# Patient Record
Sex: Male | Born: 1940 | Race: White | Hispanic: No | Marital: Married | Smoking: Former smoker
Health system: Southern US, Community
[De-identification: ages and names within clinical notes are randomized; demographics above are authoritative.]

## PROBLEM LIST (undated history)

## (undated) DIAGNOSIS — N186 End stage renal disease: Secondary | ICD-10-CM

## (undated) DIAGNOSIS — K4090 Unilateral inguinal hernia, without obstruction or gangrene, not specified as recurrent: Secondary | ICD-10-CM

## (undated) DIAGNOSIS — M51369 Other intervertebral disc degeneration, lumbar region without mention of lumbar back pain or lower extremity pain: Secondary | ICD-10-CM

## (undated) DIAGNOSIS — R600 Localized edema: Secondary | ICD-10-CM

## (undated) DIAGNOSIS — I4892 Unspecified atrial flutter: Secondary | ICD-10-CM

## (undated) DIAGNOSIS — M5136 Other intervertebral disc degeneration, lumbar region: Secondary | ICD-10-CM

## (undated) DIAGNOSIS — I10 Essential (primary) hypertension: Secondary | ICD-10-CM

## (undated) DIAGNOSIS — M47816 Spondylosis without myelopathy or radiculopathy, lumbar region: Secondary | ICD-10-CM

## (undated) DIAGNOSIS — Z79899 Other long term (current) drug therapy: Secondary | ICD-10-CM

## (undated) DIAGNOSIS — Z7960 Long term (current) use of unspecified immunomodulators and immunosuppressants: Secondary | ICD-10-CM

## (undated) DIAGNOSIS — E785 Hyperlipidemia, unspecified: Secondary | ICD-10-CM

## (undated) DIAGNOSIS — E119 Type 2 diabetes mellitus without complications: Secondary | ICD-10-CM

## (undated) DIAGNOSIS — R609 Edema, unspecified: Secondary | ICD-10-CM

## (undated) DIAGNOSIS — I639 Cerebral infarction, unspecified: Secondary | ICD-10-CM

## (undated) HISTORY — PX: BACK SURGERY: SHX140

## (undated) HISTORY — PX: CHOLECYSTECTOMY: SHX55

---

## 2001-04-24 ENCOUNTER — Encounter (HOSPITAL_COMMUNITY): Admission: RE | Admit: 2001-04-24 | Discharge: 2001-05-22 | Payer: Self-pay | Admitting: Nephrology

## 2001-05-23 ENCOUNTER — Encounter (HOSPITAL_COMMUNITY): Admission: RE | Admit: 2001-05-23 | Discharge: 2001-06-22 | Payer: Self-pay | Admitting: Nephrology

## 2003-01-24 HISTORY — PX: KIDNEY TRANSPLANT: SHX239

## 2003-03-18 ENCOUNTER — Ambulatory Visit (HOSPITAL_COMMUNITY): Admission: RE | Admit: 2003-03-18 | Discharge: 2003-03-19 | Payer: Self-pay | Admitting: Cardiology

## 2004-11-24 ENCOUNTER — Ambulatory Visit: Payer: Self-pay | Admitting: Cardiology

## 2010-11-24 DIAGNOSIS — R0602 Shortness of breath: Secondary | ICD-10-CM

## 2011-02-01 DIAGNOSIS — N189 Chronic kidney disease, unspecified: Secondary | ICD-10-CM | POA: Diagnosis not present

## 2011-02-24 DIAGNOSIS — Z940 Kidney transplant status: Secondary | ICD-10-CM | POA: Diagnosis not present

## 2011-03-24 DIAGNOSIS — I129 Hypertensive chronic kidney disease with stage 1 through stage 4 chronic kidney disease, or unspecified chronic kidney disease: Secondary | ICD-10-CM | POA: Diagnosis not present

## 2011-03-24 DIAGNOSIS — Z862 Personal history of diseases of the blood and blood-forming organs and certain disorders involving the immune mechanism: Secondary | ICD-10-CM | POA: Diagnosis not present

## 2011-03-24 DIAGNOSIS — Z940 Kidney transplant status: Secondary | ICD-10-CM | POA: Diagnosis not present

## 2011-03-24 DIAGNOSIS — N183 Chronic kidney disease, stage 3 unspecified: Secondary | ICD-10-CM | POA: Diagnosis not present

## 2011-03-24 DIAGNOSIS — E119 Type 2 diabetes mellitus without complications: Secondary | ICD-10-CM | POA: Diagnosis not present

## 2011-03-24 DIAGNOSIS — Z8639 Personal history of other endocrine, nutritional and metabolic disease: Secondary | ICD-10-CM | POA: Diagnosis not present

## 2011-03-24 DIAGNOSIS — N281 Cyst of kidney, acquired: Secondary | ICD-10-CM | POA: Diagnosis not present

## 2011-03-24 DIAGNOSIS — E785 Hyperlipidemia, unspecified: Secondary | ICD-10-CM | POA: Diagnosis not present

## 2011-03-31 DIAGNOSIS — E1149 Type 2 diabetes mellitus with other diabetic neurological complication: Secondary | ICD-10-CM | POA: Diagnosis not present

## 2011-03-31 DIAGNOSIS — B351 Tinea unguium: Secondary | ICD-10-CM | POA: Diagnosis not present

## 2011-05-05 DIAGNOSIS — E782 Mixed hyperlipidemia: Secondary | ICD-10-CM | POA: Diagnosis not present

## 2011-05-05 DIAGNOSIS — G459 Transient cerebral ischemic attack, unspecified: Secondary | ICD-10-CM | POA: Diagnosis not present

## 2011-05-12 DIAGNOSIS — R209 Unspecified disturbances of skin sensation: Secondary | ICD-10-CM | POA: Diagnosis not present

## 2011-05-12 DIAGNOSIS — I6529 Occlusion and stenosis of unspecified carotid artery: Secondary | ICD-10-CM | POA: Diagnosis not present

## 2011-05-12 DIAGNOSIS — R42 Dizziness and giddiness: Secondary | ICD-10-CM | POA: Diagnosis not present

## 2011-05-14 DIAGNOSIS — G459 Transient cerebral ischemic attack, unspecified: Secondary | ICD-10-CM | POA: Diagnosis not present

## 2011-05-14 DIAGNOSIS — I209 Angina pectoris, unspecified: Secondary | ICD-10-CM | POA: Diagnosis present

## 2011-05-14 DIAGNOSIS — I4892 Unspecified atrial flutter: Secondary | ICD-10-CM | POA: Diagnosis not present

## 2011-05-14 DIAGNOSIS — I10 Essential (primary) hypertension: Secondary | ICD-10-CM | POA: Diagnosis not present

## 2011-05-14 DIAGNOSIS — Z794 Long term (current) use of insulin: Secondary | ICD-10-CM | POA: Diagnosis not present

## 2011-05-14 DIAGNOSIS — I2510 Atherosclerotic heart disease of native coronary artery without angina pectoris: Secondary | ICD-10-CM | POA: Diagnosis not present

## 2011-05-14 DIAGNOSIS — R42 Dizziness and giddiness: Secondary | ICD-10-CM | POA: Diagnosis not present

## 2011-05-14 DIAGNOSIS — Z880 Allergy status to penicillin: Secondary | ICD-10-CM | POA: Diagnosis not present

## 2011-05-14 DIAGNOSIS — Z79899 Other long term (current) drug therapy: Secondary | ICD-10-CM | POA: Diagnosis not present

## 2011-05-14 DIAGNOSIS — E785 Hyperlipidemia, unspecified: Secondary | ICD-10-CM | POA: Diagnosis present

## 2011-05-14 DIAGNOSIS — I499 Cardiac arrhythmia, unspecified: Secondary | ICD-10-CM | POA: Diagnosis not present

## 2011-05-14 DIAGNOSIS — Z7982 Long term (current) use of aspirin: Secondary | ICD-10-CM | POA: Diagnosis not present

## 2011-05-14 DIAGNOSIS — G319 Degenerative disease of nervous system, unspecified: Secondary | ICD-10-CM | POA: Diagnosis not present

## 2011-05-14 DIAGNOSIS — Z940 Kidney transplant status: Secondary | ICD-10-CM | POA: Diagnosis not present

## 2011-05-14 DIAGNOSIS — E119 Type 2 diabetes mellitus without complications: Secondary | ICD-10-CM | POA: Diagnosis not present

## 2011-05-14 DIAGNOSIS — Z9861 Coronary angioplasty status: Secondary | ICD-10-CM | POA: Diagnosis not present

## 2011-05-14 DIAGNOSIS — Z8673 Personal history of transient ischemic attack (TIA), and cerebral infarction without residual deficits: Secondary | ICD-10-CM | POA: Diagnosis not present

## 2011-05-14 DIAGNOSIS — I498 Other specified cardiac arrhythmias: Secondary | ICD-10-CM | POA: Diagnosis not present

## 2011-05-17 DIAGNOSIS — I4892 Unspecified atrial flutter: Secondary | ICD-10-CM | POA: Diagnosis not present

## 2011-05-19 DIAGNOSIS — Z7901 Long term (current) use of anticoagulants: Secondary | ICD-10-CM | POA: Diagnosis not present

## 2011-05-26 DIAGNOSIS — I4892 Unspecified atrial flutter: Secondary | ICD-10-CM | POA: Diagnosis not present

## 2011-05-31 DIAGNOSIS — I4892 Unspecified atrial flutter: Secondary | ICD-10-CM | POA: Diagnosis not present

## 2011-05-31 DIAGNOSIS — E785 Hyperlipidemia, unspecified: Secondary | ICD-10-CM | POA: Diagnosis not present

## 2011-05-31 DIAGNOSIS — E119 Type 2 diabetes mellitus without complications: Secondary | ICD-10-CM | POA: Diagnosis not present

## 2011-05-31 DIAGNOSIS — I129 Hypertensive chronic kidney disease with stage 1 through stage 4 chronic kidney disease, or unspecified chronic kidney disease: Secondary | ICD-10-CM | POA: Diagnosis not present

## 2011-06-02 DIAGNOSIS — I4891 Unspecified atrial fibrillation: Secondary | ICD-10-CM | POA: Diagnosis not present

## 2011-06-09 DIAGNOSIS — E1149 Type 2 diabetes mellitus with other diabetic neurological complication: Secondary | ICD-10-CM | POA: Diagnosis not present

## 2011-06-09 DIAGNOSIS — B351 Tinea unguium: Secondary | ICD-10-CM | POA: Diagnosis not present

## 2011-07-10 DIAGNOSIS — E785 Hyperlipidemia, unspecified: Secondary | ICD-10-CM | POA: Diagnosis not present

## 2011-07-10 DIAGNOSIS — I4892 Unspecified atrial flutter: Secondary | ICD-10-CM | POA: Diagnosis not present

## 2011-07-10 DIAGNOSIS — I2510 Atherosclerotic heart disease of native coronary artery without angina pectoris: Secondary | ICD-10-CM | POA: Diagnosis not present

## 2011-07-10 DIAGNOSIS — E119 Type 2 diabetes mellitus without complications: Secondary | ICD-10-CM | POA: Diagnosis not present

## 2011-07-10 DIAGNOSIS — Z940 Kidney transplant status: Secondary | ICD-10-CM | POA: Diagnosis not present

## 2011-07-10 DIAGNOSIS — I129 Hypertensive chronic kidney disease with stage 1 through stage 4 chronic kidney disease, or unspecified chronic kidney disease: Secondary | ICD-10-CM | POA: Diagnosis not present

## 2011-07-10 DIAGNOSIS — Z8673 Personal history of transient ischemic attack (TIA), and cerebral infarction without residual deficits: Secondary | ICD-10-CM | POA: Diagnosis not present

## 2011-08-11 DIAGNOSIS — I10 Essential (primary) hypertension: Secondary | ICD-10-CM | POA: Diagnosis not present

## 2011-09-01 DIAGNOSIS — E1149 Type 2 diabetes mellitus with other diabetic neurological complication: Secondary | ICD-10-CM | POA: Diagnosis not present

## 2011-09-01 DIAGNOSIS — B351 Tinea unguium: Secondary | ICD-10-CM | POA: Diagnosis not present

## 2011-11-17 DIAGNOSIS — B079 Viral wart, unspecified: Secondary | ICD-10-CM | POA: Diagnosis not present

## 2011-11-22 DIAGNOSIS — B351 Tinea unguium: Secondary | ICD-10-CM | POA: Diagnosis not present

## 2011-11-22 DIAGNOSIS — E1149 Type 2 diabetes mellitus with other diabetic neurological complication: Secondary | ICD-10-CM | POA: Diagnosis not present

## 2011-12-04 DIAGNOSIS — N281 Cyst of kidney, acquired: Secondary | ICD-10-CM | POA: Diagnosis not present

## 2011-12-04 DIAGNOSIS — I2510 Atherosclerotic heart disease of native coronary artery without angina pectoris: Secondary | ICD-10-CM | POA: Diagnosis not present

## 2011-12-04 DIAGNOSIS — Z862 Personal history of diseases of the blood and blood-forming organs and certain disorders involving the immune mechanism: Secondary | ICD-10-CM | POA: Diagnosis not present

## 2011-12-04 DIAGNOSIS — E785 Hyperlipidemia, unspecified: Secondary | ICD-10-CM | POA: Diagnosis not present

## 2011-12-04 DIAGNOSIS — Z8673 Personal history of transient ischemic attack (TIA), and cerebral infarction without residual deficits: Secondary | ICD-10-CM | POA: Diagnosis not present

## 2011-12-04 DIAGNOSIS — E119 Type 2 diabetes mellitus without complications: Secondary | ICD-10-CM | POA: Diagnosis not present

## 2011-12-04 DIAGNOSIS — N183 Chronic kidney disease, stage 3 unspecified: Secondary | ICD-10-CM | POA: Diagnosis not present

## 2011-12-04 DIAGNOSIS — I129 Hypertensive chronic kidney disease with stage 1 through stage 4 chronic kidney disease, or unspecified chronic kidney disease: Secondary | ICD-10-CM | POA: Diagnosis not present

## 2011-12-04 DIAGNOSIS — Z79899 Other long term (current) drug therapy: Secondary | ICD-10-CM | POA: Diagnosis not present

## 2011-12-04 DIAGNOSIS — Z940 Kidney transplant status: Secondary | ICD-10-CM | POA: Diagnosis not present

## 2011-12-19 DIAGNOSIS — Z23 Encounter for immunization: Secondary | ICD-10-CM | POA: Diagnosis not present

## 2012-01-16 DIAGNOSIS — Z48298 Encounter for aftercare following other organ transplant: Secondary | ICD-10-CM | POA: Diagnosis not present

## 2012-01-16 DIAGNOSIS — E119 Type 2 diabetes mellitus without complications: Secondary | ICD-10-CM | POA: Diagnosis not present

## 2012-01-16 DIAGNOSIS — I129 Hypertensive chronic kidney disease with stage 1 through stage 4 chronic kidney disease, or unspecified chronic kidney disease: Secondary | ICD-10-CM | POA: Diagnosis not present

## 2012-01-16 DIAGNOSIS — N183 Chronic kidney disease, stage 3 unspecified: Secondary | ICD-10-CM | POA: Diagnosis not present

## 2012-01-16 DIAGNOSIS — Z940 Kidney transplant status: Secondary | ICD-10-CM | POA: Diagnosis not present

## 2012-01-16 DIAGNOSIS — K439 Ventral hernia without obstruction or gangrene: Secondary | ICD-10-CM | POA: Diagnosis not present

## 2012-01-31 DIAGNOSIS — E1149 Type 2 diabetes mellitus with other diabetic neurological complication: Secondary | ICD-10-CM | POA: Diagnosis not present

## 2012-01-31 DIAGNOSIS — B351 Tinea unguium: Secondary | ICD-10-CM | POA: Diagnosis not present

## 2012-02-08 DIAGNOSIS — K432 Incisional hernia without obstruction or gangrene: Secondary | ICD-10-CM | POA: Diagnosis not present

## 2012-03-05 DIAGNOSIS — B351 Tinea unguium: Secondary | ICD-10-CM | POA: Diagnosis not present

## 2012-03-05 DIAGNOSIS — E1149 Type 2 diabetes mellitus with other diabetic neurological complication: Secondary | ICD-10-CM | POA: Diagnosis not present

## 2012-03-20 DIAGNOSIS — Z79899 Other long term (current) drug therapy: Secondary | ICD-10-CM | POA: Diagnosis not present

## 2012-03-20 DIAGNOSIS — I10 Essential (primary) hypertension: Secondary | ICD-10-CM | POA: Diagnosis not present

## 2012-04-04 DIAGNOSIS — Z940 Kidney transplant status: Secondary | ICD-10-CM | POA: Diagnosis not present

## 2012-04-04 DIAGNOSIS — Z862 Personal history of diseases of the blood and blood-forming organs and certain disorders involving the immune mechanism: Secondary | ICD-10-CM | POA: Diagnosis not present

## 2012-04-04 DIAGNOSIS — E119 Type 2 diabetes mellitus without complications: Secondary | ICD-10-CM | POA: Diagnosis not present

## 2012-04-04 DIAGNOSIS — Z8639 Personal history of other endocrine, nutritional and metabolic disease: Secondary | ICD-10-CM | POA: Diagnosis not present

## 2012-04-04 DIAGNOSIS — I129 Hypertensive chronic kidney disease with stage 1 through stage 4 chronic kidney disease, or unspecified chronic kidney disease: Secondary | ICD-10-CM | POA: Diagnosis not present

## 2012-04-04 DIAGNOSIS — N281 Cyst of kidney, acquired: Secondary | ICD-10-CM | POA: Diagnosis not present

## 2012-04-04 DIAGNOSIS — Z79899 Other long term (current) drug therapy: Secondary | ICD-10-CM | POA: Diagnosis not present

## 2012-04-04 DIAGNOSIS — E785 Hyperlipidemia, unspecified: Secondary | ICD-10-CM | POA: Diagnosis not present

## 2012-04-04 DIAGNOSIS — N183 Chronic kidney disease, stage 3 unspecified: Secondary | ICD-10-CM | POA: Diagnosis not present

## 2012-05-06 DIAGNOSIS — Z0000 Encounter for general adult medical examination without abnormal findings: Secondary | ICD-10-CM | POA: Diagnosis not present

## 2012-05-06 DIAGNOSIS — Z125 Encounter for screening for malignant neoplasm of prostate: Secondary | ICD-10-CM | POA: Diagnosis not present

## 2012-06-10 DIAGNOSIS — E1149 Type 2 diabetes mellitus with other diabetic neurological complication: Secondary | ICD-10-CM | POA: Diagnosis not present

## 2012-06-21 DIAGNOSIS — I509 Heart failure, unspecified: Secondary | ICD-10-CM | POA: Diagnosis not present

## 2012-06-26 DIAGNOSIS — I10 Essential (primary) hypertension: Secondary | ICD-10-CM | POA: Diagnosis not present

## 2012-06-26 DIAGNOSIS — M79609 Pain in unspecified limb: Secondary | ICD-10-CM | POA: Diagnosis not present

## 2012-07-04 DIAGNOSIS — I359 Nonrheumatic aortic valve disorder, unspecified: Secondary | ICD-10-CM | POA: Diagnosis not present

## 2012-07-04 DIAGNOSIS — R0609 Other forms of dyspnea: Secondary | ICD-10-CM | POA: Diagnosis not present

## 2012-07-04 DIAGNOSIS — I77819 Aortic ectasia, unspecified site: Secondary | ICD-10-CM | POA: Diagnosis not present

## 2012-07-04 DIAGNOSIS — R0602 Shortness of breath: Secondary | ICD-10-CM | POA: Diagnosis not present

## 2012-07-04 DIAGNOSIS — M79609 Pain in unspecified limb: Secondary | ICD-10-CM | POA: Diagnosis not present

## 2012-07-04 DIAGNOSIS — I998 Other disorder of circulatory system: Secondary | ICD-10-CM | POA: Diagnosis not present

## 2012-08-26 DIAGNOSIS — R5381 Other malaise: Secondary | ICD-10-CM | POA: Diagnosis not present

## 2012-08-26 DIAGNOSIS — R5382 Chronic fatigue, unspecified: Secondary | ICD-10-CM | POA: Diagnosis not present

## 2012-08-26 DIAGNOSIS — I10 Essential (primary) hypertension: Secondary | ICD-10-CM | POA: Diagnosis not present

## 2012-08-26 DIAGNOSIS — R5383 Other fatigue: Secondary | ICD-10-CM | POA: Diagnosis not present

## 2012-08-27 DIAGNOSIS — Z880 Allergy status to penicillin: Secondary | ICD-10-CM | POA: Diagnosis not present

## 2012-08-27 DIAGNOSIS — Z7901 Long term (current) use of anticoagulants: Secondary | ICD-10-CM | POA: Diagnosis not present

## 2012-08-27 DIAGNOSIS — R42 Dizziness and giddiness: Secondary | ICD-10-CM | POA: Diagnosis not present

## 2012-08-27 DIAGNOSIS — E785 Hyperlipidemia, unspecified: Secondary | ICD-10-CM | POA: Diagnosis not present

## 2012-08-27 DIAGNOSIS — I10 Essential (primary) hypertension: Secondary | ICD-10-CM | POA: Diagnosis not present

## 2012-08-27 DIAGNOSIS — Z7982 Long term (current) use of aspirin: Secondary | ICD-10-CM | POA: Diagnosis not present

## 2012-08-27 DIAGNOSIS — Z794 Long term (current) use of insulin: Secondary | ICD-10-CM | POA: Diagnosis not present

## 2012-08-27 DIAGNOSIS — I2510 Atherosclerotic heart disease of native coronary artery without angina pectoris: Secondary | ICD-10-CM | POA: Diagnosis not present

## 2012-08-27 DIAGNOSIS — Z9861 Coronary angioplasty status: Secondary | ICD-10-CM | POA: Diagnosis not present

## 2012-08-27 DIAGNOSIS — E860 Dehydration: Secondary | ICD-10-CM | POA: Diagnosis not present

## 2012-08-27 DIAGNOSIS — Z86718 Personal history of other venous thrombosis and embolism: Secondary | ICD-10-CM | POA: Diagnosis not present

## 2012-08-27 DIAGNOSIS — Z8249 Family history of ischemic heart disease and other diseases of the circulatory system: Secondary | ICD-10-CM | POA: Diagnosis not present

## 2012-08-27 DIAGNOSIS — E119 Type 2 diabetes mellitus without complications: Secondary | ICD-10-CM | POA: Diagnosis not present

## 2012-08-27 DIAGNOSIS — R55 Syncope and collapse: Secondary | ICD-10-CM | POA: Diagnosis not present

## 2012-08-27 DIAGNOSIS — Z940 Kidney transplant status: Secondary | ICD-10-CM | POA: Diagnosis not present

## 2012-08-27 DIAGNOSIS — Z79899 Other long term (current) drug therapy: Secondary | ICD-10-CM | POA: Diagnosis not present

## 2012-08-28 DIAGNOSIS — E119 Type 2 diabetes mellitus without complications: Secondary | ICD-10-CM | POA: Diagnosis not present

## 2012-08-28 DIAGNOSIS — R42 Dizziness and giddiness: Secondary | ICD-10-CM | POA: Diagnosis not present

## 2012-08-28 DIAGNOSIS — I10 Essential (primary) hypertension: Secondary | ICD-10-CM | POA: Diagnosis not present

## 2012-08-28 DIAGNOSIS — E785 Hyperlipidemia, unspecified: Secondary | ICD-10-CM | POA: Diagnosis not present

## 2012-08-28 DIAGNOSIS — I2510 Atherosclerotic heart disease of native coronary artery without angina pectoris: Secondary | ICD-10-CM | POA: Diagnosis not present

## 2012-08-28 DIAGNOSIS — E860 Dehydration: Secondary | ICD-10-CM | POA: Diagnosis not present

## 2012-08-28 DIAGNOSIS — Z86718 Personal history of other venous thrombosis and embolism: Secondary | ICD-10-CM | POA: Diagnosis not present

## 2012-09-02 DIAGNOSIS — N039 Chronic nephritic syndrome with unspecified morphologic changes: Secondary | ICD-10-CM | POA: Diagnosis not present

## 2012-09-02 DIAGNOSIS — N2581 Secondary hyperparathyroidism of renal origin: Secondary | ICD-10-CM | POA: Diagnosis not present

## 2012-09-02 DIAGNOSIS — N183 Chronic kidney disease, stage 3 unspecified: Secondary | ICD-10-CM | POA: Diagnosis not present

## 2012-09-02 DIAGNOSIS — E785 Hyperlipidemia, unspecified: Secondary | ICD-10-CM | POA: Diagnosis not present

## 2012-09-02 DIAGNOSIS — I129 Hypertensive chronic kidney disease with stage 1 through stage 4 chronic kidney disease, or unspecified chronic kidney disease: Secondary | ICD-10-CM | POA: Diagnosis not present

## 2012-09-02 DIAGNOSIS — D631 Anemia in chronic kidney disease: Secondary | ICD-10-CM | POA: Diagnosis not present

## 2012-09-02 DIAGNOSIS — E119 Type 2 diabetes mellitus without complications: Secondary | ICD-10-CM | POA: Diagnosis not present

## 2012-09-02 DIAGNOSIS — Z79899 Other long term (current) drug therapy: Secondary | ICD-10-CM | POA: Diagnosis not present

## 2012-09-02 DIAGNOSIS — N281 Cyst of kidney, acquired: Secondary | ICD-10-CM | POA: Diagnosis not present

## 2012-09-02 DIAGNOSIS — Z940 Kidney transplant status: Secondary | ICD-10-CM | POA: Diagnosis not present

## 2012-09-12 DIAGNOSIS — I10 Essential (primary) hypertension: Secondary | ICD-10-CM | POA: Diagnosis not present

## 2012-11-04 DIAGNOSIS — I10 Essential (primary) hypertension: Secondary | ICD-10-CM | POA: Diagnosis not present

## 2012-11-04 DIAGNOSIS — B372 Candidiasis of skin and nail: Secondary | ICD-10-CM | POA: Diagnosis not present

## 2012-11-13 DIAGNOSIS — M47817 Spondylosis without myelopathy or radiculopathy, lumbosacral region: Secondary | ICD-10-CM | POA: Diagnosis not present

## 2012-11-13 DIAGNOSIS — Z981 Arthrodesis status: Secondary | ICD-10-CM | POA: Diagnosis not present

## 2012-11-13 DIAGNOSIS — M4120 Other idiopathic scoliosis, site unspecified: Secondary | ICD-10-CM | POA: Diagnosis not present

## 2013-01-01 DIAGNOSIS — S4980XA Other specified injuries of shoulder and upper arm, unspecified arm, initial encounter: Secondary | ICD-10-CM | POA: Diagnosis not present

## 2013-01-01 DIAGNOSIS — G319 Degenerative disease of nervous system, unspecified: Secondary | ICD-10-CM | POA: Diagnosis not present

## 2013-01-01 DIAGNOSIS — W108XXA Fall (on) (from) other stairs and steps, initial encounter: Secondary | ICD-10-CM | POA: Diagnosis not present

## 2013-01-01 DIAGNOSIS — M5030 Other cervical disc degeneration, unspecified cervical region: Secondary | ICD-10-CM | POA: Diagnosis not present

## 2013-01-01 DIAGNOSIS — S0993XA Unspecified injury of face, initial encounter: Secondary | ICD-10-CM | POA: Diagnosis not present

## 2013-01-01 DIAGNOSIS — M79609 Pain in unspecified limb: Secondary | ICD-10-CM | POA: Diagnosis not present

## 2013-01-01 DIAGNOSIS — S3981XA Other specified injuries of abdomen, initial encounter: Secondary | ICD-10-CM | POA: Diagnosis not present

## 2013-01-01 DIAGNOSIS — S2249XA Multiple fractures of ribs, unspecified side, initial encounter for closed fracture: Secondary | ICD-10-CM | POA: Diagnosis not present

## 2013-01-01 DIAGNOSIS — S0990XA Unspecified injury of head, initial encounter: Secondary | ICD-10-CM | POA: Diagnosis not present

## 2013-01-01 DIAGNOSIS — I10 Essential (primary) hypertension: Secondary | ICD-10-CM | POA: Diagnosis not present

## 2013-01-01 DIAGNOSIS — Z87891 Personal history of nicotine dependence: Secondary | ICD-10-CM | POA: Diagnosis not present

## 2013-01-01 DIAGNOSIS — Z940 Kidney transplant status: Secondary | ICD-10-CM | POA: Diagnosis not present

## 2013-01-01 DIAGNOSIS — Z8249 Family history of ischemic heart disease and other diseases of the circulatory system: Secondary | ICD-10-CM | POA: Diagnosis not present

## 2013-01-01 DIAGNOSIS — Z833 Family history of diabetes mellitus: Secondary | ICD-10-CM | POA: Diagnosis not present

## 2013-01-01 DIAGNOSIS — R6889 Other general symptoms and signs: Secondary | ICD-10-CM | POA: Diagnosis not present

## 2013-01-01 DIAGNOSIS — S59909A Unspecified injury of unspecified elbow, initial encounter: Secondary | ICD-10-CM | POA: Diagnosis not present

## 2013-01-01 DIAGNOSIS — R109 Unspecified abdominal pain: Secondary | ICD-10-CM | POA: Diagnosis not present

## 2013-01-01 DIAGNOSIS — Z794 Long term (current) use of insulin: Secondary | ICD-10-CM | POA: Diagnosis not present

## 2013-01-01 DIAGNOSIS — E119 Type 2 diabetes mellitus without complications: Secondary | ICD-10-CM | POA: Diagnosis not present

## 2013-01-01 DIAGNOSIS — IMO0002 Reserved for concepts with insufficient information to code with codable children: Secondary | ICD-10-CM | POA: Diagnosis not present

## 2013-01-01 DIAGNOSIS — Z79899 Other long term (current) drug therapy: Secondary | ICD-10-CM | POA: Diagnosis not present

## 2013-01-01 DIAGNOSIS — Z8673 Personal history of transient ischemic attack (TIA), and cerebral infarction without residual deficits: Secondary | ICD-10-CM | POA: Diagnosis not present

## 2013-01-01 DIAGNOSIS — M25519 Pain in unspecified shoulder: Secondary | ICD-10-CM | POA: Diagnosis not present

## 2013-01-01 DIAGNOSIS — Z7982 Long term (current) use of aspirin: Secondary | ICD-10-CM | POA: Diagnosis not present

## 2013-01-01 DIAGNOSIS — M542 Cervicalgia: Secondary | ICD-10-CM | POA: Diagnosis not present

## 2013-01-01 DIAGNOSIS — I2510 Atherosclerotic heart disease of native coronary artery without angina pectoris: Secondary | ICD-10-CM | POA: Diagnosis not present

## 2013-01-02 DIAGNOSIS — S2249XA Multiple fractures of ribs, unspecified side, initial encounter for closed fracture: Secondary | ICD-10-CM | POA: Diagnosis not present

## 2013-01-09 DIAGNOSIS — S20219A Contusion of unspecified front wall of thorax, initial encounter: Secondary | ICD-10-CM | POA: Diagnosis not present

## 2013-02-04 DIAGNOSIS — E1149 Type 2 diabetes mellitus with other diabetic neurological complication: Secondary | ICD-10-CM | POA: Diagnosis not present

## 2013-02-06 DIAGNOSIS — N183 Chronic kidney disease, stage 3 unspecified: Secondary | ICD-10-CM | POA: Diagnosis not present

## 2013-02-06 DIAGNOSIS — E119 Type 2 diabetes mellitus without complications: Secondary | ICD-10-CM | POA: Diagnosis not present

## 2013-02-06 DIAGNOSIS — Z940 Kidney transplant status: Secondary | ICD-10-CM | POA: Diagnosis not present

## 2013-02-06 DIAGNOSIS — Z79899 Other long term (current) drug therapy: Secondary | ICD-10-CM | POA: Diagnosis not present

## 2013-02-06 DIAGNOSIS — Z862 Personal history of diseases of the blood and blood-forming organs and certain disorders involving the immune mechanism: Secondary | ICD-10-CM | POA: Diagnosis not present

## 2013-02-06 DIAGNOSIS — E785 Hyperlipidemia, unspecified: Secondary | ICD-10-CM | POA: Diagnosis not present

## 2013-02-06 DIAGNOSIS — I129 Hypertensive chronic kidney disease with stage 1 through stage 4 chronic kidney disease, or unspecified chronic kidney disease: Secondary | ICD-10-CM | POA: Diagnosis not present

## 2013-02-06 DIAGNOSIS — N281 Cyst of kidney, acquired: Secondary | ICD-10-CM | POA: Diagnosis not present

## 2013-02-14 DIAGNOSIS — E785 Hyperlipidemia, unspecified: Secondary | ICD-10-CM | POA: Diagnosis not present

## 2013-02-14 DIAGNOSIS — Z79899 Other long term (current) drug therapy: Secondary | ICD-10-CM | POA: Diagnosis not present

## 2013-02-14 DIAGNOSIS — Z9889 Other specified postprocedural states: Secondary | ICD-10-CM | POA: Diagnosis not present

## 2013-02-14 DIAGNOSIS — Z794 Long term (current) use of insulin: Secondary | ICD-10-CM | POA: Diagnosis not present

## 2013-02-14 DIAGNOSIS — E119 Type 2 diabetes mellitus without complications: Secondary | ICD-10-CM | POA: Diagnosis not present

## 2013-02-14 DIAGNOSIS — Z8673 Personal history of transient ischemic attack (TIA), and cerebral infarction without residual deficits: Secondary | ICD-10-CM | POA: Diagnosis not present

## 2013-02-14 DIAGNOSIS — N186 End stage renal disease: Secondary | ICD-10-CM | POA: Diagnosis not present

## 2013-02-14 DIAGNOSIS — I2510 Atherosclerotic heart disease of native coronary artery without angina pectoris: Secondary | ICD-10-CM | POA: Diagnosis not present

## 2013-02-14 DIAGNOSIS — N189 Chronic kidney disease, unspecified: Secondary | ICD-10-CM | POA: Diagnosis not present

## 2013-02-14 DIAGNOSIS — I120 Hypertensive chronic kidney disease with stage 5 chronic kidney disease or end stage renal disease: Secondary | ICD-10-CM | POA: Diagnosis not present

## 2013-02-14 DIAGNOSIS — Z940 Kidney transplant status: Secondary | ICD-10-CM | POA: Diagnosis not present

## 2013-02-14 DIAGNOSIS — D899 Disorder involving the immune mechanism, unspecified: Secondary | ICD-10-CM | POA: Diagnosis not present

## 2013-02-14 DIAGNOSIS — Z7982 Long term (current) use of aspirin: Secondary | ICD-10-CM | POA: Diagnosis not present

## 2013-02-14 DIAGNOSIS — Z9861 Coronary angioplasty status: Secondary | ICD-10-CM | POA: Diagnosis not present

## 2013-02-18 DIAGNOSIS — Z940 Kidney transplant status: Secondary | ICD-10-CM | POA: Diagnosis not present

## 2013-02-18 DIAGNOSIS — E1129 Type 2 diabetes mellitus with other diabetic kidney complication: Secondary | ICD-10-CM | POA: Diagnosis not present

## 2013-02-18 DIAGNOSIS — I10 Essential (primary) hypertension: Secondary | ICD-10-CM | POA: Diagnosis not present

## 2013-02-18 DIAGNOSIS — Z7982 Long term (current) use of aspirin: Secondary | ICD-10-CM | POA: Diagnosis not present

## 2013-02-18 DIAGNOSIS — IMO0002 Reserved for concepts with insufficient information to code with codable children: Secondary | ICD-10-CM | POA: Diagnosis not present

## 2013-02-18 DIAGNOSIS — Z880 Allergy status to penicillin: Secondary | ICD-10-CM | POA: Diagnosis not present

## 2013-02-18 DIAGNOSIS — Z79899 Other long term (current) drug therapy: Secondary | ICD-10-CM | POA: Diagnosis not present

## 2013-02-18 DIAGNOSIS — E119 Type 2 diabetes mellitus without complications: Secondary | ICD-10-CM | POA: Diagnosis not present

## 2013-02-18 DIAGNOSIS — Z9109 Other allergy status, other than to drugs and biological substances: Secondary | ICD-10-CM | POA: Diagnosis not present

## 2013-02-18 DIAGNOSIS — Z8673 Personal history of transient ischemic attack (TIA), and cerebral infarction without residual deficits: Secondary | ICD-10-CM | POA: Diagnosis not present

## 2013-02-18 DIAGNOSIS — N049 Nephrotic syndrome with unspecified morphologic changes: Secondary | ICD-10-CM | POA: Diagnosis not present

## 2013-02-18 DIAGNOSIS — Z87891 Personal history of nicotine dependence: Secondary | ICD-10-CM | POA: Diagnosis not present

## 2013-02-18 DIAGNOSIS — Z5181 Encounter for therapeutic drug level monitoring: Secondary | ICD-10-CM | POA: Diagnosis not present

## 2013-02-18 DIAGNOSIS — Z794 Long term (current) use of insulin: Secondary | ICD-10-CM | POA: Diagnosis not present

## 2013-02-18 DIAGNOSIS — Z48298 Encounter for aftercare following other organ transplant: Secondary | ICD-10-CM | POA: Diagnosis not present

## 2013-04-15 DIAGNOSIS — B351 Tinea unguium: Secondary | ICD-10-CM | POA: Diagnosis not present

## 2013-04-15 DIAGNOSIS — E1149 Type 2 diabetes mellitus with other diabetic neurological complication: Secondary | ICD-10-CM | POA: Diagnosis not present

## 2013-04-15 DIAGNOSIS — L851 Acquired keratosis [keratoderma] palmaris et plantaris: Secondary | ICD-10-CM | POA: Diagnosis not present

## 2013-04-21 DIAGNOSIS — Z940 Kidney transplant status: Secondary | ICD-10-CM | POA: Diagnosis not present

## 2013-04-21 DIAGNOSIS — N179 Acute kidney failure, unspecified: Secondary | ICD-10-CM | POA: Diagnosis not present

## 2013-04-21 DIAGNOSIS — R109 Unspecified abdominal pain: Secondary | ICD-10-CM | POA: Diagnosis not present

## 2013-04-21 DIAGNOSIS — A4152 Sepsis due to Pseudomonas: Secondary | ICD-10-CM | POA: Diagnosis not present

## 2013-04-21 DIAGNOSIS — N186 End stage renal disease: Secondary | ICD-10-CM | POA: Diagnosis not present

## 2013-04-21 DIAGNOSIS — Z794 Long term (current) use of insulin: Secondary | ICD-10-CM | POA: Diagnosis not present

## 2013-04-21 DIAGNOSIS — K432 Incisional hernia without obstruction or gangrene: Secondary | ICD-10-CM | POA: Diagnosis present

## 2013-04-21 DIAGNOSIS — E46 Unspecified protein-calorie malnutrition: Secondary | ICD-10-CM | POA: Diagnosis not present

## 2013-04-21 DIAGNOSIS — R0602 Shortness of breath: Secondary | ICD-10-CM | POA: Diagnosis not present

## 2013-04-21 DIAGNOSIS — G0490 Encephalitis and encephalomyelitis, unspecified: Secondary | ICD-10-CM | POA: Diagnosis not present

## 2013-04-21 DIAGNOSIS — Z5189 Encounter for other specified aftercare: Secondary | ICD-10-CM | POA: Diagnosis not present

## 2013-04-21 DIAGNOSIS — R9431 Abnormal electrocardiogram [ECG] [EKG]: Secondary | ICD-10-CM | POA: Diagnosis not present

## 2013-04-21 DIAGNOSIS — I498 Other specified cardiac arrhythmias: Secondary | ICD-10-CM | POA: Diagnosis not present

## 2013-04-21 DIAGNOSIS — E872 Acidosis, unspecified: Secondary | ICD-10-CM | POA: Diagnosis not present

## 2013-04-21 DIAGNOSIS — E559 Vitamin D deficiency, unspecified: Secondary | ICD-10-CM | POA: Diagnosis not present

## 2013-04-21 DIAGNOSIS — Z87891 Personal history of nicotine dependence: Secondary | ICD-10-CM | POA: Diagnosis not present

## 2013-04-21 DIAGNOSIS — Z79899 Other long term (current) drug therapy: Secondary | ICD-10-CM | POA: Diagnosis not present

## 2013-04-21 DIAGNOSIS — I2510 Atherosclerotic heart disease of native coronary artery without angina pectoris: Secondary | ICD-10-CM | POA: Diagnosis not present

## 2013-04-21 DIAGNOSIS — N183 Chronic kidney disease, stage 3 unspecified: Secondary | ICD-10-CM | POA: Diagnosis not present

## 2013-04-21 DIAGNOSIS — Z7982 Long term (current) use of aspirin: Secondary | ICD-10-CM | POA: Diagnosis not present

## 2013-04-21 DIAGNOSIS — R4182 Altered mental status, unspecified: Secondary | ICD-10-CM | POA: Diagnosis not present

## 2013-04-21 DIAGNOSIS — D649 Anemia, unspecified: Secondary | ICD-10-CM | POA: Diagnosis not present

## 2013-04-21 DIAGNOSIS — A4150 Gram-negative sepsis, unspecified: Secondary | ICD-10-CM | POA: Diagnosis not present

## 2013-04-21 DIAGNOSIS — R569 Unspecified convulsions: Secondary | ICD-10-CM | POA: Diagnosis not present

## 2013-04-21 DIAGNOSIS — R339 Retention of urine, unspecified: Secondary | ICD-10-CM | POA: Diagnosis not present

## 2013-04-21 DIAGNOSIS — I10 Essential (primary) hypertension: Secondary | ICD-10-CM | POA: Diagnosis not present

## 2013-04-21 DIAGNOSIS — N289 Disorder of kidney and ureter, unspecified: Secondary | ICD-10-CM | POA: Diagnosis not present

## 2013-04-21 DIAGNOSIS — N058 Unspecified nephritic syndrome with other morphologic changes: Secondary | ICD-10-CM | POA: Diagnosis not present

## 2013-04-21 DIAGNOSIS — E8779 Other fluid overload: Secondary | ICD-10-CM | POA: Diagnosis not present

## 2013-04-21 DIAGNOSIS — G039 Meningitis, unspecified: Secondary | ICD-10-CM | POA: Diagnosis not present

## 2013-04-21 DIAGNOSIS — N170 Acute kidney failure with tubular necrosis: Secondary | ICD-10-CM | POA: Diagnosis not present

## 2013-04-21 DIAGNOSIS — J811 Chronic pulmonary edema: Secondary | ICD-10-CM | POA: Diagnosis not present

## 2013-04-21 DIAGNOSIS — G9340 Encephalopathy, unspecified: Secondary | ICD-10-CM | POA: Diagnosis not present

## 2013-04-21 DIAGNOSIS — R279 Unspecified lack of coordination: Secondary | ICD-10-CM | POA: Diagnosis not present

## 2013-04-21 DIAGNOSIS — B9689 Other specified bacterial agents as the cause of diseases classified elsewhere: Secondary | ICD-10-CM | POA: Diagnosis not present

## 2013-04-21 DIAGNOSIS — R269 Unspecified abnormalities of gait and mobility: Secondary | ICD-10-CM | POA: Diagnosis not present

## 2013-04-21 DIAGNOSIS — I440 Atrioventricular block, first degree: Secondary | ICD-10-CM | POA: Diagnosis not present

## 2013-04-21 DIAGNOSIS — H548 Legal blindness, as defined in USA: Secondary | ICD-10-CM | POA: Diagnosis present

## 2013-04-21 DIAGNOSIS — E119 Type 2 diabetes mellitus without complications: Secondary | ICD-10-CM | POA: Diagnosis not present

## 2013-04-21 DIAGNOSIS — R7881 Bacteremia: Secondary | ICD-10-CM | POA: Diagnosis not present

## 2013-04-21 DIAGNOSIS — N269 Renal sclerosis, unspecified: Secondary | ICD-10-CM | POA: Diagnosis not present

## 2013-04-21 DIAGNOSIS — M62838 Other muscle spasm: Secondary | ICD-10-CM | POA: Diagnosis not present

## 2013-04-21 DIAGNOSIS — M6281 Muscle weakness (generalized): Secondary | ICD-10-CM | POA: Diagnosis not present

## 2013-04-21 DIAGNOSIS — T8610 Unspecified complication of kidney transplant: Secondary | ICD-10-CM | POA: Diagnosis not present

## 2013-04-21 DIAGNOSIS — Z8673 Personal history of transient ischemic attack (TIA), and cerebral infarction without residual deficits: Secondary | ICD-10-CM | POA: Diagnosis not present

## 2013-04-21 DIAGNOSIS — Z992 Dependence on renal dialysis: Secondary | ICD-10-CM | POA: Diagnosis not present

## 2013-04-21 DIAGNOSIS — R404 Transient alteration of awareness: Secondary | ICD-10-CM | POA: Diagnosis not present

## 2013-04-21 DIAGNOSIS — I700 Atherosclerosis of aorta: Secondary | ICD-10-CM | POA: Diagnosis not present

## 2013-04-21 DIAGNOSIS — E785 Hyperlipidemia, unspecified: Secondary | ICD-10-CM | POA: Diagnosis present

## 2013-04-21 DIAGNOSIS — R259 Unspecified abnormal involuntary movements: Secondary | ICD-10-CM | POA: Diagnosis not present

## 2013-04-21 DIAGNOSIS — I491 Atrial premature depolarization: Secondary | ICD-10-CM | POA: Diagnosis not present

## 2013-04-21 DIAGNOSIS — N189 Chronic kidney disease, unspecified: Secondary | ICD-10-CM | POA: Diagnosis not present

## 2013-04-21 DIAGNOSIS — I129 Hypertensive chronic kidney disease with stage 1 through stage 4 chronic kidney disease, or unspecified chronic kidney disease: Secondary | ICD-10-CM | POA: Diagnosis not present

## 2013-04-21 DIAGNOSIS — I6789 Other cerebrovascular disease: Secondary | ICD-10-CM | POA: Diagnosis not present

## 2013-04-21 DIAGNOSIS — A419 Sepsis, unspecified organism: Secondary | ICD-10-CM | POA: Diagnosis not present

## 2013-04-21 DIAGNOSIS — R29818 Other symptoms and signs involving the nervous system: Secondary | ICD-10-CM | POA: Diagnosis not present

## 2013-04-21 DIAGNOSIS — R4701 Aphasia: Secondary | ICD-10-CM | POA: Diagnosis not present

## 2013-04-21 DIAGNOSIS — N19 Unspecified kidney failure: Secondary | ICD-10-CM | POA: Diagnosis not present

## 2013-05-13 DIAGNOSIS — R279 Unspecified lack of coordination: Secondary | ICD-10-CM | POA: Diagnosis not present

## 2013-05-13 DIAGNOSIS — A4152 Sepsis due to Pseudomonas: Secondary | ICD-10-CM | POA: Diagnosis not present

## 2013-05-13 DIAGNOSIS — I10 Essential (primary) hypertension: Secondary | ICD-10-CM | POA: Diagnosis not present

## 2013-05-13 DIAGNOSIS — N183 Chronic kidney disease, stage 3 unspecified: Secondary | ICD-10-CM | POA: Diagnosis not present

## 2013-05-13 DIAGNOSIS — N19 Unspecified kidney failure: Secondary | ICD-10-CM | POA: Diagnosis not present

## 2013-05-13 DIAGNOSIS — E785 Hyperlipidemia, unspecified: Secondary | ICD-10-CM | POA: Diagnosis not present

## 2013-05-13 DIAGNOSIS — Z940 Kidney transplant status: Secondary | ICD-10-CM | POA: Diagnosis not present

## 2013-05-13 DIAGNOSIS — M6281 Muscle weakness (generalized): Secondary | ICD-10-CM | POA: Diagnosis not present

## 2013-05-13 DIAGNOSIS — I129 Hypertensive chronic kidney disease with stage 1 through stage 4 chronic kidney disease, or unspecified chronic kidney disease: Secondary | ICD-10-CM | POA: Diagnosis not present

## 2013-05-13 DIAGNOSIS — A419 Sepsis, unspecified organism: Secondary | ICD-10-CM | POA: Diagnosis not present

## 2013-05-13 DIAGNOSIS — Z5189 Encounter for other specified aftercare: Secondary | ICD-10-CM | POA: Diagnosis not present

## 2013-05-13 DIAGNOSIS — N289 Disorder of kidney and ureter, unspecified: Secondary | ICD-10-CM | POA: Diagnosis not present

## 2013-05-13 DIAGNOSIS — E119 Type 2 diabetes mellitus without complications: Secondary | ICD-10-CM | POA: Diagnosis not present

## 2013-05-13 DIAGNOSIS — R269 Unspecified abnormalities of gait and mobility: Secondary | ICD-10-CM | POA: Diagnosis not present

## 2013-05-13 DIAGNOSIS — Z992 Dependence on renal dialysis: Secondary | ICD-10-CM | POA: Diagnosis not present

## 2013-05-13 DIAGNOSIS — Z48298 Encounter for aftercare following other organ transplant: Secondary | ICD-10-CM | POA: Diagnosis not present

## 2013-05-13 DIAGNOSIS — I2510 Atherosclerotic heart disease of native coronary artery without angina pectoris: Secondary | ICD-10-CM | POA: Diagnosis not present

## 2013-05-13 DIAGNOSIS — N186 End stage renal disease: Secondary | ICD-10-CM | POA: Diagnosis not present

## 2013-05-13 DIAGNOSIS — Z862 Personal history of diseases of the blood and blood-forming organs and certain disorders involving the immune mechanism: Secondary | ICD-10-CM | POA: Diagnosis not present

## 2013-05-13 DIAGNOSIS — R404 Transient alteration of awareness: Secondary | ICD-10-CM | POA: Diagnosis not present

## 2013-05-13 DIAGNOSIS — N179 Acute kidney failure, unspecified: Secondary | ICD-10-CM | POA: Diagnosis not present

## 2013-05-14 DIAGNOSIS — N179 Acute kidney failure, unspecified: Secondary | ICD-10-CM | POA: Diagnosis not present

## 2013-05-14 DIAGNOSIS — A4152 Sepsis due to Pseudomonas: Secondary | ICD-10-CM | POA: Diagnosis not present

## 2013-05-14 DIAGNOSIS — E119 Type 2 diabetes mellitus without complications: Secondary | ICD-10-CM | POA: Diagnosis not present

## 2013-05-14 DIAGNOSIS — Z940 Kidney transplant status: Secondary | ICD-10-CM | POA: Diagnosis not present

## 2013-05-22 DIAGNOSIS — I10 Essential (primary) hypertension: Secondary | ICD-10-CM | POA: Diagnosis not present

## 2013-05-22 DIAGNOSIS — A419 Sepsis, unspecified organism: Secondary | ICD-10-CM | POA: Diagnosis not present

## 2013-05-22 DIAGNOSIS — N179 Acute kidney failure, unspecified: Secondary | ICD-10-CM | POA: Diagnosis not present

## 2013-05-22 DIAGNOSIS — A4152 Sepsis due to Pseudomonas: Secondary | ICD-10-CM | POA: Diagnosis not present

## 2013-05-25 DIAGNOSIS — R262 Difficulty in walking, not elsewhere classified: Secondary | ICD-10-CM | POA: Diagnosis not present

## 2013-05-25 DIAGNOSIS — I2510 Atherosclerotic heart disease of native coronary artery without angina pectoris: Secondary | ICD-10-CM | POA: Diagnosis not present

## 2013-05-25 DIAGNOSIS — I10 Essential (primary) hypertension: Secondary | ICD-10-CM | POA: Diagnosis not present

## 2013-05-25 DIAGNOSIS — E119 Type 2 diabetes mellitus without complications: Secondary | ICD-10-CM | POA: Diagnosis not present

## 2013-05-25 DIAGNOSIS — N390 Urinary tract infection, site not specified: Secondary | ICD-10-CM | POA: Diagnosis not present

## 2013-05-27 DIAGNOSIS — I10 Essential (primary) hypertension: Secondary | ICD-10-CM | POA: Diagnosis not present

## 2013-05-27 DIAGNOSIS — R262 Difficulty in walking, not elsewhere classified: Secondary | ICD-10-CM | POA: Diagnosis not present

## 2013-05-27 DIAGNOSIS — E119 Type 2 diabetes mellitus without complications: Secondary | ICD-10-CM | POA: Diagnosis not present

## 2013-05-27 DIAGNOSIS — N390 Urinary tract infection, site not specified: Secondary | ICD-10-CM | POA: Diagnosis not present

## 2013-05-27 DIAGNOSIS — I2510 Atherosclerotic heart disease of native coronary artery without angina pectoris: Secondary | ICD-10-CM | POA: Diagnosis not present

## 2013-05-28 DIAGNOSIS — I10 Essential (primary) hypertension: Secondary | ICD-10-CM | POA: Diagnosis not present

## 2013-05-28 DIAGNOSIS — T50995A Adverse effect of other drugs, medicaments and biological substances, initial encounter: Secondary | ICD-10-CM | POA: Diagnosis not present

## 2013-05-28 DIAGNOSIS — I2510 Atherosclerotic heart disease of native coronary artery without angina pectoris: Secondary | ICD-10-CM | POA: Diagnosis not present

## 2013-05-28 DIAGNOSIS — R262 Difficulty in walking, not elsewhere classified: Secondary | ICD-10-CM | POA: Diagnosis not present

## 2013-05-28 DIAGNOSIS — E119 Type 2 diabetes mellitus without complications: Secondary | ICD-10-CM | POA: Diagnosis not present

## 2013-05-28 DIAGNOSIS — N189 Chronic kidney disease, unspecified: Secondary | ICD-10-CM | POA: Diagnosis not present

## 2013-05-28 DIAGNOSIS — N390 Urinary tract infection, site not specified: Secondary | ICD-10-CM | POA: Diagnosis not present

## 2013-05-30 DIAGNOSIS — R262 Difficulty in walking, not elsewhere classified: Secondary | ICD-10-CM | POA: Diagnosis not present

## 2013-05-30 DIAGNOSIS — I2510 Atherosclerotic heart disease of native coronary artery without angina pectoris: Secondary | ICD-10-CM | POA: Diagnosis not present

## 2013-05-30 DIAGNOSIS — I10 Essential (primary) hypertension: Secondary | ICD-10-CM | POA: Diagnosis not present

## 2013-05-30 DIAGNOSIS — E119 Type 2 diabetes mellitus without complications: Secondary | ICD-10-CM | POA: Diagnosis not present

## 2013-05-30 DIAGNOSIS — N390 Urinary tract infection, site not specified: Secondary | ICD-10-CM | POA: Diagnosis not present

## 2013-06-02 DIAGNOSIS — R262 Difficulty in walking, not elsewhere classified: Secondary | ICD-10-CM | POA: Diagnosis not present

## 2013-06-02 DIAGNOSIS — I10 Essential (primary) hypertension: Secondary | ICD-10-CM | POA: Diagnosis not present

## 2013-06-02 DIAGNOSIS — E119 Type 2 diabetes mellitus without complications: Secondary | ICD-10-CM | POA: Diagnosis not present

## 2013-06-02 DIAGNOSIS — I2510 Atherosclerotic heart disease of native coronary artery without angina pectoris: Secondary | ICD-10-CM | POA: Diagnosis not present

## 2013-06-02 DIAGNOSIS — N390 Urinary tract infection, site not specified: Secondary | ICD-10-CM | POA: Diagnosis not present

## 2013-06-03 DIAGNOSIS — Z0000 Encounter for general adult medical examination without abnormal findings: Secondary | ICD-10-CM | POA: Diagnosis not present

## 2013-06-03 DIAGNOSIS — J189 Pneumonia, unspecified organism: Secondary | ICD-10-CM | POA: Diagnosis not present

## 2013-06-03 DIAGNOSIS — N390 Urinary tract infection, site not specified: Secondary | ICD-10-CM | POA: Diagnosis not present

## 2013-06-04 DIAGNOSIS — I10 Essential (primary) hypertension: Secondary | ICD-10-CM | POA: Diagnosis not present

## 2013-06-04 DIAGNOSIS — E119 Type 2 diabetes mellitus without complications: Secondary | ICD-10-CM | POA: Diagnosis not present

## 2013-06-04 DIAGNOSIS — I2510 Atherosclerotic heart disease of native coronary artery without angina pectoris: Secondary | ICD-10-CM | POA: Diagnosis not present

## 2013-06-04 DIAGNOSIS — N390 Urinary tract infection, site not specified: Secondary | ICD-10-CM | POA: Diagnosis not present

## 2013-06-04 DIAGNOSIS — R262 Difficulty in walking, not elsewhere classified: Secondary | ICD-10-CM | POA: Diagnosis not present

## 2013-06-09 DIAGNOSIS — Z8639 Personal history of other endocrine, nutritional and metabolic disease: Secondary | ICD-10-CM | POA: Diagnosis not present

## 2013-06-09 DIAGNOSIS — N390 Urinary tract infection, site not specified: Secondary | ICD-10-CM | POA: Diagnosis not present

## 2013-06-09 DIAGNOSIS — N184 Chronic kidney disease, stage 4 (severe): Secondary | ICD-10-CM | POA: Diagnosis not present

## 2013-06-09 DIAGNOSIS — R262 Difficulty in walking, not elsewhere classified: Secondary | ICD-10-CM | POA: Diagnosis not present

## 2013-06-09 DIAGNOSIS — I10 Essential (primary) hypertension: Secondary | ICD-10-CM | POA: Diagnosis not present

## 2013-06-09 DIAGNOSIS — I2510 Atherosclerotic heart disease of native coronary artery without angina pectoris: Secondary | ICD-10-CM | POA: Diagnosis not present

## 2013-06-09 DIAGNOSIS — I129 Hypertensive chronic kidney disease with stage 1 through stage 4 chronic kidney disease, or unspecified chronic kidney disease: Secondary | ICD-10-CM | POA: Diagnosis not present

## 2013-06-09 DIAGNOSIS — Z862 Personal history of diseases of the blood and blood-forming organs and certain disorders involving the immune mechanism: Secondary | ICD-10-CM | POA: Diagnosis not present

## 2013-06-09 DIAGNOSIS — E119 Type 2 diabetes mellitus without complications: Secondary | ICD-10-CM | POA: Diagnosis not present

## 2013-06-09 DIAGNOSIS — Z940 Kidney transplant status: Secondary | ICD-10-CM | POA: Diagnosis not present

## 2013-06-09 DIAGNOSIS — E785 Hyperlipidemia, unspecified: Secondary | ICD-10-CM | POA: Diagnosis not present

## 2013-06-09 DIAGNOSIS — Z79899 Other long term (current) drug therapy: Secondary | ICD-10-CM | POA: Diagnosis not present

## 2013-06-11 DIAGNOSIS — N390 Urinary tract infection, site not specified: Secondary | ICD-10-CM | POA: Diagnosis not present

## 2013-06-11 DIAGNOSIS — R262 Difficulty in walking, not elsewhere classified: Secondary | ICD-10-CM | POA: Diagnosis not present

## 2013-06-11 DIAGNOSIS — I2510 Atherosclerotic heart disease of native coronary artery without angina pectoris: Secondary | ICD-10-CM | POA: Diagnosis not present

## 2013-06-11 DIAGNOSIS — E119 Type 2 diabetes mellitus without complications: Secondary | ICD-10-CM | POA: Diagnosis not present

## 2013-06-11 DIAGNOSIS — I10 Essential (primary) hypertension: Secondary | ICD-10-CM | POA: Diagnosis not present

## 2013-06-19 DIAGNOSIS — N390 Urinary tract infection, site not specified: Secondary | ICD-10-CM | POA: Diagnosis not present

## 2013-06-19 DIAGNOSIS — I2510 Atherosclerotic heart disease of native coronary artery without angina pectoris: Secondary | ICD-10-CM | POA: Diagnosis not present

## 2013-06-19 DIAGNOSIS — R262 Difficulty in walking, not elsewhere classified: Secondary | ICD-10-CM | POA: Diagnosis not present

## 2013-06-19 DIAGNOSIS — I10 Essential (primary) hypertension: Secondary | ICD-10-CM | POA: Diagnosis not present

## 2013-06-19 DIAGNOSIS — E119 Type 2 diabetes mellitus without complications: Secondary | ICD-10-CM | POA: Diagnosis not present

## 2013-06-23 DIAGNOSIS — R262 Difficulty in walking, not elsewhere classified: Secondary | ICD-10-CM | POA: Diagnosis not present

## 2013-06-23 DIAGNOSIS — I10 Essential (primary) hypertension: Secondary | ICD-10-CM | POA: Diagnosis not present

## 2013-06-23 DIAGNOSIS — N390 Urinary tract infection, site not specified: Secondary | ICD-10-CM | POA: Diagnosis not present

## 2013-06-23 DIAGNOSIS — E119 Type 2 diabetes mellitus without complications: Secondary | ICD-10-CM | POA: Diagnosis not present

## 2013-06-23 DIAGNOSIS — I2510 Atherosclerotic heart disease of native coronary artery without angina pectoris: Secondary | ICD-10-CM | POA: Diagnosis not present

## 2013-06-24 DIAGNOSIS — L851 Acquired keratosis [keratoderma] palmaris et plantaris: Secondary | ICD-10-CM | POA: Diagnosis not present

## 2013-06-24 DIAGNOSIS — B351 Tinea unguium: Secondary | ICD-10-CM | POA: Diagnosis not present

## 2013-06-24 DIAGNOSIS — E1149 Type 2 diabetes mellitus with other diabetic neurological complication: Secondary | ICD-10-CM | POA: Diagnosis not present

## 2013-06-27 DIAGNOSIS — E119 Type 2 diabetes mellitus without complications: Secondary | ICD-10-CM | POA: Diagnosis not present

## 2013-06-27 DIAGNOSIS — I2510 Atherosclerotic heart disease of native coronary artery without angina pectoris: Secondary | ICD-10-CM | POA: Diagnosis not present

## 2013-06-27 DIAGNOSIS — I10 Essential (primary) hypertension: Secondary | ICD-10-CM | POA: Diagnosis not present

## 2013-06-27 DIAGNOSIS — R262 Difficulty in walking, not elsewhere classified: Secondary | ICD-10-CM | POA: Diagnosis not present

## 2013-06-27 DIAGNOSIS — N390 Urinary tract infection, site not specified: Secondary | ICD-10-CM | POA: Diagnosis not present

## 2013-07-01 DIAGNOSIS — R262 Difficulty in walking, not elsewhere classified: Secondary | ICD-10-CM | POA: Diagnosis not present

## 2013-07-01 DIAGNOSIS — E119 Type 2 diabetes mellitus without complications: Secondary | ICD-10-CM | POA: Diagnosis not present

## 2013-07-01 DIAGNOSIS — I10 Essential (primary) hypertension: Secondary | ICD-10-CM | POA: Diagnosis not present

## 2013-07-01 DIAGNOSIS — I2510 Atherosclerotic heart disease of native coronary artery without angina pectoris: Secondary | ICD-10-CM | POA: Diagnosis not present

## 2013-07-01 DIAGNOSIS — N390 Urinary tract infection, site not specified: Secondary | ICD-10-CM | POA: Diagnosis not present

## 2013-07-03 DIAGNOSIS — N390 Urinary tract infection, site not specified: Secondary | ICD-10-CM | POA: Diagnosis not present

## 2013-07-03 DIAGNOSIS — I10 Essential (primary) hypertension: Secondary | ICD-10-CM | POA: Diagnosis not present

## 2013-07-03 DIAGNOSIS — I2510 Atherosclerotic heart disease of native coronary artery without angina pectoris: Secondary | ICD-10-CM | POA: Diagnosis not present

## 2013-07-03 DIAGNOSIS — R262 Difficulty in walking, not elsewhere classified: Secondary | ICD-10-CM | POA: Diagnosis not present

## 2013-07-03 DIAGNOSIS — E119 Type 2 diabetes mellitus without complications: Secondary | ICD-10-CM | POA: Diagnosis not present

## 2013-07-04 DIAGNOSIS — IMO0002 Reserved for concepts with insufficient information to code with codable children: Secondary | ICD-10-CM | POA: Diagnosis not present

## 2013-07-04 DIAGNOSIS — R6889 Other general symptoms and signs: Secondary | ICD-10-CM | POA: Diagnosis not present

## 2013-07-04 DIAGNOSIS — E119 Type 2 diabetes mellitus without complications: Secondary | ICD-10-CM | POA: Diagnosis not present

## 2013-07-04 DIAGNOSIS — Z8249 Family history of ischemic heart disease and other diseases of the circulatory system: Secondary | ICD-10-CM | POA: Diagnosis not present

## 2013-07-04 DIAGNOSIS — Z794 Long term (current) use of insulin: Secondary | ICD-10-CM | POA: Diagnosis not present

## 2013-07-04 DIAGNOSIS — Z79899 Other long term (current) drug therapy: Secondary | ICD-10-CM | POA: Diagnosis not present

## 2013-07-04 DIAGNOSIS — Z7982 Long term (current) use of aspirin: Secondary | ICD-10-CM | POA: Diagnosis not present

## 2013-07-04 DIAGNOSIS — S2249XA Multiple fractures of ribs, unspecified side, initial encounter for closed fracture: Secondary | ICD-10-CM | POA: Diagnosis not present

## 2013-07-04 DIAGNOSIS — W19XXXA Unspecified fall, initial encounter: Secondary | ICD-10-CM | POA: Diagnosis not present

## 2013-07-04 DIAGNOSIS — Z8673 Personal history of transient ischemic attack (TIA), and cerebral infarction without residual deficits: Secondary | ICD-10-CM | POA: Diagnosis not present

## 2013-07-04 DIAGNOSIS — I10 Essential (primary) hypertension: Secondary | ICD-10-CM | POA: Diagnosis not present

## 2013-07-04 DIAGNOSIS — Z833 Family history of diabetes mellitus: Secondary | ICD-10-CM | POA: Diagnosis not present

## 2013-07-04 DIAGNOSIS — R079 Chest pain, unspecified: Secondary | ICD-10-CM | POA: Diagnosis not present

## 2013-07-07 DIAGNOSIS — S20219A Contusion of unspecified front wall of thorax, initial encounter: Secondary | ICD-10-CM | POA: Diagnosis not present

## 2013-07-11 DIAGNOSIS — R079 Chest pain, unspecified: Secondary | ICD-10-CM | POA: Diagnosis not present

## 2013-07-14 DIAGNOSIS — N183 Chronic kidney disease, stage 3 unspecified: Secondary | ICD-10-CM | POA: Diagnosis not present

## 2013-07-14 DIAGNOSIS — Z940 Kidney transplant status: Secondary | ICD-10-CM | POA: Diagnosis not present

## 2013-07-15 DIAGNOSIS — L97509 Non-pressure chronic ulcer of other part of unspecified foot with unspecified severity: Secondary | ICD-10-CM | POA: Diagnosis not present

## 2013-08-04 DIAGNOSIS — E119 Type 2 diabetes mellitus without complications: Secondary | ICD-10-CM | POA: Diagnosis present

## 2013-08-04 DIAGNOSIS — R0602 Shortness of breath: Secondary | ICD-10-CM | POA: Diagnosis not present

## 2013-08-04 DIAGNOSIS — E785 Hyperlipidemia, unspecified: Secondary | ICD-10-CM | POA: Diagnosis present

## 2013-08-04 DIAGNOSIS — Z880 Allergy status to penicillin: Secondary | ICD-10-CM | POA: Diagnosis not present

## 2013-08-04 DIAGNOSIS — Z940 Kidney transplant status: Secondary | ICD-10-CM | POA: Diagnosis not present

## 2013-08-04 DIAGNOSIS — N183 Chronic kidney disease, stage 3 unspecified: Secondary | ICD-10-CM | POA: Diagnosis not present

## 2013-08-04 DIAGNOSIS — Z7982 Long term (current) use of aspirin: Secondary | ICD-10-CM | POA: Diagnosis not present

## 2013-08-04 DIAGNOSIS — L03119 Cellulitis of unspecified part of limb: Secondary | ICD-10-CM | POA: Diagnosis not present

## 2013-08-04 DIAGNOSIS — I129 Hypertensive chronic kidney disease with stage 1 through stage 4 chronic kidney disease, or unspecified chronic kidney disease: Secondary | ICD-10-CM | POA: Diagnosis present

## 2013-08-04 DIAGNOSIS — N289 Disorder of kidney and ureter, unspecified: Secondary | ICD-10-CM | POA: Diagnosis present

## 2013-08-04 DIAGNOSIS — Z86718 Personal history of other venous thrombosis and embolism: Secondary | ICD-10-CM | POA: Diagnosis not present

## 2013-08-04 DIAGNOSIS — Z9861 Coronary angioplasty status: Secondary | ICD-10-CM | POA: Diagnosis not present

## 2013-08-04 DIAGNOSIS — Z79899 Other long term (current) drug therapy: Secondary | ICD-10-CM | POA: Diagnosis not present

## 2013-08-04 DIAGNOSIS — L02419 Cutaneous abscess of limb, unspecified: Secondary | ICD-10-CM | POA: Diagnosis not present

## 2013-08-04 DIAGNOSIS — B9689 Other specified bacterial agents as the cause of diseases classified elsewhere: Secondary | ICD-10-CM | POA: Diagnosis not present

## 2013-08-04 DIAGNOSIS — B965 Pseudomonas (aeruginosa) (mallei) (pseudomallei) as the cause of diseases classified elsewhere: Secondary | ICD-10-CM | POA: Diagnosis not present

## 2013-08-04 DIAGNOSIS — I2510 Atherosclerotic heart disease of native coronary artery without angina pectoris: Secondary | ICD-10-CM | POA: Diagnosis present

## 2013-08-14 DIAGNOSIS — L02818 Cutaneous abscess of other sites: Secondary | ICD-10-CM | POA: Diagnosis not present

## 2013-08-14 DIAGNOSIS — L03818 Cellulitis of other sites: Secondary | ICD-10-CM | POA: Diagnosis not present

## 2013-08-27 DIAGNOSIS — R609 Edema, unspecified: Secondary | ICD-10-CM | POA: Diagnosis not present

## 2013-08-27 DIAGNOSIS — S81009A Unspecified open wound, unspecified knee, initial encounter: Secondary | ICD-10-CM | POA: Diagnosis not present

## 2013-08-27 DIAGNOSIS — B957 Other staphylococcus as the cause of diseases classified elsewhere: Secondary | ICD-10-CM | POA: Diagnosis present

## 2013-08-27 DIAGNOSIS — D696 Thrombocytopenia, unspecified: Secondary | ICD-10-CM | POA: Diagnosis present

## 2013-08-27 DIAGNOSIS — R5383 Other fatigue: Secondary | ICD-10-CM | POA: Diagnosis not present

## 2013-08-27 DIAGNOSIS — Z9861 Coronary angioplasty status: Secondary | ICD-10-CM | POA: Diagnosis not present

## 2013-08-27 DIAGNOSIS — Z6827 Body mass index (BMI) 27.0-27.9, adult: Secondary | ICD-10-CM | POA: Diagnosis not present

## 2013-08-27 DIAGNOSIS — R7881 Bacteremia: Secondary | ICD-10-CM | POA: Diagnosis not present

## 2013-08-27 DIAGNOSIS — E1165 Type 2 diabetes mellitus with hyperglycemia: Secondary | ICD-10-CM | POA: Diagnosis not present

## 2013-08-27 DIAGNOSIS — N179 Acute kidney failure, unspecified: Secondary | ICD-10-CM | POA: Diagnosis not present

## 2013-08-27 DIAGNOSIS — I2510 Atherosclerotic heart disease of native coronary artery without angina pectoris: Secondary | ICD-10-CM | POA: Diagnosis present

## 2013-08-27 DIAGNOSIS — M899 Disorder of bone, unspecified: Secondary | ICD-10-CM | POA: Diagnosis present

## 2013-08-27 DIAGNOSIS — Z940 Kidney transplant status: Secondary | ICD-10-CM | POA: Diagnosis not present

## 2013-08-27 DIAGNOSIS — R404 Transient alteration of awareness: Secondary | ICD-10-CM | POA: Diagnosis not present

## 2013-08-27 DIAGNOSIS — L03119 Cellulitis of unspecified part of limb: Secondary | ICD-10-CM | POA: Diagnosis not present

## 2013-08-27 DIAGNOSIS — I129 Hypertensive chronic kidney disease with stage 1 through stage 4 chronic kidney disease, or unspecified chronic kidney disease: Secondary | ICD-10-CM | POA: Diagnosis present

## 2013-08-27 DIAGNOSIS — E46 Unspecified protein-calorie malnutrition: Secondary | ICD-10-CM | POA: Diagnosis not present

## 2013-08-27 DIAGNOSIS — M1990 Unspecified osteoarthritis, unspecified site: Secondary | ICD-10-CM | POA: Diagnosis present

## 2013-08-27 DIAGNOSIS — S81809A Unspecified open wound, unspecified lower leg, initial encounter: Secondary | ICD-10-CM | POA: Diagnosis not present

## 2013-08-27 DIAGNOSIS — Z79899 Other long term (current) drug therapy: Secondary | ICD-10-CM | POA: Diagnosis not present

## 2013-08-27 DIAGNOSIS — J988 Other specified respiratory disorders: Secondary | ICD-10-CM | POA: Diagnosis not present

## 2013-08-27 DIAGNOSIS — Z86718 Personal history of other venous thrombosis and embolism: Secondary | ICD-10-CM | POA: Diagnosis not present

## 2013-08-27 DIAGNOSIS — R0602 Shortness of breath: Secondary | ICD-10-CM | POA: Diagnosis not present

## 2013-08-27 DIAGNOSIS — E872 Acidosis, unspecified: Secondary | ICD-10-CM | POA: Diagnosis not present

## 2013-08-27 DIAGNOSIS — N189 Chronic kidney disease, unspecified: Secondary | ICD-10-CM | POA: Diagnosis not present

## 2013-08-27 DIAGNOSIS — E1129 Type 2 diabetes mellitus with other diabetic kidney complication: Secondary | ICD-10-CM | POA: Diagnosis not present

## 2013-08-27 DIAGNOSIS — L02419 Cutaneous abscess of limb, unspecified: Secondary | ICD-10-CM | POA: Diagnosis not present

## 2013-08-27 DIAGNOSIS — Z880 Allergy status to penicillin: Secondary | ICD-10-CM | POA: Diagnosis not present

## 2013-08-27 DIAGNOSIS — J189 Pneumonia, unspecified organism: Secondary | ICD-10-CM | POA: Diagnosis not present

## 2013-08-27 DIAGNOSIS — Z7982 Long term (current) use of aspirin: Secondary | ICD-10-CM | POA: Diagnosis not present

## 2013-08-27 DIAGNOSIS — R5381 Other malaise: Secondary | ICD-10-CM | POA: Diagnosis not present

## 2013-08-27 DIAGNOSIS — N184 Chronic kidney disease, stage 4 (severe): Secondary | ICD-10-CM | POA: Diagnosis not present

## 2013-08-27 DIAGNOSIS — E785 Hyperlipidemia, unspecified: Secondary | ICD-10-CM | POA: Diagnosis present

## 2013-08-27 DIAGNOSIS — I739 Peripheral vascular disease, unspecified: Secondary | ICD-10-CM | POA: Diagnosis present

## 2013-08-27 DIAGNOSIS — IMO0001 Reserved for inherently not codable concepts without codable children: Secondary | ICD-10-CM | POA: Diagnosis not present

## 2013-08-27 DIAGNOSIS — Z794 Long term (current) use of insulin: Secondary | ICD-10-CM | POA: Diagnosis not present

## 2013-08-27 DIAGNOSIS — Z8673 Personal history of transient ischemic attack (TIA), and cerebral infarction without residual deficits: Secondary | ICD-10-CM | POA: Diagnosis not present

## 2013-08-27 DIAGNOSIS — D649 Anemia, unspecified: Secondary | ICD-10-CM | POA: Diagnosis not present

## 2013-08-27 DIAGNOSIS — S2249XA Multiple fractures of ribs, unspecified side, initial encounter for closed fracture: Secondary | ICD-10-CM | POA: Diagnosis not present

## 2013-09-11 DIAGNOSIS — Z940 Kidney transplant status: Secondary | ICD-10-CM | POA: Diagnosis not present

## 2013-09-19 DIAGNOSIS — I10 Essential (primary) hypertension: Secondary | ICD-10-CM | POA: Diagnosis not present

## 2013-09-19 DIAGNOSIS — L02419 Cutaneous abscess of limb, unspecified: Secondary | ICD-10-CM | POA: Diagnosis not present

## 2013-09-23 DIAGNOSIS — B351 Tinea unguium: Secondary | ICD-10-CM | POA: Diagnosis not present

## 2013-09-23 DIAGNOSIS — L851 Acquired keratosis [keratoderma] palmaris et plantaris: Secondary | ICD-10-CM | POA: Diagnosis not present

## 2013-09-23 DIAGNOSIS — E1149 Type 2 diabetes mellitus with other diabetic neurological complication: Secondary | ICD-10-CM | POA: Diagnosis not present

## 2013-09-28 DIAGNOSIS — E860 Dehydration: Secondary | ICD-10-CM | POA: Diagnosis present

## 2013-09-28 DIAGNOSIS — M47817 Spondylosis without myelopathy or radiculopathy, lumbosacral region: Secondary | ICD-10-CM | POA: Diagnosis present

## 2013-09-28 DIAGNOSIS — N189 Chronic kidney disease, unspecified: Secondary | ICD-10-CM | POA: Diagnosis present

## 2013-09-28 DIAGNOSIS — Z7982 Long term (current) use of aspirin: Secondary | ICD-10-CM | POA: Diagnosis not present

## 2013-09-28 DIAGNOSIS — E872 Acidosis, unspecified: Secondary | ICD-10-CM | POA: Diagnosis not present

## 2013-09-28 DIAGNOSIS — R5383 Other fatigue: Secondary | ICD-10-CM | POA: Diagnosis not present

## 2013-09-28 DIAGNOSIS — Z79899 Other long term (current) drug therapy: Secondary | ICD-10-CM | POA: Diagnosis not present

## 2013-09-28 DIAGNOSIS — E876 Hypokalemia: Secondary | ICD-10-CM | POA: Diagnosis not present

## 2013-09-28 DIAGNOSIS — E119 Type 2 diabetes mellitus without complications: Secondary | ICD-10-CM | POA: Diagnosis present

## 2013-09-28 DIAGNOSIS — Z880 Allergy status to penicillin: Secondary | ICD-10-CM | POA: Diagnosis not present

## 2013-09-28 DIAGNOSIS — IMO0002 Reserved for concepts with insufficient information to code with codable children: Secondary | ICD-10-CM | POA: Diagnosis not present

## 2013-09-28 DIAGNOSIS — R5381 Other malaise: Secondary | ICD-10-CM | POA: Diagnosis not present

## 2013-09-28 DIAGNOSIS — I129 Hypertensive chronic kidney disease with stage 1 through stage 4 chronic kidney disease, or unspecified chronic kidney disease: Secondary | ICD-10-CM | POA: Diagnosis present

## 2013-09-28 DIAGNOSIS — Z940 Kidney transplant status: Secondary | ICD-10-CM | POA: Diagnosis not present

## 2013-09-28 DIAGNOSIS — Z794 Long term (current) use of insulin: Secondary | ICD-10-CM | POA: Diagnosis not present

## 2013-09-28 DIAGNOSIS — I739 Peripheral vascular disease, unspecified: Secondary | ICD-10-CM | POA: Diagnosis present

## 2013-09-28 DIAGNOSIS — S2249XA Multiple fractures of ribs, unspecified side, initial encounter for closed fracture: Secondary | ICD-10-CM | POA: Diagnosis not present

## 2013-09-28 DIAGNOSIS — T451X5A Adverse effect of antineoplastic and immunosuppressive drugs, initial encounter: Secondary | ICD-10-CM | POA: Diagnosis not present

## 2013-09-28 DIAGNOSIS — M1710 Unilateral primary osteoarthritis, unspecified knee: Secondary | ICD-10-CM | POA: Diagnosis present

## 2013-09-28 DIAGNOSIS — Z9861 Coronary angioplasty status: Secondary | ICD-10-CM | POA: Diagnosis not present

## 2013-09-28 DIAGNOSIS — R404 Transient alteration of awareness: Secondary | ICD-10-CM | POA: Diagnosis not present

## 2013-09-28 DIAGNOSIS — I2510 Atherosclerotic heart disease of native coronary artery without angina pectoris: Secondary | ICD-10-CM | POA: Diagnosis not present

## 2013-09-28 DIAGNOSIS — Z8673 Personal history of transient ischemic attack (TIA), and cerebral infarction without residual deficits: Secondary | ICD-10-CM | POA: Diagnosis not present

## 2013-09-28 DIAGNOSIS — IMO0001 Reserved for inherently not codable concepts without codable children: Secondary | ICD-10-CM | POA: Diagnosis not present

## 2013-09-28 DIAGNOSIS — T50995A Adverse effect of other drugs, medicaments and biological substances, initial encounter: Secondary | ICD-10-CM | POA: Diagnosis not present

## 2013-09-28 DIAGNOSIS — N179 Acute kidney failure, unspecified: Secondary | ICD-10-CM | POA: Diagnosis not present

## 2013-10-03 DIAGNOSIS — I129 Hypertensive chronic kidney disease with stage 1 through stage 4 chronic kidney disease, or unspecified chronic kidney disease: Secondary | ICD-10-CM | POA: Diagnosis not present

## 2013-10-03 DIAGNOSIS — S2249XD Multiple fractures of ribs, unspecified side, subsequent encounter for fracture with routine healing: Secondary | ICD-10-CM | POA: Diagnosis not present

## 2013-10-03 DIAGNOSIS — Z87440 Personal history of urinary (tract) infections: Secondary | ICD-10-CM | POA: Diagnosis not present

## 2013-10-03 DIAGNOSIS — E119 Type 2 diabetes mellitus without complications: Secondary | ICD-10-CM | POA: Diagnosis not present

## 2013-10-03 DIAGNOSIS — IMO0001 Reserved for inherently not codable concepts without codable children: Secondary | ICD-10-CM | POA: Diagnosis not present

## 2013-10-03 DIAGNOSIS — N184 Chronic kidney disease, stage 4 (severe): Secondary | ICD-10-CM | POA: Diagnosis not present

## 2013-10-03 DIAGNOSIS — Z5181 Encounter for therapeutic drug level monitoring: Secondary | ICD-10-CM | POA: Diagnosis not present

## 2013-10-03 DIAGNOSIS — Z9181 History of falling: Secondary | ICD-10-CM | POA: Diagnosis not present

## 2013-10-03 DIAGNOSIS — Z794 Long term (current) use of insulin: Secondary | ICD-10-CM | POA: Diagnosis not present

## 2013-10-03 DIAGNOSIS — L989 Disorder of the skin and subcutaneous tissue, unspecified: Secondary | ICD-10-CM | POA: Diagnosis not present

## 2013-10-03 DIAGNOSIS — I10 Essential (primary) hypertension: Secondary | ICD-10-CM | POA: Diagnosis not present

## 2013-10-05 DIAGNOSIS — N184 Chronic kidney disease, stage 4 (severe): Secondary | ICD-10-CM | POA: Diagnosis not present

## 2013-10-05 DIAGNOSIS — E119 Type 2 diabetes mellitus without complications: Secondary | ICD-10-CM | POA: Diagnosis not present

## 2013-10-05 DIAGNOSIS — I129 Hypertensive chronic kidney disease with stage 1 through stage 4 chronic kidney disease, or unspecified chronic kidney disease: Secondary | ICD-10-CM | POA: Diagnosis not present

## 2013-10-05 DIAGNOSIS — S2249XD Multiple fractures of ribs, unspecified side, subsequent encounter for fracture with routine healing: Secondary | ICD-10-CM | POA: Diagnosis not present

## 2013-10-05 DIAGNOSIS — L989 Disorder of the skin and subcutaneous tissue, unspecified: Secondary | ICD-10-CM | POA: Diagnosis not present

## 2013-10-05 DIAGNOSIS — Z87440 Personal history of urinary (tract) infections: Secondary | ICD-10-CM | POA: Diagnosis not present

## 2013-10-08 DIAGNOSIS — E119 Type 2 diabetes mellitus without complications: Secondary | ICD-10-CM | POA: Diagnosis not present

## 2013-10-08 DIAGNOSIS — N184 Chronic kidney disease, stage 4 (severe): Secondary | ICD-10-CM | POA: Diagnosis not present

## 2013-10-08 DIAGNOSIS — Z87440 Personal history of urinary (tract) infections: Secondary | ICD-10-CM | POA: Diagnosis not present

## 2013-10-08 DIAGNOSIS — I129 Hypertensive chronic kidney disease with stage 1 through stage 4 chronic kidney disease, or unspecified chronic kidney disease: Secondary | ICD-10-CM | POA: Diagnosis not present

## 2013-10-08 DIAGNOSIS — L989 Disorder of the skin and subcutaneous tissue, unspecified: Secondary | ICD-10-CM | POA: Diagnosis not present

## 2013-10-08 DIAGNOSIS — S2249XD Multiple fractures of ribs, unspecified side, subsequent encounter for fracture with routine healing: Secondary | ICD-10-CM | POA: Diagnosis not present

## 2013-10-09 DIAGNOSIS — N184 Chronic kidney disease, stage 4 (severe): Secondary | ICD-10-CM | POA: Diagnosis not present

## 2013-10-09 DIAGNOSIS — S2249XD Multiple fractures of ribs, unspecified side, subsequent encounter for fracture with routine healing: Secondary | ICD-10-CM | POA: Diagnosis not present

## 2013-10-09 DIAGNOSIS — Z940 Kidney transplant status: Secondary | ICD-10-CM | POA: Diagnosis not present

## 2013-10-09 DIAGNOSIS — E872 Acidosis, unspecified: Secondary | ICD-10-CM | POA: Diagnosis not present

## 2013-10-09 DIAGNOSIS — L989 Disorder of the skin and subcutaneous tissue, unspecified: Secondary | ICD-10-CM | POA: Diagnosis not present

## 2013-10-09 DIAGNOSIS — I129 Hypertensive chronic kidney disease with stage 1 through stage 4 chronic kidney disease, or unspecified chronic kidney disease: Secondary | ICD-10-CM | POA: Diagnosis not present

## 2013-10-09 DIAGNOSIS — Z5181 Encounter for therapeutic drug level monitoring: Secondary | ICD-10-CM | POA: Diagnosis not present

## 2013-10-09 DIAGNOSIS — N179 Acute kidney failure, unspecified: Secondary | ICD-10-CM | POA: Diagnosis not present

## 2013-10-09 DIAGNOSIS — Z87440 Personal history of urinary (tract) infections: Secondary | ICD-10-CM | POA: Diagnosis not present

## 2013-10-09 DIAGNOSIS — E119 Type 2 diabetes mellitus without complications: Secondary | ICD-10-CM | POA: Diagnosis not present

## 2013-10-10 DIAGNOSIS — N19 Unspecified kidney failure: Secondary | ICD-10-CM | POA: Diagnosis not present

## 2013-10-13 DIAGNOSIS — Z87440 Personal history of urinary (tract) infections: Secondary | ICD-10-CM | POA: Diagnosis not present

## 2013-10-13 DIAGNOSIS — L989 Disorder of the skin and subcutaneous tissue, unspecified: Secondary | ICD-10-CM | POA: Diagnosis not present

## 2013-10-13 DIAGNOSIS — N184 Chronic kidney disease, stage 4 (severe): Secondary | ICD-10-CM | POA: Diagnosis not present

## 2013-10-13 DIAGNOSIS — I129 Hypertensive chronic kidney disease with stage 1 through stage 4 chronic kidney disease, or unspecified chronic kidney disease: Secondary | ICD-10-CM | POA: Diagnosis not present

## 2013-10-13 DIAGNOSIS — S2249XD Multiple fractures of ribs, unspecified side, subsequent encounter for fracture with routine healing: Secondary | ICD-10-CM | POA: Diagnosis not present

## 2013-10-13 DIAGNOSIS — E119 Type 2 diabetes mellitus without complications: Secondary | ICD-10-CM | POA: Diagnosis not present

## 2013-10-14 DIAGNOSIS — I129 Hypertensive chronic kidney disease with stage 1 through stage 4 chronic kidney disease, or unspecified chronic kidney disease: Secondary | ICD-10-CM | POA: Diagnosis not present

## 2013-10-14 DIAGNOSIS — L989 Disorder of the skin and subcutaneous tissue, unspecified: Secondary | ICD-10-CM | POA: Diagnosis not present

## 2013-10-14 DIAGNOSIS — E119 Type 2 diabetes mellitus without complications: Secondary | ICD-10-CM | POA: Diagnosis not present

## 2013-10-14 DIAGNOSIS — Z87440 Personal history of urinary (tract) infections: Secondary | ICD-10-CM | POA: Diagnosis not present

## 2013-10-14 DIAGNOSIS — S2249XD Multiple fractures of ribs, unspecified side, subsequent encounter for fracture with routine healing: Secondary | ICD-10-CM | POA: Diagnosis not present

## 2013-10-14 DIAGNOSIS — N184 Chronic kidney disease, stage 4 (severe): Secondary | ICD-10-CM | POA: Diagnosis not present

## 2013-10-15 DIAGNOSIS — L989 Disorder of the skin and subcutaneous tissue, unspecified: Secondary | ICD-10-CM | POA: Diagnosis not present

## 2013-10-15 DIAGNOSIS — E119 Type 2 diabetes mellitus without complications: Secondary | ICD-10-CM | POA: Diagnosis not present

## 2013-10-15 DIAGNOSIS — Z87440 Personal history of urinary (tract) infections: Secondary | ICD-10-CM | POA: Diagnosis not present

## 2013-10-15 DIAGNOSIS — N184 Chronic kidney disease, stage 4 (severe): Secondary | ICD-10-CM | POA: Diagnosis not present

## 2013-10-15 DIAGNOSIS — S2249XD Multiple fractures of ribs, unspecified side, subsequent encounter for fracture with routine healing: Secondary | ICD-10-CM | POA: Diagnosis not present

## 2013-10-15 DIAGNOSIS — I129 Hypertensive chronic kidney disease with stage 1 through stage 4 chronic kidney disease, or unspecified chronic kidney disease: Secondary | ICD-10-CM | POA: Diagnosis not present

## 2013-10-16 DIAGNOSIS — I129 Hypertensive chronic kidney disease with stage 1 through stage 4 chronic kidney disease, or unspecified chronic kidney disease: Secondary | ICD-10-CM | POA: Diagnosis not present

## 2013-10-16 DIAGNOSIS — Z87440 Personal history of urinary (tract) infections: Secondary | ICD-10-CM | POA: Diagnosis not present

## 2013-10-16 DIAGNOSIS — S2249XD Multiple fractures of ribs, unspecified side, subsequent encounter for fracture with routine healing: Secondary | ICD-10-CM | POA: Diagnosis not present

## 2013-10-16 DIAGNOSIS — L989 Disorder of the skin and subcutaneous tissue, unspecified: Secondary | ICD-10-CM | POA: Diagnosis not present

## 2013-10-16 DIAGNOSIS — N184 Chronic kidney disease, stage 4 (severe): Secondary | ICD-10-CM | POA: Diagnosis not present

## 2013-10-16 DIAGNOSIS — E119 Type 2 diabetes mellitus without complications: Secondary | ICD-10-CM | POA: Diagnosis not present

## 2013-10-17 DIAGNOSIS — N184 Chronic kidney disease, stage 4 (severe): Secondary | ICD-10-CM | POA: Diagnosis not present

## 2013-10-17 DIAGNOSIS — S2249XD Multiple fractures of ribs, unspecified side, subsequent encounter for fracture with routine healing: Secondary | ICD-10-CM | POA: Diagnosis not present

## 2013-10-17 DIAGNOSIS — I129 Hypertensive chronic kidney disease with stage 1 through stage 4 chronic kidney disease, or unspecified chronic kidney disease: Secondary | ICD-10-CM | POA: Diagnosis not present

## 2013-10-17 DIAGNOSIS — L989 Disorder of the skin and subcutaneous tissue, unspecified: Secondary | ICD-10-CM | POA: Diagnosis not present

## 2013-10-17 DIAGNOSIS — E119 Type 2 diabetes mellitus without complications: Secondary | ICD-10-CM | POA: Diagnosis not present

## 2013-10-17 DIAGNOSIS — Z87440 Personal history of urinary (tract) infections: Secondary | ICD-10-CM | POA: Diagnosis not present

## 2013-10-20 DIAGNOSIS — N184 Chronic kidney disease, stage 4 (severe): Secondary | ICD-10-CM | POA: Diagnosis not present

## 2013-10-20 DIAGNOSIS — S2249XD Multiple fractures of ribs, unspecified side, subsequent encounter for fracture with routine healing: Secondary | ICD-10-CM | POA: Diagnosis not present

## 2013-10-20 DIAGNOSIS — L989 Disorder of the skin and subcutaneous tissue, unspecified: Secondary | ICD-10-CM | POA: Diagnosis not present

## 2013-10-20 DIAGNOSIS — I129 Hypertensive chronic kidney disease with stage 1 through stage 4 chronic kidney disease, or unspecified chronic kidney disease: Secondary | ICD-10-CM | POA: Diagnosis not present

## 2013-10-20 DIAGNOSIS — E119 Type 2 diabetes mellitus without complications: Secondary | ICD-10-CM | POA: Diagnosis not present

## 2013-10-20 DIAGNOSIS — Z87440 Personal history of urinary (tract) infections: Secondary | ICD-10-CM | POA: Diagnosis not present

## 2013-10-21 DIAGNOSIS — N184 Chronic kidney disease, stage 4 (severe): Secondary | ICD-10-CM | POA: Diagnosis not present

## 2013-10-21 DIAGNOSIS — E119 Type 2 diabetes mellitus without complications: Secondary | ICD-10-CM | POA: Diagnosis not present

## 2013-10-21 DIAGNOSIS — S2249XD Multiple fractures of ribs, unspecified side, subsequent encounter for fracture with routine healing: Secondary | ICD-10-CM | POA: Diagnosis not present

## 2013-10-21 DIAGNOSIS — L989 Disorder of the skin and subcutaneous tissue, unspecified: Secondary | ICD-10-CM | POA: Diagnosis not present

## 2013-10-21 DIAGNOSIS — Z87440 Personal history of urinary (tract) infections: Secondary | ICD-10-CM | POA: Diagnosis not present

## 2013-10-21 DIAGNOSIS — I129 Hypertensive chronic kidney disease with stage 1 through stage 4 chronic kidney disease, or unspecified chronic kidney disease: Secondary | ICD-10-CM | POA: Diagnosis not present

## 2013-10-22 DIAGNOSIS — N184 Chronic kidney disease, stage 4 (severe): Secondary | ICD-10-CM | POA: Diagnosis not present

## 2013-10-22 DIAGNOSIS — L989 Disorder of the skin and subcutaneous tissue, unspecified: Secondary | ICD-10-CM | POA: Diagnosis not present

## 2013-10-22 DIAGNOSIS — Z87440 Personal history of urinary (tract) infections: Secondary | ICD-10-CM | POA: Diagnosis not present

## 2013-10-22 DIAGNOSIS — I129 Hypertensive chronic kidney disease with stage 1 through stage 4 chronic kidney disease, or unspecified chronic kidney disease: Secondary | ICD-10-CM | POA: Diagnosis not present

## 2013-10-22 DIAGNOSIS — E119 Type 2 diabetes mellitus without complications: Secondary | ICD-10-CM | POA: Diagnosis not present

## 2013-10-22 DIAGNOSIS — S2249XD Multiple fractures of ribs, unspecified side, subsequent encounter for fracture with routine healing: Secondary | ICD-10-CM | POA: Diagnosis not present

## 2013-10-23 DIAGNOSIS — N19 Unspecified kidney failure: Secondary | ICD-10-CM | POA: Diagnosis not present

## 2013-10-23 DIAGNOSIS — I10 Essential (primary) hypertension: Secondary | ICD-10-CM | POA: Diagnosis not present

## 2013-10-23 DIAGNOSIS — Z131 Encounter for screening for diabetes mellitus: Secondary | ICD-10-CM | POA: Diagnosis not present

## 2013-10-29 DIAGNOSIS — Z87440 Personal history of urinary (tract) infections: Secondary | ICD-10-CM | POA: Diagnosis not present

## 2013-10-29 DIAGNOSIS — E119 Type 2 diabetes mellitus without complications: Secondary | ICD-10-CM | POA: Diagnosis not present

## 2013-10-29 DIAGNOSIS — N184 Chronic kidney disease, stage 4 (severe): Secondary | ICD-10-CM | POA: Diagnosis not present

## 2013-10-29 DIAGNOSIS — L989 Disorder of the skin and subcutaneous tissue, unspecified: Secondary | ICD-10-CM | POA: Diagnosis not present

## 2013-10-29 DIAGNOSIS — S2249XD Multiple fractures of ribs, unspecified side, subsequent encounter for fracture with routine healing: Secondary | ICD-10-CM | POA: Diagnosis not present

## 2013-10-29 DIAGNOSIS — I129 Hypertensive chronic kidney disease with stage 1 through stage 4 chronic kidney disease, or unspecified chronic kidney disease: Secondary | ICD-10-CM | POA: Diagnosis not present

## 2013-10-30 DIAGNOSIS — Z87440 Personal history of urinary (tract) infections: Secondary | ICD-10-CM | POA: Diagnosis not present

## 2013-10-30 DIAGNOSIS — N184 Chronic kidney disease, stage 4 (severe): Secondary | ICD-10-CM | POA: Diagnosis not present

## 2013-10-30 DIAGNOSIS — L989 Disorder of the skin and subcutaneous tissue, unspecified: Secondary | ICD-10-CM | POA: Diagnosis not present

## 2013-10-30 DIAGNOSIS — I129 Hypertensive chronic kidney disease with stage 1 through stage 4 chronic kidney disease, or unspecified chronic kidney disease: Secondary | ICD-10-CM | POA: Diagnosis not present

## 2013-10-30 DIAGNOSIS — S2249XD Multiple fractures of ribs, unspecified side, subsequent encounter for fracture with routine healing: Secondary | ICD-10-CM | POA: Diagnosis not present

## 2013-10-30 DIAGNOSIS — E119 Type 2 diabetes mellitus without complications: Secondary | ICD-10-CM | POA: Diagnosis not present

## 2013-11-03 DIAGNOSIS — E119 Type 2 diabetes mellitus without complications: Secondary | ICD-10-CM | POA: Diagnosis not present

## 2013-11-03 DIAGNOSIS — Z794 Long term (current) use of insulin: Secondary | ICD-10-CM | POA: Diagnosis not present

## 2013-11-03 DIAGNOSIS — E785 Hyperlipidemia, unspecified: Secondary | ICD-10-CM | POA: Diagnosis not present

## 2013-11-03 DIAGNOSIS — N19 Unspecified kidney failure: Secondary | ICD-10-CM | POA: Diagnosis not present

## 2013-11-03 DIAGNOSIS — Z8639 Personal history of other endocrine, nutritional and metabolic disease: Secondary | ICD-10-CM | POA: Diagnosis not present

## 2013-11-03 DIAGNOSIS — Z5181 Encounter for therapeutic drug level monitoring: Secondary | ICD-10-CM | POA: Diagnosis not present

## 2013-11-03 DIAGNOSIS — Z8673 Personal history of transient ischemic attack (TIA), and cerebral infarction without residual deficits: Secondary | ICD-10-CM | POA: Diagnosis not present

## 2013-11-03 DIAGNOSIS — N179 Acute kidney failure, unspecified: Secondary | ICD-10-CM | POA: Diagnosis not present

## 2013-11-03 DIAGNOSIS — Z940 Kidney transplant status: Secondary | ICD-10-CM | POA: Diagnosis not present

## 2013-11-03 DIAGNOSIS — T8612 Kidney transplant failure: Secondary | ICD-10-CM | POA: Diagnosis not present

## 2013-11-03 DIAGNOSIS — I2510 Atherosclerotic heart disease of native coronary artery without angina pectoris: Secondary | ICD-10-CM | POA: Diagnosis present

## 2013-11-03 DIAGNOSIS — E1121 Type 2 diabetes mellitus with diabetic nephropathy: Secondary | ICD-10-CM | POA: Diagnosis present

## 2013-11-03 DIAGNOSIS — I129 Hypertensive chronic kidney disease with stage 1 through stage 4 chronic kidney disease, or unspecified chronic kidney disease: Secondary | ICD-10-CM | POA: Diagnosis not present

## 2013-11-03 DIAGNOSIS — N390 Urinary tract infection, site not specified: Secondary | ICD-10-CM | POA: Diagnosis not present

## 2013-11-03 DIAGNOSIS — E1122 Type 2 diabetes mellitus with diabetic chronic kidney disease: Secondary | ICD-10-CM | POA: Diagnosis present

## 2013-11-03 DIAGNOSIS — Z66 Do not resuscitate: Secondary | ICD-10-CM | POA: Diagnosis present

## 2013-11-03 DIAGNOSIS — N186 End stage renal disease: Secondary | ICD-10-CM | POA: Diagnosis not present

## 2013-11-03 DIAGNOSIS — Z79899 Other long term (current) drug therapy: Secondary | ICD-10-CM | POA: Diagnosis not present

## 2013-11-03 DIAGNOSIS — N184 Chronic kidney disease, stage 4 (severe): Secondary | ICD-10-CM | POA: Diagnosis not present

## 2013-11-03 DIAGNOSIS — Z4822 Encounter for aftercare following kidney transplant: Secondary | ICD-10-CM | POA: Diagnosis not present

## 2013-11-03 DIAGNOSIS — A082 Adenoviral enteritis: Secondary | ICD-10-CM | POA: Diagnosis present

## 2013-11-03 DIAGNOSIS — N281 Cyst of kidney, acquired: Secondary | ICD-10-CM | POA: Diagnosis not present

## 2013-11-03 DIAGNOSIS — I120 Hypertensive chronic kidney disease with stage 5 chronic kidney disease or end stage renal disease: Secondary | ICD-10-CM | POA: Diagnosis present

## 2013-11-03 DIAGNOSIS — B9689 Other specified bacterial agents as the cause of diseases classified elsewhere: Secondary | ICD-10-CM | POA: Diagnosis not present

## 2013-11-03 DIAGNOSIS — N189 Chronic kidney disease, unspecified: Secondary | ICD-10-CM | POA: Diagnosis not present

## 2013-11-03 DIAGNOSIS — E872 Acidosis: Secondary | ICD-10-CM | POA: Diagnosis not present

## 2013-11-10 DIAGNOSIS — Z940 Kidney transplant status: Secondary | ICD-10-CM | POA: Diagnosis not present

## 2013-11-18 DIAGNOSIS — S2249XD Multiple fractures of ribs, unspecified side, subsequent encounter for fracture with routine healing: Secondary | ICD-10-CM | POA: Diagnosis not present

## 2013-11-18 DIAGNOSIS — Z87440 Personal history of urinary (tract) infections: Secondary | ICD-10-CM | POA: Diagnosis not present

## 2013-11-18 DIAGNOSIS — E119 Type 2 diabetes mellitus without complications: Secondary | ICD-10-CM | POA: Diagnosis not present

## 2013-11-18 DIAGNOSIS — L989 Disorder of the skin and subcutaneous tissue, unspecified: Secondary | ICD-10-CM | POA: Diagnosis not present

## 2013-11-18 DIAGNOSIS — N184 Chronic kidney disease, stage 4 (severe): Secondary | ICD-10-CM | POA: Diagnosis not present

## 2013-11-18 DIAGNOSIS — I129 Hypertensive chronic kidney disease with stage 1 through stage 4 chronic kidney disease, or unspecified chronic kidney disease: Secondary | ICD-10-CM | POA: Diagnosis not present

## 2013-11-21 DIAGNOSIS — Z940 Kidney transplant status: Secondary | ICD-10-CM | POA: Diagnosis not present

## 2013-11-24 DIAGNOSIS — I120 Hypertensive chronic kidney disease with stage 5 chronic kidney disease or end stage renal disease: Secondary | ICD-10-CM | POA: Diagnosis not present

## 2013-12-03 DIAGNOSIS — K432 Incisional hernia without obstruction or gangrene: Secondary | ICD-10-CM | POA: Diagnosis not present

## 2013-12-03 DIAGNOSIS — N184 Chronic kidney disease, stage 4 (severe): Secondary | ICD-10-CM | POA: Diagnosis not present

## 2013-12-03 DIAGNOSIS — Z79899 Other long term (current) drug therapy: Secondary | ICD-10-CM | POA: Diagnosis not present

## 2013-12-03 DIAGNOSIS — I701 Atherosclerosis of renal artery: Secondary | ICD-10-CM | POA: Diagnosis not present

## 2013-12-03 DIAGNOSIS — N390 Urinary tract infection, site not specified: Secondary | ICD-10-CM | POA: Diagnosis not present

## 2013-12-03 DIAGNOSIS — T8611 Kidney transplant rejection: Secondary | ICD-10-CM | POA: Diagnosis not present

## 2013-12-03 DIAGNOSIS — N179 Acute kidney failure, unspecified: Secondary | ICD-10-CM | POA: Diagnosis not present

## 2013-12-03 DIAGNOSIS — N189 Chronic kidney disease, unspecified: Secondary | ICD-10-CM | POA: Diagnosis not present

## 2013-12-03 DIAGNOSIS — E872 Acidosis: Secondary | ICD-10-CM | POA: Diagnosis not present

## 2013-12-03 DIAGNOSIS — I129 Hypertensive chronic kidney disease with stage 1 through stage 4 chronic kidney disease, or unspecified chronic kidney disease: Secondary | ICD-10-CM | POA: Diagnosis not present

## 2013-12-03 DIAGNOSIS — E119 Type 2 diabetes mellitus without complications: Secondary | ICD-10-CM | POA: Diagnosis not present

## 2013-12-03 DIAGNOSIS — Z940 Kidney transplant status: Secondary | ICD-10-CM | POA: Diagnosis not present

## 2013-12-09 DIAGNOSIS — E1142 Type 2 diabetes mellitus with diabetic polyneuropathy: Secondary | ICD-10-CM | POA: Diagnosis not present

## 2013-12-09 DIAGNOSIS — B351 Tinea unguium: Secondary | ICD-10-CM | POA: Diagnosis not present

## 2013-12-09 DIAGNOSIS — L84 Corns and callosities: Secondary | ICD-10-CM | POA: Diagnosis not present

## 2013-12-29 DIAGNOSIS — Z940 Kidney transplant status: Secondary | ICD-10-CM | POA: Diagnosis not present

## 2014-01-29 DIAGNOSIS — Z940 Kidney transplant status: Secondary | ICD-10-CM | POA: Diagnosis not present

## 2014-02-17 DIAGNOSIS — E119 Type 2 diabetes mellitus without complications: Secondary | ICD-10-CM | POA: Diagnosis not present

## 2014-02-17 DIAGNOSIS — Z4822 Encounter for aftercare following kidney transplant: Secondary | ICD-10-CM | POA: Diagnosis not present

## 2014-02-17 DIAGNOSIS — Z794 Long term (current) use of insulin: Secondary | ICD-10-CM | POA: Diagnosis not present

## 2014-02-17 DIAGNOSIS — I129 Hypertensive chronic kidney disease with stage 1 through stage 4 chronic kidney disease, or unspecified chronic kidney disease: Secondary | ICD-10-CM | POA: Diagnosis not present

## 2014-02-17 DIAGNOSIS — N184 Chronic kidney disease, stage 4 (severe): Secondary | ICD-10-CM | POA: Diagnosis not present

## 2014-02-17 DIAGNOSIS — Z79899 Other long term (current) drug therapy: Secondary | ICD-10-CM | POA: Diagnosis not present

## 2014-02-17 DIAGNOSIS — Z8673 Personal history of transient ischemic attack (TIA), and cerebral infarction without residual deficits: Secondary | ICD-10-CM | POA: Diagnosis not present

## 2014-02-17 DIAGNOSIS — T8611 Kidney transplant rejection: Secondary | ICD-10-CM | POA: Diagnosis not present

## 2014-02-17 DIAGNOSIS — Z940 Kidney transplant status: Secondary | ICD-10-CM | POA: Diagnosis not present

## 2014-02-17 DIAGNOSIS — R748 Abnormal levels of other serum enzymes: Secondary | ICD-10-CM | POA: Diagnosis not present

## 2014-02-17 DIAGNOSIS — K432 Incisional hernia without obstruction or gangrene: Secondary | ICD-10-CM | POA: Diagnosis not present

## 2014-02-17 DIAGNOSIS — N183 Chronic kidney disease, stage 3 (moderate): Secondary | ICD-10-CM | POA: Diagnosis not present

## 2014-02-19 DIAGNOSIS — E1142 Type 2 diabetes mellitus with diabetic polyneuropathy: Secondary | ICD-10-CM | POA: Diagnosis not present

## 2014-02-19 DIAGNOSIS — L84 Corns and callosities: Secondary | ICD-10-CM | POA: Diagnosis not present

## 2014-02-19 DIAGNOSIS — B351 Tinea unguium: Secondary | ICD-10-CM | POA: Diagnosis not present

## 2014-03-12 DIAGNOSIS — Z794 Long term (current) use of insulin: Secondary | ICD-10-CM | POA: Diagnosis not present

## 2014-03-12 DIAGNOSIS — E785 Hyperlipidemia, unspecified: Secondary | ICD-10-CM | POA: Diagnosis not present

## 2014-03-12 DIAGNOSIS — N281 Cyst of kidney, acquired: Secondary | ICD-10-CM | POA: Diagnosis not present

## 2014-03-12 DIAGNOSIS — Z8639 Personal history of other endocrine, nutritional and metabolic disease: Secondary | ICD-10-CM | POA: Diagnosis not present

## 2014-03-12 DIAGNOSIS — E1165 Type 2 diabetes mellitus with hyperglycemia: Secondary | ICD-10-CM | POA: Diagnosis not present

## 2014-03-12 DIAGNOSIS — E119 Type 2 diabetes mellitus without complications: Secondary | ICD-10-CM | POA: Diagnosis not present

## 2014-03-12 DIAGNOSIS — M47896 Other spondylosis, lumbar region: Secondary | ICD-10-CM | POA: Diagnosis not present

## 2014-03-12 DIAGNOSIS — Z940 Kidney transplant status: Secondary | ICD-10-CM | POA: Diagnosis not present

## 2014-03-12 DIAGNOSIS — E875 Hyperkalemia: Secondary | ICD-10-CM | POA: Diagnosis not present

## 2014-03-12 DIAGNOSIS — Z6828 Body mass index (BMI) 28.0-28.9, adult: Secondary | ICD-10-CM | POA: Diagnosis not present

## 2014-03-12 DIAGNOSIS — I129 Hypertensive chronic kidney disease with stage 1 through stage 4 chronic kidney disease, or unspecified chronic kidney disease: Secondary | ICD-10-CM | POA: Diagnosis not present

## 2014-03-12 DIAGNOSIS — Z8673 Personal history of transient ischemic attack (TIA), and cerebral infarction without residual deficits: Secondary | ICD-10-CM | POA: Diagnosis not present

## 2014-03-12 DIAGNOSIS — N184 Chronic kidney disease, stage 4 (severe): Secondary | ICD-10-CM | POA: Diagnosis not present

## 2014-03-12 DIAGNOSIS — E213 Hyperparathyroidism, unspecified: Secondary | ICD-10-CM | POA: Diagnosis not present

## 2014-03-12 DIAGNOSIS — Z79899 Other long term (current) drug therapy: Secondary | ICD-10-CM | POA: Diagnosis not present

## 2014-03-24 DIAGNOSIS — E119 Type 2 diabetes mellitus without complications: Secondary | ICD-10-CM | POA: Diagnosis not present

## 2014-03-24 DIAGNOSIS — I120 Hypertensive chronic kidney disease with stage 5 chronic kidney disease or end stage renal disease: Secondary | ICD-10-CM | POA: Diagnosis not present

## 2014-03-24 DIAGNOSIS — I10 Essential (primary) hypertension: Secondary | ICD-10-CM | POA: Diagnosis not present

## 2014-04-03 DIAGNOSIS — N183 Chronic kidney disease, stage 3 (moderate): Secondary | ICD-10-CM | POA: Diagnosis not present

## 2014-04-30 DIAGNOSIS — B351 Tinea unguium: Secondary | ICD-10-CM | POA: Diagnosis not present

## 2014-04-30 DIAGNOSIS — E1142 Type 2 diabetes mellitus with diabetic polyneuropathy: Secondary | ICD-10-CM | POA: Diagnosis not present

## 2014-04-30 DIAGNOSIS — L84 Corns and callosities: Secondary | ICD-10-CM | POA: Diagnosis not present

## 2014-05-01 DIAGNOSIS — M25552 Pain in left hip: Secondary | ICD-10-CM | POA: Diagnosis not present

## 2014-05-01 DIAGNOSIS — I708 Atherosclerosis of other arteries: Secondary | ICD-10-CM | POA: Diagnosis not present

## 2014-05-03 DIAGNOSIS — Z87891 Personal history of nicotine dependence: Secondary | ICD-10-CM | POA: Diagnosis not present

## 2014-05-03 DIAGNOSIS — M5432 Sciatica, left side: Secondary | ICD-10-CM | POA: Diagnosis not present

## 2014-05-03 DIAGNOSIS — Z7982 Long term (current) use of aspirin: Secondary | ICD-10-CM | POA: Diagnosis not present

## 2014-05-03 DIAGNOSIS — I739 Peripheral vascular disease, unspecified: Secondary | ICD-10-CM | POA: Diagnosis not present

## 2014-05-03 DIAGNOSIS — Z940 Kidney transplant status: Secondary | ICD-10-CM | POA: Diagnosis not present

## 2014-05-03 DIAGNOSIS — Z79899 Other long term (current) drug therapy: Secondary | ICD-10-CM | POA: Diagnosis not present

## 2014-05-03 DIAGNOSIS — S70212A Abrasion, left hip, initial encounter: Secondary | ICD-10-CM | POA: Diagnosis not present

## 2014-05-03 DIAGNOSIS — Z794 Long term (current) use of insulin: Secondary | ICD-10-CM | POA: Diagnosis not present

## 2014-05-03 DIAGNOSIS — S3993XA Unspecified injury of pelvis, initial encounter: Secondary | ICD-10-CM | POA: Diagnosis not present

## 2014-05-03 DIAGNOSIS — E119 Type 2 diabetes mellitus without complications: Secondary | ICD-10-CM | POA: Diagnosis not present

## 2014-05-03 DIAGNOSIS — M25552 Pain in left hip: Secondary | ICD-10-CM | POA: Diagnosis not present

## 2014-05-03 DIAGNOSIS — I10 Essential (primary) hypertension: Secondary | ICD-10-CM | POA: Diagnosis not present

## 2014-05-11 DIAGNOSIS — M25552 Pain in left hip: Secondary | ICD-10-CM | POA: Diagnosis not present

## 2014-05-11 DIAGNOSIS — M5432 Sciatica, left side: Secondary | ICD-10-CM | POA: Diagnosis not present

## 2014-06-01 DIAGNOSIS — N189 Chronic kidney disease, unspecified: Secondary | ICD-10-CM | POA: Diagnosis not present

## 2014-06-30 DIAGNOSIS — Z79899 Other long term (current) drug therapy: Secondary | ICD-10-CM | POA: Diagnosis not present

## 2014-06-30 DIAGNOSIS — Z940 Kidney transplant status: Secondary | ICD-10-CM | POA: Diagnosis not present

## 2014-07-16 DIAGNOSIS — E119 Type 2 diabetes mellitus without complications: Secondary | ICD-10-CM | POA: Diagnosis not present

## 2014-07-16 DIAGNOSIS — M5432 Sciatica, left side: Secondary | ICD-10-CM | POA: Diagnosis not present

## 2014-07-16 DIAGNOSIS — I10 Essential (primary) hypertension: Secondary | ICD-10-CM | POA: Diagnosis not present

## 2014-07-16 DIAGNOSIS — Z0000 Encounter for general adult medical examination without abnormal findings: Secondary | ICD-10-CM | POA: Diagnosis not present

## 2014-07-16 DIAGNOSIS — Z1389 Encounter for screening for other disorder: Secondary | ICD-10-CM | POA: Diagnosis not present

## 2014-07-28 DIAGNOSIS — L84 Corns and callosities: Secondary | ICD-10-CM | POA: Diagnosis not present

## 2014-07-28 DIAGNOSIS — M4806 Spinal stenosis, lumbar region: Secondary | ICD-10-CM | POA: Diagnosis not present

## 2014-07-28 DIAGNOSIS — M5432 Sciatica, left side: Secondary | ICD-10-CM | POA: Diagnosis not present

## 2014-07-28 DIAGNOSIS — M47816 Spondylosis without myelopathy or radiculopathy, lumbar region: Secondary | ICD-10-CM | POA: Diagnosis not present

## 2014-07-28 DIAGNOSIS — R937 Abnormal findings on diagnostic imaging of other parts of musculoskeletal system: Secondary | ICD-10-CM | POA: Diagnosis not present

## 2014-07-28 DIAGNOSIS — Z981 Arthrodesis status: Secondary | ICD-10-CM | POA: Diagnosis not present

## 2014-07-28 DIAGNOSIS — B351 Tinea unguium: Secondary | ICD-10-CM | POA: Diagnosis not present

## 2014-07-28 DIAGNOSIS — E1151 Type 2 diabetes mellitus with diabetic peripheral angiopathy without gangrene: Secondary | ICD-10-CM | POA: Diagnosis not present

## 2014-08-21 DIAGNOSIS — L309 Dermatitis, unspecified: Secondary | ICD-10-CM | POA: Diagnosis not present

## 2014-09-14 DIAGNOSIS — Z981 Arthrodesis status: Secondary | ICD-10-CM | POA: Diagnosis not present

## 2014-09-14 DIAGNOSIS — M5136 Other intervertebral disc degeneration, lumbar region: Secondary | ICD-10-CM | POA: Diagnosis not present

## 2014-09-14 DIAGNOSIS — Z87891 Personal history of nicotine dependence: Secondary | ICD-10-CM | POA: Diagnosis not present

## 2014-09-14 DIAGNOSIS — M419 Scoliosis, unspecified: Secondary | ICD-10-CM | POA: Diagnosis not present

## 2014-09-22 DIAGNOSIS — Z940 Kidney transplant status: Secondary | ICD-10-CM | POA: Diagnosis not present

## 2014-09-22 DIAGNOSIS — Z79899 Other long term (current) drug therapy: Secondary | ICD-10-CM | POA: Diagnosis not present

## 2014-09-25 DIAGNOSIS — M5126 Other intervertebral disc displacement, lumbar region: Secondary | ICD-10-CM | POA: Diagnosis not present

## 2014-09-25 DIAGNOSIS — Z940 Kidney transplant status: Secondary | ICD-10-CM | POA: Diagnosis not present

## 2014-09-25 DIAGNOSIS — M4806 Spinal stenosis, lumbar region: Secondary | ICD-10-CM | POA: Diagnosis not present

## 2014-09-25 DIAGNOSIS — Z981 Arthrodesis status: Secondary | ICD-10-CM | POA: Diagnosis not present

## 2014-09-25 DIAGNOSIS — M419 Scoliosis, unspecified: Secondary | ICD-10-CM | POA: Diagnosis not present

## 2014-10-06 DIAGNOSIS — E1151 Type 2 diabetes mellitus with diabetic peripheral angiopathy without gangrene: Secondary | ICD-10-CM | POA: Diagnosis not present

## 2014-10-06 DIAGNOSIS — B351 Tinea unguium: Secondary | ICD-10-CM | POA: Diagnosis not present

## 2014-10-06 DIAGNOSIS — L84 Corns and callosities: Secondary | ICD-10-CM | POA: Diagnosis not present

## 2014-10-07 DIAGNOSIS — Z981 Arthrodesis status: Secondary | ICD-10-CM | POA: Diagnosis not present

## 2014-10-07 DIAGNOSIS — Z87891 Personal history of nicotine dependence: Secondary | ICD-10-CM | POA: Diagnosis not present

## 2014-10-07 DIAGNOSIS — Z4789 Encounter for other orthopedic aftercare: Secondary | ICD-10-CM | POA: Diagnosis not present

## 2014-10-07 DIAGNOSIS — M5126 Other intervertebral disc displacement, lumbar region: Secondary | ICD-10-CM | POA: Diagnosis not present

## 2014-10-07 DIAGNOSIS — M4316 Spondylolisthesis, lumbar region: Secondary | ICD-10-CM | POA: Diagnosis not present

## 2014-10-07 DIAGNOSIS — M4806 Spinal stenosis, lumbar region: Secondary | ICD-10-CM | POA: Diagnosis not present

## 2014-10-19 DIAGNOSIS — L03818 Cellulitis of other sites: Secondary | ICD-10-CM | POA: Diagnosis not present

## 2014-10-21 DIAGNOSIS — Z79899 Other long term (current) drug therapy: Secondary | ICD-10-CM | POA: Diagnosis not present

## 2014-10-21 DIAGNOSIS — Z940 Kidney transplant status: Secondary | ICD-10-CM | POA: Diagnosis not present

## 2014-10-23 DIAGNOSIS — Z01810 Encounter for preprocedural cardiovascular examination: Secondary | ICD-10-CM | POA: Diagnosis not present

## 2014-10-29 DIAGNOSIS — Z955 Presence of coronary angioplasty implant and graft: Secondary | ICD-10-CM | POA: Diagnosis not present

## 2014-10-29 DIAGNOSIS — M5126 Other intervertebral disc displacement, lumbar region: Secondary | ICD-10-CM | POA: Diagnosis not present

## 2014-10-29 DIAGNOSIS — E119 Type 2 diabetes mellitus without complications: Secondary | ICD-10-CM | POA: Diagnosis not present

## 2014-10-29 DIAGNOSIS — I10 Essential (primary) hypertension: Secondary | ICD-10-CM | POA: Diagnosis present

## 2014-10-29 DIAGNOSIS — Z981 Arthrodesis status: Secondary | ICD-10-CM | POA: Diagnosis not present

## 2014-10-29 DIAGNOSIS — M4316 Spondylolisthesis, lumbar region: Secondary | ICD-10-CM | POA: Diagnosis not present

## 2014-10-29 DIAGNOSIS — M545 Low back pain: Secondary | ICD-10-CM | POA: Diagnosis not present

## 2014-10-29 DIAGNOSIS — Z940 Kidney transplant status: Secondary | ICD-10-CM | POA: Diagnosis not present

## 2014-10-29 DIAGNOSIS — R296 Repeated falls: Secondary | ICD-10-CM | POA: Diagnosis present

## 2014-10-29 DIAGNOSIS — E785 Hyperlipidemia, unspecified: Secondary | ICD-10-CM | POA: Diagnosis present

## 2014-10-29 DIAGNOSIS — Z7982 Long term (current) use of aspirin: Secondary | ICD-10-CM | POA: Diagnosis not present

## 2014-10-29 DIAGNOSIS — M5136 Other intervertebral disc degeneration, lumbar region: Secondary | ICD-10-CM | POA: Diagnosis not present

## 2014-10-29 DIAGNOSIS — Z803 Family history of malignant neoplasm of breast: Secondary | ICD-10-CM | POA: Diagnosis not present

## 2014-10-29 DIAGNOSIS — I2510 Atherosclerotic heart disease of native coronary artery without angina pectoris: Secondary | ICD-10-CM | POA: Diagnosis present

## 2014-10-29 DIAGNOSIS — M4806 Spinal stenosis, lumbar region: Secondary | ICD-10-CM | POA: Diagnosis present

## 2014-10-29 DIAGNOSIS — R2689 Other abnormalities of gait and mobility: Secondary | ICD-10-CM | POA: Diagnosis not present

## 2014-10-29 DIAGNOSIS — I4892 Unspecified atrial flutter: Secondary | ICD-10-CM | POA: Diagnosis not present

## 2014-10-29 DIAGNOSIS — Z8249 Family history of ischemic heart disease and other diseases of the circulatory system: Secondary | ICD-10-CM | POA: Diagnosis not present

## 2014-10-29 DIAGNOSIS — Z8673 Personal history of transient ischemic attack (TIA), and cerebral infarction without residual deficits: Secondary | ICD-10-CM | POA: Diagnosis not present

## 2014-10-29 DIAGNOSIS — N185 Chronic kidney disease, stage 5: Secondary | ICD-10-CM | POA: Diagnosis not present

## 2014-10-29 DIAGNOSIS — Z4789 Encounter for other orthopedic aftercare: Secondary | ICD-10-CM | POA: Diagnosis not present

## 2014-10-29 DIAGNOSIS — I7389 Other specified peripheral vascular diseases: Secondary | ICD-10-CM | POA: Diagnosis not present

## 2014-10-29 DIAGNOSIS — E1122 Type 2 diabetes mellitus with diabetic chronic kidney disease: Secondary | ICD-10-CM | POA: Diagnosis present

## 2014-10-29 DIAGNOSIS — M6281 Muscle weakness (generalized): Secondary | ICD-10-CM | POA: Diagnosis not present

## 2014-10-29 DIAGNOSIS — I129 Hypertensive chronic kidney disease with stage 1 through stage 4 chronic kidney disease, or unspecified chronic kidney disease: Secondary | ICD-10-CM | POA: Diagnosis not present

## 2014-10-29 DIAGNOSIS — I25118 Atherosclerotic heart disease of native coronary artery with other forms of angina pectoris: Secondary | ICD-10-CM | POA: Diagnosis not present

## 2014-10-29 DIAGNOSIS — M5137 Other intervertebral disc degeneration, lumbosacral region: Secondary | ICD-10-CM | POA: Diagnosis not present

## 2014-10-29 DIAGNOSIS — Z87891 Personal history of nicotine dependence: Secondary | ICD-10-CM | POA: Diagnosis not present

## 2014-10-29 DIAGNOSIS — N184 Chronic kidney disease, stage 4 (severe): Secondary | ICD-10-CM | POA: Diagnosis not present

## 2014-10-29 DIAGNOSIS — Z823 Family history of stroke: Secondary | ICD-10-CM | POA: Diagnosis not present

## 2014-11-03 DIAGNOSIS — I4892 Unspecified atrial flutter: Secondary | ICD-10-CM | POA: Diagnosis not present

## 2014-11-03 DIAGNOSIS — E119 Type 2 diabetes mellitus without complications: Secondary | ICD-10-CM | POA: Diagnosis not present

## 2014-11-03 DIAGNOSIS — Z8679 Personal history of other diseases of the circulatory system: Secondary | ICD-10-CM | POA: Diagnosis not present

## 2014-11-03 DIAGNOSIS — Z4822 Encounter for aftercare following kidney transplant: Secondary | ICD-10-CM | POA: Diagnosis not present

## 2014-11-03 DIAGNOSIS — M545 Low back pain: Secondary | ICD-10-CM | POA: Diagnosis not present

## 2014-11-03 DIAGNOSIS — Z87891 Personal history of nicotine dependence: Secondary | ICD-10-CM | POA: Diagnosis not present

## 2014-11-03 DIAGNOSIS — M6281 Muscle weakness (generalized): Secondary | ICD-10-CM | POA: Diagnosis not present

## 2014-11-03 DIAGNOSIS — N184 Chronic kidney disease, stage 4 (severe): Secondary | ICD-10-CM | POA: Diagnosis not present

## 2014-11-03 DIAGNOSIS — D899 Disorder involving the immune mechanism, unspecified: Secondary | ICD-10-CM | POA: Diagnosis not present

## 2014-11-03 DIAGNOSIS — E1122 Type 2 diabetes mellitus with diabetic chronic kidney disease: Secondary | ICD-10-CM | POA: Diagnosis not present

## 2014-11-03 DIAGNOSIS — M9973 Connective tissue and disc stenosis of intervertebral foramina of lumbar region: Secondary | ICD-10-CM | POA: Diagnosis not present

## 2014-11-03 DIAGNOSIS — I7389 Other specified peripheral vascular diseases: Secondary | ICD-10-CM | POA: Diagnosis not present

## 2014-11-03 DIAGNOSIS — R2689 Other abnormalities of gait and mobility: Secondary | ICD-10-CM | POA: Diagnosis not present

## 2014-11-03 DIAGNOSIS — Z940 Kidney transplant status: Secondary | ICD-10-CM | POA: Diagnosis not present

## 2014-11-03 DIAGNOSIS — I129 Hypertensive chronic kidney disease with stage 1 through stage 4 chronic kidney disease, or unspecified chronic kidney disease: Secondary | ICD-10-CM | POA: Diagnosis not present

## 2014-11-03 DIAGNOSIS — Z4789 Encounter for other orthopedic aftercare: Secondary | ICD-10-CM | POA: Diagnosis not present

## 2014-11-03 DIAGNOSIS — Z8673 Personal history of transient ischemic attack (TIA), and cerebral infarction without residual deficits: Secondary | ICD-10-CM | POA: Diagnosis not present

## 2014-11-03 DIAGNOSIS — M47816 Spondylosis without myelopathy or radiculopathy, lumbar region: Secondary | ICD-10-CM | POA: Diagnosis not present

## 2014-11-03 DIAGNOSIS — M5136 Other intervertebral disc degeneration, lumbar region: Secondary | ICD-10-CM | POA: Diagnosis not present

## 2014-11-03 DIAGNOSIS — Z880 Allergy status to penicillin: Secondary | ICD-10-CM | POA: Diagnosis not present

## 2014-11-03 DIAGNOSIS — K432 Incisional hernia without obstruction or gangrene: Secondary | ICD-10-CM | POA: Diagnosis not present

## 2014-11-03 DIAGNOSIS — I25118 Atherosclerotic heart disease of native coronary artery with other forms of angina pectoris: Secondary | ICD-10-CM | POA: Diagnosis not present

## 2014-11-03 DIAGNOSIS — Z981 Arthrodesis status: Secondary | ICD-10-CM | POA: Diagnosis not present

## 2014-11-03 DIAGNOSIS — Z7982 Long term (current) use of aspirin: Secondary | ICD-10-CM | POA: Diagnosis not present

## 2014-11-03 DIAGNOSIS — Z8639 Personal history of other endocrine, nutritional and metabolic disease: Secondary | ICD-10-CM | POA: Diagnosis not present

## 2014-11-03 DIAGNOSIS — N281 Cyst of kidney, acquired: Secondary | ICD-10-CM | POA: Diagnosis not present

## 2014-11-03 DIAGNOSIS — I2510 Atherosclerotic heart disease of native coronary artery without angina pectoris: Secondary | ICD-10-CM | POA: Diagnosis not present

## 2014-11-03 DIAGNOSIS — N185 Chronic kidney disease, stage 5: Secondary | ICD-10-CM | POA: Diagnosis not present

## 2014-11-03 DIAGNOSIS — Z79899 Other long term (current) drug therapy: Secondary | ICD-10-CM | POA: Diagnosis not present

## 2014-11-03 DIAGNOSIS — E785 Hyperlipidemia, unspecified: Secondary | ICD-10-CM | POA: Diagnosis not present

## 2014-11-03 DIAGNOSIS — Z7952 Long term (current) use of systemic steroids: Secondary | ICD-10-CM | POA: Diagnosis not present

## 2014-11-19 DIAGNOSIS — Z4822 Encounter for aftercare following kidney transplant: Secondary | ICD-10-CM | POA: Diagnosis not present

## 2014-11-19 DIAGNOSIS — E1122 Type 2 diabetes mellitus with diabetic chronic kidney disease: Secondary | ICD-10-CM | POA: Diagnosis not present

## 2014-11-19 DIAGNOSIS — Z940 Kidney transplant status: Secondary | ICD-10-CM | POA: Diagnosis not present

## 2014-11-19 DIAGNOSIS — D899 Disorder involving the immune mechanism, unspecified: Secondary | ICD-10-CM | POA: Diagnosis not present

## 2014-11-19 DIAGNOSIS — N184 Chronic kidney disease, stage 4 (severe): Secondary | ICD-10-CM | POA: Diagnosis not present

## 2014-11-19 DIAGNOSIS — Z79899 Other long term (current) drug therapy: Secondary | ICD-10-CM | POA: Diagnosis not present

## 2014-11-19 DIAGNOSIS — N281 Cyst of kidney, acquired: Secondary | ICD-10-CM | POA: Diagnosis not present

## 2014-11-19 DIAGNOSIS — E785 Hyperlipidemia, unspecified: Secondary | ICD-10-CM | POA: Diagnosis not present

## 2014-11-19 DIAGNOSIS — Z8639 Personal history of other endocrine, nutritional and metabolic disease: Secondary | ICD-10-CM | POA: Diagnosis not present

## 2014-11-19 DIAGNOSIS — I129 Hypertensive chronic kidney disease with stage 1 through stage 4 chronic kidney disease, or unspecified chronic kidney disease: Secondary | ICD-10-CM | POA: Diagnosis not present

## 2014-11-30 DIAGNOSIS — Z4789 Encounter for other orthopedic aftercare: Secondary | ICD-10-CM | POA: Diagnosis not present

## 2014-11-30 DIAGNOSIS — Z87891 Personal history of nicotine dependence: Secondary | ICD-10-CM | POA: Diagnosis not present

## 2014-11-30 DIAGNOSIS — Z981 Arthrodesis status: Secondary | ICD-10-CM | POA: Diagnosis not present

## 2014-12-15 DIAGNOSIS — H548 Legal blindness, as defined in USA: Secondary | ICD-10-CM | POA: Diagnosis not present

## 2014-12-15 DIAGNOSIS — I2510 Atherosclerotic heart disease of native coronary artery without angina pectoris: Secondary | ICD-10-CM | POA: Diagnosis not present

## 2014-12-15 DIAGNOSIS — E1151 Type 2 diabetes mellitus with diabetic peripheral angiopathy without gangrene: Secondary | ICD-10-CM | POA: Diagnosis not present

## 2014-12-15 DIAGNOSIS — N189 Chronic kidney disease, unspecified: Secondary | ICD-10-CM | POA: Diagnosis not present

## 2014-12-15 DIAGNOSIS — M5136 Other intervertebral disc degeneration, lumbar region: Secondary | ICD-10-CM | POA: Diagnosis not present

## 2014-12-15 DIAGNOSIS — I129 Hypertensive chronic kidney disease with stage 1 through stage 4 chronic kidney disease, or unspecified chronic kidney disease: Secondary | ICD-10-CM | POA: Diagnosis not present

## 2014-12-15 DIAGNOSIS — Z7982 Long term (current) use of aspirin: Secondary | ICD-10-CM | POA: Diagnosis not present

## 2014-12-15 DIAGNOSIS — E1122 Type 2 diabetes mellitus with diabetic chronic kidney disease: Secondary | ICD-10-CM | POA: Diagnosis not present

## 2014-12-15 DIAGNOSIS — E785 Hyperlipidemia, unspecified: Secondary | ICD-10-CM | POA: Diagnosis not present

## 2014-12-15 DIAGNOSIS — Z794 Long term (current) use of insulin: Secondary | ICD-10-CM | POA: Diagnosis not present

## 2014-12-15 DIAGNOSIS — Z4889 Encounter for other specified surgical aftercare: Secondary | ICD-10-CM | POA: Diagnosis not present

## 2014-12-15 DIAGNOSIS — Z7952 Long term (current) use of systemic steroids: Secondary | ICD-10-CM | POA: Diagnosis not present

## 2014-12-15 DIAGNOSIS — I4892 Unspecified atrial flutter: Secondary | ICD-10-CM | POA: Diagnosis not present

## 2014-12-15 DIAGNOSIS — Z940 Kidney transplant status: Secondary | ICD-10-CM | POA: Diagnosis not present

## 2014-12-15 DIAGNOSIS — N2889 Other specified disorders of kidney and ureter: Secondary | ICD-10-CM | POA: Diagnosis not present

## 2014-12-15 DIAGNOSIS — Z9181 History of falling: Secondary | ICD-10-CM | POA: Diagnosis not present

## 2014-12-18 DIAGNOSIS — M545 Low back pain: Secondary | ICD-10-CM | POA: Diagnosis not present

## 2014-12-18 DIAGNOSIS — I10 Essential (primary) hypertension: Secondary | ICD-10-CM | POA: Diagnosis not present

## 2014-12-22 DIAGNOSIS — M5136 Other intervertebral disc degeneration, lumbar region: Secondary | ICD-10-CM | POA: Diagnosis not present

## 2014-12-22 DIAGNOSIS — E1122 Type 2 diabetes mellitus with diabetic chronic kidney disease: Secondary | ICD-10-CM | POA: Diagnosis not present

## 2014-12-22 DIAGNOSIS — I129 Hypertensive chronic kidney disease with stage 1 through stage 4 chronic kidney disease, or unspecified chronic kidney disease: Secondary | ICD-10-CM | POA: Diagnosis not present

## 2014-12-22 DIAGNOSIS — I2510 Atherosclerotic heart disease of native coronary artery without angina pectoris: Secondary | ICD-10-CM | POA: Diagnosis not present

## 2014-12-22 DIAGNOSIS — Z4889 Encounter for other specified surgical aftercare: Secondary | ICD-10-CM | POA: Diagnosis not present

## 2014-12-22 DIAGNOSIS — N189 Chronic kidney disease, unspecified: Secondary | ICD-10-CM | POA: Diagnosis not present

## 2014-12-23 DIAGNOSIS — I129 Hypertensive chronic kidney disease with stage 1 through stage 4 chronic kidney disease, or unspecified chronic kidney disease: Secondary | ICD-10-CM | POA: Diagnosis not present

## 2014-12-23 DIAGNOSIS — Z4889 Encounter for other specified surgical aftercare: Secondary | ICD-10-CM | POA: Diagnosis not present

## 2014-12-23 DIAGNOSIS — N189 Chronic kidney disease, unspecified: Secondary | ICD-10-CM | POA: Diagnosis not present

## 2014-12-23 DIAGNOSIS — I2510 Atherosclerotic heart disease of native coronary artery without angina pectoris: Secondary | ICD-10-CM | POA: Diagnosis not present

## 2014-12-23 DIAGNOSIS — M5136 Other intervertebral disc degeneration, lumbar region: Secondary | ICD-10-CM | POA: Diagnosis not present

## 2014-12-23 DIAGNOSIS — E1122 Type 2 diabetes mellitus with diabetic chronic kidney disease: Secondary | ICD-10-CM | POA: Diagnosis not present

## 2014-12-24 DIAGNOSIS — N189 Chronic kidney disease, unspecified: Secondary | ICD-10-CM | POA: Diagnosis not present

## 2014-12-24 DIAGNOSIS — E1122 Type 2 diabetes mellitus with diabetic chronic kidney disease: Secondary | ICD-10-CM | POA: Diagnosis not present

## 2014-12-24 DIAGNOSIS — I129 Hypertensive chronic kidney disease with stage 1 through stage 4 chronic kidney disease, or unspecified chronic kidney disease: Secondary | ICD-10-CM | POA: Diagnosis not present

## 2014-12-24 DIAGNOSIS — M5136 Other intervertebral disc degeneration, lumbar region: Secondary | ICD-10-CM | POA: Diagnosis not present

## 2014-12-24 DIAGNOSIS — Z4889 Encounter for other specified surgical aftercare: Secondary | ICD-10-CM | POA: Diagnosis not present

## 2014-12-24 DIAGNOSIS — I2510 Atherosclerotic heart disease of native coronary artery without angina pectoris: Secondary | ICD-10-CM | POA: Diagnosis not present

## 2014-12-25 DIAGNOSIS — M5136 Other intervertebral disc degeneration, lumbar region: Secondary | ICD-10-CM | POA: Diagnosis not present

## 2014-12-25 DIAGNOSIS — I2510 Atherosclerotic heart disease of native coronary artery without angina pectoris: Secondary | ICD-10-CM | POA: Diagnosis not present

## 2014-12-25 DIAGNOSIS — Z4889 Encounter for other specified surgical aftercare: Secondary | ICD-10-CM | POA: Diagnosis not present

## 2014-12-25 DIAGNOSIS — I129 Hypertensive chronic kidney disease with stage 1 through stage 4 chronic kidney disease, or unspecified chronic kidney disease: Secondary | ICD-10-CM | POA: Diagnosis not present

## 2014-12-25 DIAGNOSIS — N189 Chronic kidney disease, unspecified: Secondary | ICD-10-CM | POA: Diagnosis not present

## 2014-12-25 DIAGNOSIS — E1122 Type 2 diabetes mellitus with diabetic chronic kidney disease: Secondary | ICD-10-CM | POA: Diagnosis not present

## 2014-12-29 DIAGNOSIS — Z4889 Encounter for other specified surgical aftercare: Secondary | ICD-10-CM | POA: Diagnosis not present

## 2014-12-29 DIAGNOSIS — E1122 Type 2 diabetes mellitus with diabetic chronic kidney disease: Secondary | ICD-10-CM | POA: Diagnosis not present

## 2014-12-29 DIAGNOSIS — I129 Hypertensive chronic kidney disease with stage 1 through stage 4 chronic kidney disease, or unspecified chronic kidney disease: Secondary | ICD-10-CM | POA: Diagnosis not present

## 2014-12-29 DIAGNOSIS — I2510 Atherosclerotic heart disease of native coronary artery without angina pectoris: Secondary | ICD-10-CM | POA: Diagnosis not present

## 2014-12-29 DIAGNOSIS — N189 Chronic kidney disease, unspecified: Secondary | ICD-10-CM | POA: Diagnosis not present

## 2014-12-29 DIAGNOSIS — M5136 Other intervertebral disc degeneration, lumbar region: Secondary | ICD-10-CM | POA: Diagnosis not present

## 2014-12-30 DIAGNOSIS — I2510 Atherosclerotic heart disease of native coronary artery without angina pectoris: Secondary | ICD-10-CM | POA: Diagnosis not present

## 2014-12-30 DIAGNOSIS — I129 Hypertensive chronic kidney disease with stage 1 through stage 4 chronic kidney disease, or unspecified chronic kidney disease: Secondary | ICD-10-CM | POA: Diagnosis not present

## 2014-12-30 DIAGNOSIS — M5136 Other intervertebral disc degeneration, lumbar region: Secondary | ICD-10-CM | POA: Diagnosis not present

## 2014-12-30 DIAGNOSIS — N189 Chronic kidney disease, unspecified: Secondary | ICD-10-CM | POA: Diagnosis not present

## 2014-12-30 DIAGNOSIS — Z4889 Encounter for other specified surgical aftercare: Secondary | ICD-10-CM | POA: Diagnosis not present

## 2014-12-30 DIAGNOSIS — E1122 Type 2 diabetes mellitus with diabetic chronic kidney disease: Secondary | ICD-10-CM | POA: Diagnosis not present

## 2014-12-31 DIAGNOSIS — N189 Chronic kidney disease, unspecified: Secondary | ICD-10-CM | POA: Diagnosis not present

## 2014-12-31 DIAGNOSIS — E1122 Type 2 diabetes mellitus with diabetic chronic kidney disease: Secondary | ICD-10-CM | POA: Diagnosis not present

## 2014-12-31 DIAGNOSIS — Z4889 Encounter for other specified surgical aftercare: Secondary | ICD-10-CM | POA: Diagnosis not present

## 2014-12-31 DIAGNOSIS — M5136 Other intervertebral disc degeneration, lumbar region: Secondary | ICD-10-CM | POA: Diagnosis not present

## 2014-12-31 DIAGNOSIS — I129 Hypertensive chronic kidney disease with stage 1 through stage 4 chronic kidney disease, or unspecified chronic kidney disease: Secondary | ICD-10-CM | POA: Diagnosis not present

## 2014-12-31 DIAGNOSIS — I2510 Atherosclerotic heart disease of native coronary artery without angina pectoris: Secondary | ICD-10-CM | POA: Diagnosis not present

## 2015-01-04 DIAGNOSIS — M5136 Other intervertebral disc degeneration, lumbar region: Secondary | ICD-10-CM | POA: Diagnosis not present

## 2015-01-04 DIAGNOSIS — I2510 Atherosclerotic heart disease of native coronary artery without angina pectoris: Secondary | ICD-10-CM | POA: Diagnosis not present

## 2015-01-04 DIAGNOSIS — Z79899 Other long term (current) drug therapy: Secondary | ICD-10-CM | POA: Diagnosis not present

## 2015-01-04 DIAGNOSIS — Z940 Kidney transplant status: Secondary | ICD-10-CM | POA: Diagnosis not present

## 2015-01-04 DIAGNOSIS — E1122 Type 2 diabetes mellitus with diabetic chronic kidney disease: Secondary | ICD-10-CM | POA: Diagnosis not present

## 2015-01-04 DIAGNOSIS — Z4889 Encounter for other specified surgical aftercare: Secondary | ICD-10-CM | POA: Diagnosis not present

## 2015-01-04 DIAGNOSIS — I129 Hypertensive chronic kidney disease with stage 1 through stage 4 chronic kidney disease, or unspecified chronic kidney disease: Secondary | ICD-10-CM | POA: Diagnosis not present

## 2015-01-04 DIAGNOSIS — N189 Chronic kidney disease, unspecified: Secondary | ICD-10-CM | POA: Diagnosis not present

## 2015-01-06 DIAGNOSIS — I739 Peripheral vascular disease, unspecified: Secondary | ICD-10-CM | POA: Diagnosis not present

## 2015-01-06 DIAGNOSIS — Z7952 Long term (current) use of systemic steroids: Secondary | ICD-10-CM | POA: Diagnosis not present

## 2015-01-06 DIAGNOSIS — E1122 Type 2 diabetes mellitus with diabetic chronic kidney disease: Secondary | ICD-10-CM | POA: Diagnosis not present

## 2015-01-06 DIAGNOSIS — R600 Localized edema: Secondary | ICD-10-CM | POA: Diagnosis not present

## 2015-01-06 DIAGNOSIS — Z880 Allergy status to penicillin: Secondary | ICD-10-CM | POA: Diagnosis not present

## 2015-01-06 DIAGNOSIS — I129 Hypertensive chronic kidney disease with stage 1 through stage 4 chronic kidney disease, or unspecified chronic kidney disease: Secondary | ICD-10-CM | POA: Diagnosis not present

## 2015-01-06 DIAGNOSIS — Z7982 Long term (current) use of aspirin: Secondary | ICD-10-CM | POA: Diagnosis not present

## 2015-01-06 DIAGNOSIS — I998 Other disorder of circulatory system: Secondary | ICD-10-CM | POA: Diagnosis not present

## 2015-01-06 DIAGNOSIS — M79604 Pain in right leg: Secondary | ICD-10-CM | POA: Diagnosis not present

## 2015-01-06 DIAGNOSIS — I2510 Atherosclerotic heart disease of native coronary artery without angina pectoris: Secondary | ICD-10-CM | POA: Diagnosis not present

## 2015-01-06 DIAGNOSIS — M5136 Other intervertebral disc degeneration, lumbar region: Secondary | ICD-10-CM | POA: Diagnosis not present

## 2015-01-06 DIAGNOSIS — R7989 Other specified abnormal findings of blood chemistry: Secondary | ICD-10-CM | POA: Diagnosis not present

## 2015-01-06 DIAGNOSIS — N189 Chronic kidney disease, unspecified: Secondary | ICD-10-CM | POA: Diagnosis not present

## 2015-01-06 DIAGNOSIS — I7389 Other specified peripheral vascular diseases: Secondary | ICD-10-CM | POA: Diagnosis not present

## 2015-01-06 DIAGNOSIS — M479 Spondylosis, unspecified: Secondary | ICD-10-CM | POA: Diagnosis not present

## 2015-01-06 DIAGNOSIS — Z4889 Encounter for other specified surgical aftercare: Secondary | ICD-10-CM | POA: Diagnosis not present

## 2015-01-06 DIAGNOSIS — M7989 Other specified soft tissue disorders: Secondary | ICD-10-CM | POA: Diagnosis not present

## 2015-01-06 DIAGNOSIS — Z79899 Other long term (current) drug therapy: Secondary | ICD-10-CM | POA: Diagnosis not present

## 2015-01-06 DIAGNOSIS — M4306 Spondylolysis, lumbar region: Secondary | ICD-10-CM | POA: Diagnosis not present

## 2015-01-06 DIAGNOSIS — Z794 Long term (current) use of insulin: Secondary | ICD-10-CM | POA: Diagnosis not present

## 2015-01-07 DIAGNOSIS — I998 Other disorder of circulatory system: Secondary | ICD-10-CM | POA: Diagnosis not present

## 2015-01-07 DIAGNOSIS — I7389 Other specified peripheral vascular diseases: Secondary | ICD-10-CM | POA: Diagnosis not present

## 2015-01-07 DIAGNOSIS — M7989 Other specified soft tissue disorders: Secondary | ICD-10-CM | POA: Diagnosis not present

## 2015-01-07 DIAGNOSIS — M4306 Spondylolysis, lumbar region: Secondary | ICD-10-CM | POA: Diagnosis not present

## 2015-01-07 DIAGNOSIS — I2510 Atherosclerotic heart disease of native coronary artery without angina pectoris: Secondary | ICD-10-CM | POA: Diagnosis not present

## 2015-01-07 DIAGNOSIS — M79605 Pain in left leg: Secondary | ICD-10-CM | POA: Diagnosis not present

## 2015-01-07 DIAGNOSIS — R600 Localized edema: Secondary | ICD-10-CM | POA: Diagnosis not present

## 2015-01-12 DIAGNOSIS — L84 Corns and callosities: Secondary | ICD-10-CM | POA: Diagnosis not present

## 2015-01-12 DIAGNOSIS — E1151 Type 2 diabetes mellitus with diabetic peripheral angiopathy without gangrene: Secondary | ICD-10-CM | POA: Diagnosis not present

## 2015-01-12 DIAGNOSIS — N189 Chronic kidney disease, unspecified: Secondary | ICD-10-CM | POA: Diagnosis not present

## 2015-01-12 DIAGNOSIS — I2510 Atherosclerotic heart disease of native coronary artery without angina pectoris: Secondary | ICD-10-CM | POA: Diagnosis not present

## 2015-01-12 DIAGNOSIS — B351 Tinea unguium: Secondary | ICD-10-CM | POA: Diagnosis not present

## 2015-01-12 DIAGNOSIS — I129 Hypertensive chronic kidney disease with stage 1 through stage 4 chronic kidney disease, or unspecified chronic kidney disease: Secondary | ICD-10-CM | POA: Diagnosis not present

## 2015-01-12 DIAGNOSIS — Z4889 Encounter for other specified surgical aftercare: Secondary | ICD-10-CM | POA: Diagnosis not present

## 2015-01-12 DIAGNOSIS — M5136 Other intervertebral disc degeneration, lumbar region: Secondary | ICD-10-CM | POA: Diagnosis not present

## 2015-01-12 DIAGNOSIS — E1122 Type 2 diabetes mellitus with diabetic chronic kidney disease: Secondary | ICD-10-CM | POA: Diagnosis not present

## 2015-01-19 DIAGNOSIS — E1122 Type 2 diabetes mellitus with diabetic chronic kidney disease: Secondary | ICD-10-CM | POA: Diagnosis not present

## 2015-01-19 DIAGNOSIS — R600 Localized edema: Secondary | ICD-10-CM | POA: Diagnosis not present

## 2015-01-19 DIAGNOSIS — I10 Essential (primary) hypertension: Secondary | ICD-10-CM | POA: Diagnosis not present

## 2015-01-20 DIAGNOSIS — M5136 Other intervertebral disc degeneration, lumbar region: Secondary | ICD-10-CM | POA: Diagnosis not present

## 2015-01-20 DIAGNOSIS — I129 Hypertensive chronic kidney disease with stage 1 through stage 4 chronic kidney disease, or unspecified chronic kidney disease: Secondary | ICD-10-CM | POA: Diagnosis not present

## 2015-01-20 DIAGNOSIS — N189 Chronic kidney disease, unspecified: Secondary | ICD-10-CM | POA: Diagnosis not present

## 2015-01-20 DIAGNOSIS — Z4889 Encounter for other specified surgical aftercare: Secondary | ICD-10-CM | POA: Diagnosis not present

## 2015-01-20 DIAGNOSIS — I2510 Atherosclerotic heart disease of native coronary artery without angina pectoris: Secondary | ICD-10-CM | POA: Diagnosis not present

## 2015-01-20 DIAGNOSIS — E1122 Type 2 diabetes mellitus with diabetic chronic kidney disease: Secondary | ICD-10-CM | POA: Diagnosis not present

## 2015-01-28 DIAGNOSIS — I129 Hypertensive chronic kidney disease with stage 1 through stage 4 chronic kidney disease, or unspecified chronic kidney disease: Secondary | ICD-10-CM | POA: Diagnosis not present

## 2015-01-28 DIAGNOSIS — M5136 Other intervertebral disc degeneration, lumbar region: Secondary | ICD-10-CM | POA: Diagnosis not present

## 2015-01-28 DIAGNOSIS — I2510 Atherosclerotic heart disease of native coronary artery without angina pectoris: Secondary | ICD-10-CM | POA: Diagnosis not present

## 2015-01-28 DIAGNOSIS — E1122 Type 2 diabetes mellitus with diabetic chronic kidney disease: Secondary | ICD-10-CM | POA: Diagnosis not present

## 2015-01-28 DIAGNOSIS — Z4889 Encounter for other specified surgical aftercare: Secondary | ICD-10-CM | POA: Diagnosis not present

## 2015-01-28 DIAGNOSIS — N189 Chronic kidney disease, unspecified: Secondary | ICD-10-CM | POA: Diagnosis not present

## 2015-02-04 DIAGNOSIS — E1122 Type 2 diabetes mellitus with diabetic chronic kidney disease: Secondary | ICD-10-CM | POA: Diagnosis not present

## 2015-02-04 DIAGNOSIS — I129 Hypertensive chronic kidney disease with stage 1 through stage 4 chronic kidney disease, or unspecified chronic kidney disease: Secondary | ICD-10-CM | POA: Diagnosis not present

## 2015-02-04 DIAGNOSIS — M5136 Other intervertebral disc degeneration, lumbar region: Secondary | ICD-10-CM | POA: Diagnosis not present

## 2015-02-04 DIAGNOSIS — I2510 Atherosclerotic heart disease of native coronary artery without angina pectoris: Secondary | ICD-10-CM | POA: Diagnosis not present

## 2015-02-04 DIAGNOSIS — N189 Chronic kidney disease, unspecified: Secondary | ICD-10-CM | POA: Diagnosis not present

## 2015-02-04 DIAGNOSIS — Z4889 Encounter for other specified surgical aftercare: Secondary | ICD-10-CM | POA: Diagnosis not present

## 2015-02-11 DIAGNOSIS — Z4889 Encounter for other specified surgical aftercare: Secondary | ICD-10-CM | POA: Diagnosis not present

## 2015-02-11 DIAGNOSIS — N183 Chronic kidney disease, stage 3 (moderate): Secondary | ICD-10-CM | POA: Diagnosis not present

## 2015-02-11 DIAGNOSIS — I2510 Atherosclerotic heart disease of native coronary artery without angina pectoris: Secondary | ICD-10-CM | POA: Diagnosis not present

## 2015-02-11 DIAGNOSIS — I129 Hypertensive chronic kidney disease with stage 1 through stage 4 chronic kidney disease, or unspecified chronic kidney disease: Secondary | ICD-10-CM | POA: Diagnosis not present

## 2015-02-11 DIAGNOSIS — E1122 Type 2 diabetes mellitus with diabetic chronic kidney disease: Secondary | ICD-10-CM | POA: Diagnosis not present

## 2015-02-11 DIAGNOSIS — N189 Chronic kidney disease, unspecified: Secondary | ICD-10-CM | POA: Diagnosis not present

## 2015-02-11 DIAGNOSIS — M5136 Other intervertebral disc degeneration, lumbar region: Secondary | ICD-10-CM | POA: Diagnosis not present

## 2015-03-22 DIAGNOSIS — Z79899 Other long term (current) drug therapy: Secondary | ICD-10-CM | POA: Diagnosis not present

## 2015-03-22 DIAGNOSIS — Z940 Kidney transplant status: Secondary | ICD-10-CM | POA: Diagnosis not present

## 2015-03-29 DIAGNOSIS — Z79899 Other long term (current) drug therapy: Secondary | ICD-10-CM | POA: Diagnosis not present

## 2015-03-29 DIAGNOSIS — Z940 Kidney transplant status: Secondary | ICD-10-CM | POA: Diagnosis not present

## 2015-04-15 DIAGNOSIS — E1151 Type 2 diabetes mellitus with diabetic peripheral angiopathy without gangrene: Secondary | ICD-10-CM | POA: Diagnosis not present

## 2015-04-15 DIAGNOSIS — L84 Corns and callosities: Secondary | ICD-10-CM | POA: Diagnosis not present

## 2015-04-15 DIAGNOSIS — B351 Tinea unguium: Secondary | ICD-10-CM | POA: Diagnosis not present

## 2015-04-19 DIAGNOSIS — I10 Essential (primary) hypertension: Secondary | ICD-10-CM | POA: Diagnosis not present

## 2015-04-19 DIAGNOSIS — E1122 Type 2 diabetes mellitus with diabetic chronic kidney disease: Secondary | ICD-10-CM | POA: Diagnosis not present

## 2015-04-19 DIAGNOSIS — E1143 Type 2 diabetes mellitus with diabetic autonomic (poly)neuropathy: Secondary | ICD-10-CM | POA: Diagnosis not present

## 2015-05-03 DIAGNOSIS — Z79899 Other long term (current) drug therapy: Secondary | ICD-10-CM | POA: Diagnosis not present

## 2015-05-03 DIAGNOSIS — Z940 Kidney transplant status: Secondary | ICD-10-CM | POA: Diagnosis not present

## 2015-05-20 DIAGNOSIS — E119 Type 2 diabetes mellitus without complications: Secondary | ICD-10-CM | POA: Diagnosis not present

## 2015-05-20 DIAGNOSIS — I2510 Atherosclerotic heart disease of native coronary artery without angina pectoris: Secondary | ICD-10-CM | POA: Diagnosis not present

## 2015-05-20 DIAGNOSIS — M47816 Spondylosis without myelopathy or radiculopathy, lumbar region: Secondary | ICD-10-CM | POA: Diagnosis not present

## 2015-05-20 DIAGNOSIS — Z940 Kidney transplant status: Secondary | ICD-10-CM | POA: Diagnosis not present

## 2015-05-20 DIAGNOSIS — I120 Hypertensive chronic kidney disease with stage 5 chronic kidney disease or end stage renal disease: Secondary | ICD-10-CM | POA: Diagnosis not present

## 2015-05-20 DIAGNOSIS — Z880 Allergy status to penicillin: Secondary | ICD-10-CM | POA: Diagnosis not present

## 2015-05-20 DIAGNOSIS — N185 Chronic kidney disease, stage 5: Secondary | ICD-10-CM | POA: Diagnosis not present

## 2015-05-20 DIAGNOSIS — E559 Vitamin D deficiency, unspecified: Secondary | ICD-10-CM | POA: Diagnosis not present

## 2015-05-20 DIAGNOSIS — Z7982 Long term (current) use of aspirin: Secondary | ICD-10-CM | POA: Diagnosis not present

## 2015-05-20 DIAGNOSIS — I129 Hypertensive chronic kidney disease with stage 1 through stage 4 chronic kidney disease, or unspecified chronic kidney disease: Secondary | ICD-10-CM | POA: Diagnosis not present

## 2015-05-20 DIAGNOSIS — Z4822 Encounter for aftercare following kidney transplant: Secondary | ICD-10-CM | POA: Diagnosis not present

## 2015-05-20 DIAGNOSIS — K432 Incisional hernia without obstruction or gangrene: Secondary | ICD-10-CM | POA: Diagnosis not present

## 2015-05-20 DIAGNOSIS — Z79899 Other long term (current) drug therapy: Secondary | ICD-10-CM | POA: Diagnosis not present

## 2015-05-20 DIAGNOSIS — E785 Hyperlipidemia, unspecified: Secondary | ICD-10-CM | POA: Diagnosis not present

## 2015-05-20 DIAGNOSIS — E1122 Type 2 diabetes mellitus with diabetic chronic kidney disease: Secondary | ICD-10-CM | POA: Diagnosis not present

## 2015-05-20 DIAGNOSIS — N186 End stage renal disease: Secondary | ICD-10-CM | POA: Diagnosis not present

## 2015-05-20 DIAGNOSIS — D899 Disorder involving the immune mechanism, unspecified: Secondary | ICD-10-CM | POA: Diagnosis not present

## 2015-05-20 DIAGNOSIS — Z8673 Personal history of transient ischemic attack (TIA), and cerebral infarction without residual deficits: Secondary | ICD-10-CM | POA: Diagnosis not present

## 2015-05-20 DIAGNOSIS — D631 Anemia in chronic kidney disease: Secondary | ICD-10-CM | POA: Diagnosis not present

## 2015-05-20 DIAGNOSIS — M4807 Spinal stenosis, lumbosacral region: Secondary | ICD-10-CM | POA: Diagnosis not present

## 2015-05-20 DIAGNOSIS — N281 Cyst of kidney, acquired: Secondary | ICD-10-CM | POA: Diagnosis not present

## 2015-05-20 DIAGNOSIS — N2581 Secondary hyperparathyroidism of renal origin: Secondary | ICD-10-CM | POA: Diagnosis not present

## 2015-06-09 DIAGNOSIS — Z79899 Other long term (current) drug therapy: Secondary | ICD-10-CM | POA: Diagnosis not present

## 2015-06-09 DIAGNOSIS — N189 Chronic kidney disease, unspecified: Secondary | ICD-10-CM | POA: Diagnosis not present

## 2015-06-09 DIAGNOSIS — Z940 Kidney transplant status: Secondary | ICD-10-CM | POA: Diagnosis not present

## 2015-06-17 DIAGNOSIS — N185 Chronic kidney disease, stage 5: Secondary | ICD-10-CM | POA: Diagnosis not present

## 2015-06-17 DIAGNOSIS — E1129 Type 2 diabetes mellitus with other diabetic kidney complication: Secondary | ICD-10-CM | POA: Diagnosis not present

## 2015-06-17 DIAGNOSIS — Z940 Kidney transplant status: Secondary | ICD-10-CM | POA: Diagnosis not present

## 2015-06-17 DIAGNOSIS — I10 Essential (primary) hypertension: Secondary | ICD-10-CM | POA: Diagnosis not present

## 2015-07-01 DIAGNOSIS — B351 Tinea unguium: Secondary | ICD-10-CM | POA: Diagnosis not present

## 2015-07-01 DIAGNOSIS — L84 Corns and callosities: Secondary | ICD-10-CM | POA: Diagnosis not present

## 2015-07-01 DIAGNOSIS — E1151 Type 2 diabetes mellitus with diabetic peripheral angiopathy without gangrene: Secondary | ICD-10-CM | POA: Diagnosis not present

## 2015-07-13 DIAGNOSIS — D899 Disorder involving the immune mechanism, unspecified: Secondary | ICD-10-CM | POA: Diagnosis not present

## 2015-07-13 DIAGNOSIS — N2581 Secondary hyperparathyroidism of renal origin: Secondary | ICD-10-CM | POA: Diagnosis not present

## 2015-07-13 DIAGNOSIS — Z981 Arthrodesis status: Secondary | ICD-10-CM | POA: Diagnosis not present

## 2015-07-13 DIAGNOSIS — N186 End stage renal disease: Secondary | ICD-10-CM | POA: Diagnosis not present

## 2015-07-13 DIAGNOSIS — N185 Chronic kidney disease, stage 5: Secondary | ICD-10-CM | POA: Diagnosis not present

## 2015-07-13 DIAGNOSIS — K432 Incisional hernia without obstruction or gangrene: Secondary | ICD-10-CM | POA: Diagnosis not present

## 2015-07-13 DIAGNOSIS — I2510 Atherosclerotic heart disease of native coronary artery without angina pectoris: Secondary | ICD-10-CM | POA: Diagnosis not present

## 2015-07-13 DIAGNOSIS — E1122 Type 2 diabetes mellitus with diabetic chronic kidney disease: Secondary | ICD-10-CM | POA: Diagnosis not present

## 2015-07-13 DIAGNOSIS — Z4822 Encounter for aftercare following kidney transplant: Secondary | ICD-10-CM | POA: Diagnosis not present

## 2015-07-13 DIAGNOSIS — E785 Hyperlipidemia, unspecified: Secondary | ICD-10-CM | POA: Diagnosis not present

## 2015-07-13 DIAGNOSIS — I4892 Unspecified atrial flutter: Secondary | ICD-10-CM | POA: Diagnosis not present

## 2015-07-13 DIAGNOSIS — D631 Anemia in chronic kidney disease: Secondary | ICD-10-CM | POA: Diagnosis not present

## 2015-07-13 DIAGNOSIS — M4806 Spinal stenosis, lumbar region: Secondary | ICD-10-CM | POA: Diagnosis not present

## 2015-07-13 DIAGNOSIS — Z940 Kidney transplant status: Secondary | ICD-10-CM | POA: Diagnosis not present

## 2015-07-13 DIAGNOSIS — Z79899 Other long term (current) drug therapy: Secondary | ICD-10-CM | POA: Diagnosis not present

## 2015-07-13 DIAGNOSIS — Z8639 Personal history of other endocrine, nutritional and metabolic disease: Secondary | ICD-10-CM | POA: Diagnosis not present

## 2015-07-13 DIAGNOSIS — N281 Cyst of kidney, acquired: Secondary | ICD-10-CM | POA: Diagnosis not present

## 2015-07-13 DIAGNOSIS — M47896 Other spondylosis, lumbar region: Secondary | ICD-10-CM | POA: Diagnosis not present

## 2015-07-13 DIAGNOSIS — I120 Hypertensive chronic kidney disease with stage 5 chronic kidney disease or end stage renal disease: Secondary | ICD-10-CM | POA: Diagnosis not present

## 2015-07-20 DIAGNOSIS — R110 Nausea: Secondary | ICD-10-CM | POA: Diagnosis not present

## 2015-07-20 DIAGNOSIS — Z940 Kidney transplant status: Secondary | ICD-10-CM | POA: Diagnosis not present

## 2015-07-20 DIAGNOSIS — I10 Essential (primary) hypertension: Secondary | ICD-10-CM | POA: Diagnosis not present

## 2015-07-20 DIAGNOSIS — E1129 Type 2 diabetes mellitus with other diabetic kidney complication: Secondary | ICD-10-CM | POA: Diagnosis not present

## 2015-07-20 DIAGNOSIS — R197 Diarrhea, unspecified: Secondary | ICD-10-CM | POA: Diagnosis not present

## 2015-07-20 DIAGNOSIS — N184 Chronic kidney disease, stage 4 (severe): Secondary | ICD-10-CM | POA: Diagnosis not present

## 2015-07-22 DIAGNOSIS — E1121 Type 2 diabetes mellitus with diabetic nephropathy: Secondary | ICD-10-CM | POA: Diagnosis not present

## 2015-07-22 DIAGNOSIS — I10 Essential (primary) hypertension: Secondary | ICD-10-CM | POA: Diagnosis not present

## 2015-07-22 DIAGNOSIS — Z6823 Body mass index (BMI) 23.0-23.9, adult: Secondary | ICD-10-CM | POA: Diagnosis not present

## 2015-07-22 DIAGNOSIS — Z0000 Encounter for general adult medical examination without abnormal findings: Secondary | ICD-10-CM | POA: Diagnosis not present

## 2015-07-22 DIAGNOSIS — Z1389 Encounter for screening for other disorder: Secondary | ICD-10-CM | POA: Diagnosis not present

## 2015-07-22 DIAGNOSIS — N185 Chronic kidney disease, stage 5: Secondary | ICD-10-CM | POA: Diagnosis not present

## 2015-08-04 DIAGNOSIS — E1121 Type 2 diabetes mellitus with diabetic nephropathy: Secondary | ICD-10-CM | POA: Diagnosis not present

## 2015-08-04 DIAGNOSIS — N185 Chronic kidney disease, stage 5: Secondary | ICD-10-CM | POA: Diagnosis not present

## 2015-08-04 DIAGNOSIS — I10 Essential (primary) hypertension: Secondary | ICD-10-CM | POA: Diagnosis not present

## 2015-08-16 DIAGNOSIS — D509 Iron deficiency anemia, unspecified: Secondary | ICD-10-CM | POA: Diagnosis not present

## 2015-08-16 DIAGNOSIS — Z79899 Other long term (current) drug therapy: Secondary | ICD-10-CM | POA: Diagnosis not present

## 2015-08-16 DIAGNOSIS — R809 Proteinuria, unspecified: Secondary | ICD-10-CM | POA: Diagnosis not present

## 2015-08-16 DIAGNOSIS — E559 Vitamin D deficiency, unspecified: Secondary | ICD-10-CM | POA: Diagnosis not present

## 2015-08-16 DIAGNOSIS — N184 Chronic kidney disease, stage 4 (severe): Secondary | ICD-10-CM | POA: Diagnosis not present

## 2015-08-24 DIAGNOSIS — E1129 Type 2 diabetes mellitus with other diabetic kidney complication: Secondary | ICD-10-CM | POA: Diagnosis not present

## 2015-08-24 DIAGNOSIS — I120 Hypertensive chronic kidney disease with stage 5 chronic kidney disease or end stage renal disease: Secondary | ICD-10-CM | POA: Diagnosis not present

## 2015-08-24 DIAGNOSIS — T8611 Kidney transplant rejection: Secondary | ICD-10-CM | POA: Diagnosis not present

## 2015-08-24 DIAGNOSIS — I10 Essential (primary) hypertension: Secondary | ICD-10-CM | POA: Diagnosis not present

## 2015-08-24 DIAGNOSIS — N185 Chronic kidney disease, stage 5: Secondary | ICD-10-CM | POA: Diagnosis not present

## 2015-08-24 DIAGNOSIS — Z79899 Other long term (current) drug therapy: Secondary | ICD-10-CM | POA: Diagnosis not present

## 2015-08-25 DIAGNOSIS — N185 Chronic kidney disease, stage 5: Secondary | ICD-10-CM | POA: Diagnosis not present

## 2015-08-25 DIAGNOSIS — E1121 Type 2 diabetes mellitus with diabetic nephropathy: Secondary | ICD-10-CM | POA: Diagnosis not present

## 2015-08-25 DIAGNOSIS — I10 Essential (primary) hypertension: Secondary | ICD-10-CM | POA: Diagnosis not present

## 2015-08-30 DIAGNOSIS — Z794 Long term (current) use of insulin: Secondary | ICD-10-CM | POA: Diagnosis not present

## 2015-08-30 DIAGNOSIS — E119 Type 2 diabetes mellitus without complications: Secondary | ICD-10-CM | POA: Diagnosis not present

## 2015-08-30 DIAGNOSIS — E11649 Type 2 diabetes mellitus with hypoglycemia without coma: Secondary | ICD-10-CM | POA: Diagnosis not present

## 2015-08-30 DIAGNOSIS — D509 Iron deficiency anemia, unspecified: Secondary | ICD-10-CM | POA: Diagnosis not present

## 2015-08-30 DIAGNOSIS — E162 Hypoglycemia, unspecified: Secondary | ICD-10-CM | POA: Diagnosis not present

## 2015-08-30 DIAGNOSIS — Z992 Dependence on renal dialysis: Secondary | ICD-10-CM | POA: Diagnosis not present

## 2015-08-30 DIAGNOSIS — N2581 Secondary hyperparathyroidism of renal origin: Secondary | ICD-10-CM | POA: Diagnosis not present

## 2015-08-30 DIAGNOSIS — I259 Chronic ischemic heart disease, unspecified: Secondary | ICD-10-CM | POA: Diagnosis not present

## 2015-08-30 DIAGNOSIS — D631 Anemia in chronic kidney disease: Secondary | ICD-10-CM | POA: Diagnosis not present

## 2015-08-30 DIAGNOSIS — N186 End stage renal disease: Secondary | ICD-10-CM | POA: Diagnosis not present

## 2015-09-01 DIAGNOSIS — D509 Iron deficiency anemia, unspecified: Secondary | ICD-10-CM | POA: Diagnosis not present

## 2015-09-01 DIAGNOSIS — E162 Hypoglycemia, unspecified: Secondary | ICD-10-CM | POA: Diagnosis not present

## 2015-09-01 DIAGNOSIS — D631 Anemia in chronic kidney disease: Secondary | ICD-10-CM | POA: Diagnosis not present

## 2015-09-01 DIAGNOSIS — N2581 Secondary hyperparathyroidism of renal origin: Secondary | ICD-10-CM | POA: Diagnosis not present

## 2015-09-01 DIAGNOSIS — N186 End stage renal disease: Secondary | ICD-10-CM | POA: Diagnosis not present

## 2015-09-01 DIAGNOSIS — Z992 Dependence on renal dialysis: Secondary | ICD-10-CM | POA: Diagnosis not present

## 2015-09-03 DIAGNOSIS — D509 Iron deficiency anemia, unspecified: Secondary | ICD-10-CM | POA: Diagnosis not present

## 2015-09-03 DIAGNOSIS — E162 Hypoglycemia, unspecified: Secondary | ICD-10-CM | POA: Diagnosis not present

## 2015-09-03 DIAGNOSIS — Z992 Dependence on renal dialysis: Secondary | ICD-10-CM | POA: Diagnosis not present

## 2015-09-03 DIAGNOSIS — N2581 Secondary hyperparathyroidism of renal origin: Secondary | ICD-10-CM | POA: Diagnosis not present

## 2015-09-03 DIAGNOSIS — D631 Anemia in chronic kidney disease: Secondary | ICD-10-CM | POA: Diagnosis not present

## 2015-09-03 DIAGNOSIS — N186 End stage renal disease: Secondary | ICD-10-CM | POA: Diagnosis not present

## 2015-09-06 DIAGNOSIS — E162 Hypoglycemia, unspecified: Secondary | ICD-10-CM | POA: Diagnosis not present

## 2015-09-06 DIAGNOSIS — D509 Iron deficiency anemia, unspecified: Secondary | ICD-10-CM | POA: Diagnosis not present

## 2015-09-06 DIAGNOSIS — D631 Anemia in chronic kidney disease: Secondary | ICD-10-CM | POA: Diagnosis not present

## 2015-09-06 DIAGNOSIS — Z992 Dependence on renal dialysis: Secondary | ICD-10-CM | POA: Diagnosis not present

## 2015-09-06 DIAGNOSIS — N2581 Secondary hyperparathyroidism of renal origin: Secondary | ICD-10-CM | POA: Diagnosis not present

## 2015-09-06 DIAGNOSIS — N186 End stage renal disease: Secondary | ICD-10-CM | POA: Diagnosis not present

## 2015-09-08 DIAGNOSIS — E162 Hypoglycemia, unspecified: Secondary | ICD-10-CM | POA: Diagnosis not present

## 2015-09-08 DIAGNOSIS — N2581 Secondary hyperparathyroidism of renal origin: Secondary | ICD-10-CM | POA: Diagnosis not present

## 2015-09-08 DIAGNOSIS — D509 Iron deficiency anemia, unspecified: Secondary | ICD-10-CM | POA: Diagnosis not present

## 2015-09-08 DIAGNOSIS — N186 End stage renal disease: Secondary | ICD-10-CM | POA: Diagnosis not present

## 2015-09-08 DIAGNOSIS — Z992 Dependence on renal dialysis: Secondary | ICD-10-CM | POA: Diagnosis not present

## 2015-09-08 DIAGNOSIS — D631 Anemia in chronic kidney disease: Secondary | ICD-10-CM | POA: Diagnosis not present

## 2015-09-10 DIAGNOSIS — N186 End stage renal disease: Secondary | ICD-10-CM | POA: Diagnosis not present

## 2015-09-10 DIAGNOSIS — Z992 Dependence on renal dialysis: Secondary | ICD-10-CM | POA: Diagnosis not present

## 2015-09-10 DIAGNOSIS — N2581 Secondary hyperparathyroidism of renal origin: Secondary | ICD-10-CM | POA: Diagnosis not present

## 2015-09-10 DIAGNOSIS — D631 Anemia in chronic kidney disease: Secondary | ICD-10-CM | POA: Diagnosis not present

## 2015-09-10 DIAGNOSIS — E162 Hypoglycemia, unspecified: Secondary | ICD-10-CM | POA: Diagnosis not present

## 2015-09-10 DIAGNOSIS — D509 Iron deficiency anemia, unspecified: Secondary | ICD-10-CM | POA: Diagnosis not present

## 2015-09-13 DIAGNOSIS — N186 End stage renal disease: Secondary | ICD-10-CM | POA: Diagnosis not present

## 2015-09-13 DIAGNOSIS — N2581 Secondary hyperparathyroidism of renal origin: Secondary | ICD-10-CM | POA: Diagnosis not present

## 2015-09-13 DIAGNOSIS — D631 Anemia in chronic kidney disease: Secondary | ICD-10-CM | POA: Diagnosis not present

## 2015-09-13 DIAGNOSIS — D509 Iron deficiency anemia, unspecified: Secondary | ICD-10-CM | POA: Diagnosis not present

## 2015-09-13 DIAGNOSIS — E162 Hypoglycemia, unspecified: Secondary | ICD-10-CM | POA: Diagnosis not present

## 2015-09-13 DIAGNOSIS — Z992 Dependence on renal dialysis: Secondary | ICD-10-CM | POA: Diagnosis not present

## 2015-09-15 DIAGNOSIS — N2581 Secondary hyperparathyroidism of renal origin: Secondary | ICD-10-CM | POA: Diagnosis not present

## 2015-09-15 DIAGNOSIS — N186 End stage renal disease: Secondary | ICD-10-CM | POA: Diagnosis not present

## 2015-09-15 DIAGNOSIS — D509 Iron deficiency anemia, unspecified: Secondary | ICD-10-CM | POA: Diagnosis not present

## 2015-09-15 DIAGNOSIS — E162 Hypoglycemia, unspecified: Secondary | ICD-10-CM | POA: Diagnosis not present

## 2015-09-15 DIAGNOSIS — Z992 Dependence on renal dialysis: Secondary | ICD-10-CM | POA: Diagnosis not present

## 2015-09-15 DIAGNOSIS — D631 Anemia in chronic kidney disease: Secondary | ICD-10-CM | POA: Diagnosis not present

## 2015-09-17 DIAGNOSIS — N2581 Secondary hyperparathyroidism of renal origin: Secondary | ICD-10-CM | POA: Diagnosis not present

## 2015-09-17 DIAGNOSIS — Z992 Dependence on renal dialysis: Secondary | ICD-10-CM | POA: Diagnosis not present

## 2015-09-17 DIAGNOSIS — N186 End stage renal disease: Secondary | ICD-10-CM | POA: Diagnosis not present

## 2015-09-17 DIAGNOSIS — D509 Iron deficiency anemia, unspecified: Secondary | ICD-10-CM | POA: Diagnosis not present

## 2015-09-17 DIAGNOSIS — E162 Hypoglycemia, unspecified: Secondary | ICD-10-CM | POA: Diagnosis not present

## 2015-09-17 DIAGNOSIS — D631 Anemia in chronic kidney disease: Secondary | ICD-10-CM | POA: Diagnosis not present

## 2015-09-20 DIAGNOSIS — D631 Anemia in chronic kidney disease: Secondary | ICD-10-CM | POA: Diagnosis not present

## 2015-09-20 DIAGNOSIS — N186 End stage renal disease: Secondary | ICD-10-CM | POA: Diagnosis not present

## 2015-09-20 DIAGNOSIS — N2581 Secondary hyperparathyroidism of renal origin: Secondary | ICD-10-CM | POA: Diagnosis not present

## 2015-09-20 DIAGNOSIS — D509 Iron deficiency anemia, unspecified: Secondary | ICD-10-CM | POA: Diagnosis not present

## 2015-09-20 DIAGNOSIS — E162 Hypoglycemia, unspecified: Secondary | ICD-10-CM | POA: Diagnosis not present

## 2015-09-20 DIAGNOSIS — Z992 Dependence on renal dialysis: Secondary | ICD-10-CM | POA: Diagnosis not present

## 2015-09-22 DIAGNOSIS — D509 Iron deficiency anemia, unspecified: Secondary | ICD-10-CM | POA: Diagnosis not present

## 2015-09-22 DIAGNOSIS — N186 End stage renal disease: Secondary | ICD-10-CM | POA: Diagnosis not present

## 2015-09-22 DIAGNOSIS — D631 Anemia in chronic kidney disease: Secondary | ICD-10-CM | POA: Diagnosis not present

## 2015-09-22 DIAGNOSIS — Z992 Dependence on renal dialysis: Secondary | ICD-10-CM | POA: Diagnosis not present

## 2015-09-22 DIAGNOSIS — E162 Hypoglycemia, unspecified: Secondary | ICD-10-CM | POA: Diagnosis not present

## 2015-09-22 DIAGNOSIS — N2581 Secondary hyperparathyroidism of renal origin: Secondary | ICD-10-CM | POA: Diagnosis not present

## 2015-09-23 DIAGNOSIS — N186 End stage renal disease: Secondary | ICD-10-CM | POA: Diagnosis not present

## 2015-09-23 DIAGNOSIS — Z992 Dependence on renal dialysis: Secondary | ICD-10-CM | POA: Diagnosis not present

## 2015-09-24 DIAGNOSIS — N186 End stage renal disease: Secondary | ICD-10-CM | POA: Diagnosis not present

## 2015-09-24 DIAGNOSIS — Z992 Dependence on renal dialysis: Secondary | ICD-10-CM | POA: Diagnosis not present

## 2015-09-24 DIAGNOSIS — E11649 Type 2 diabetes mellitus with hypoglycemia without coma: Secondary | ICD-10-CM | POA: Diagnosis not present

## 2015-09-24 DIAGNOSIS — N2581 Secondary hyperparathyroidism of renal origin: Secondary | ICD-10-CM | POA: Diagnosis not present

## 2015-09-24 DIAGNOSIS — E162 Hypoglycemia, unspecified: Secondary | ICD-10-CM | POA: Diagnosis not present

## 2015-09-24 DIAGNOSIS — Z23 Encounter for immunization: Secondary | ICD-10-CM | POA: Diagnosis not present

## 2015-09-24 DIAGNOSIS — D509 Iron deficiency anemia, unspecified: Secondary | ICD-10-CM | POA: Diagnosis not present

## 2015-09-27 DIAGNOSIS — D509 Iron deficiency anemia, unspecified: Secondary | ICD-10-CM | POA: Diagnosis not present

## 2015-09-27 DIAGNOSIS — Z23 Encounter for immunization: Secondary | ICD-10-CM | POA: Diagnosis not present

## 2015-09-27 DIAGNOSIS — E162 Hypoglycemia, unspecified: Secondary | ICD-10-CM | POA: Diagnosis not present

## 2015-09-27 DIAGNOSIS — Z992 Dependence on renal dialysis: Secondary | ICD-10-CM | POA: Diagnosis not present

## 2015-09-27 DIAGNOSIS — N186 End stage renal disease: Secondary | ICD-10-CM | POA: Diagnosis not present

## 2015-09-27 DIAGNOSIS — N2581 Secondary hyperparathyroidism of renal origin: Secondary | ICD-10-CM | POA: Diagnosis not present

## 2015-09-29 DIAGNOSIS — N2581 Secondary hyperparathyroidism of renal origin: Secondary | ICD-10-CM | POA: Diagnosis not present

## 2015-09-29 DIAGNOSIS — Z992 Dependence on renal dialysis: Secondary | ICD-10-CM | POA: Diagnosis not present

## 2015-09-29 DIAGNOSIS — N186 End stage renal disease: Secondary | ICD-10-CM | POA: Diagnosis not present

## 2015-09-29 DIAGNOSIS — E162 Hypoglycemia, unspecified: Secondary | ICD-10-CM | POA: Diagnosis not present

## 2015-09-29 DIAGNOSIS — D509 Iron deficiency anemia, unspecified: Secondary | ICD-10-CM | POA: Diagnosis not present

## 2015-09-29 DIAGNOSIS — Z23 Encounter for immunization: Secondary | ICD-10-CM | POA: Diagnosis not present

## 2015-09-30 DIAGNOSIS — E1121 Type 2 diabetes mellitus with diabetic nephropathy: Secondary | ICD-10-CM | POA: Diagnosis not present

## 2015-09-30 DIAGNOSIS — I10 Essential (primary) hypertension: Secondary | ICD-10-CM | POA: Diagnosis not present

## 2015-09-30 DIAGNOSIS — N185 Chronic kidney disease, stage 5: Secondary | ICD-10-CM | POA: Diagnosis not present

## 2015-10-01 DIAGNOSIS — Z23 Encounter for immunization: Secondary | ICD-10-CM | POA: Diagnosis not present

## 2015-10-01 DIAGNOSIS — N186 End stage renal disease: Secondary | ICD-10-CM | POA: Diagnosis not present

## 2015-10-01 DIAGNOSIS — Z992 Dependence on renal dialysis: Secondary | ICD-10-CM | POA: Diagnosis not present

## 2015-10-01 DIAGNOSIS — N2581 Secondary hyperparathyroidism of renal origin: Secondary | ICD-10-CM | POA: Diagnosis not present

## 2015-10-01 DIAGNOSIS — D509 Iron deficiency anemia, unspecified: Secondary | ICD-10-CM | POA: Diagnosis not present

## 2015-10-01 DIAGNOSIS — E162 Hypoglycemia, unspecified: Secondary | ICD-10-CM | POA: Diagnosis not present

## 2015-10-03 DIAGNOSIS — Z23 Encounter for immunization: Secondary | ICD-10-CM | POA: Diagnosis not present

## 2015-10-03 DIAGNOSIS — Z992 Dependence on renal dialysis: Secondary | ICD-10-CM | POA: Diagnosis not present

## 2015-10-03 DIAGNOSIS — E162 Hypoglycemia, unspecified: Secondary | ICD-10-CM | POA: Diagnosis not present

## 2015-10-03 DIAGNOSIS — N186 End stage renal disease: Secondary | ICD-10-CM | POA: Diagnosis not present

## 2015-10-03 DIAGNOSIS — D509 Iron deficiency anemia, unspecified: Secondary | ICD-10-CM | POA: Diagnosis not present

## 2015-10-03 DIAGNOSIS — N2581 Secondary hyperparathyroidism of renal origin: Secondary | ICD-10-CM | POA: Diagnosis not present

## 2015-10-06 DIAGNOSIS — E162 Hypoglycemia, unspecified: Secondary | ICD-10-CM | POA: Diagnosis not present

## 2015-10-06 DIAGNOSIS — N2581 Secondary hyperparathyroidism of renal origin: Secondary | ICD-10-CM | POA: Diagnosis not present

## 2015-10-06 DIAGNOSIS — Z23 Encounter for immunization: Secondary | ICD-10-CM | POA: Diagnosis not present

## 2015-10-06 DIAGNOSIS — N186 End stage renal disease: Secondary | ICD-10-CM | POA: Diagnosis not present

## 2015-10-06 DIAGNOSIS — D509 Iron deficiency anemia, unspecified: Secondary | ICD-10-CM | POA: Diagnosis not present

## 2015-10-06 DIAGNOSIS — Z992 Dependence on renal dialysis: Secondary | ICD-10-CM | POA: Diagnosis not present

## 2015-10-08 DIAGNOSIS — N2581 Secondary hyperparathyroidism of renal origin: Secondary | ICD-10-CM | POA: Diagnosis not present

## 2015-10-08 DIAGNOSIS — E162 Hypoglycemia, unspecified: Secondary | ICD-10-CM | POA: Diagnosis not present

## 2015-10-08 DIAGNOSIS — N186 End stage renal disease: Secondary | ICD-10-CM | POA: Diagnosis not present

## 2015-10-08 DIAGNOSIS — D509 Iron deficiency anemia, unspecified: Secondary | ICD-10-CM | POA: Diagnosis not present

## 2015-10-08 DIAGNOSIS — Z992 Dependence on renal dialysis: Secondary | ICD-10-CM | POA: Diagnosis not present

## 2015-10-08 DIAGNOSIS — Z23 Encounter for immunization: Secondary | ICD-10-CM | POA: Diagnosis not present

## 2015-10-11 DIAGNOSIS — E162 Hypoglycemia, unspecified: Secondary | ICD-10-CM | POA: Diagnosis not present

## 2015-10-11 DIAGNOSIS — N186 End stage renal disease: Secondary | ICD-10-CM | POA: Diagnosis not present

## 2015-10-11 DIAGNOSIS — Z992 Dependence on renal dialysis: Secondary | ICD-10-CM | POA: Diagnosis not present

## 2015-10-11 DIAGNOSIS — Z23 Encounter for immunization: Secondary | ICD-10-CM | POA: Diagnosis not present

## 2015-10-11 DIAGNOSIS — D509 Iron deficiency anemia, unspecified: Secondary | ICD-10-CM | POA: Diagnosis not present

## 2015-10-11 DIAGNOSIS — N2581 Secondary hyperparathyroidism of renal origin: Secondary | ICD-10-CM | POA: Diagnosis not present

## 2015-10-12 DIAGNOSIS — E1151 Type 2 diabetes mellitus with diabetic peripheral angiopathy without gangrene: Secondary | ICD-10-CM | POA: Diagnosis not present

## 2015-10-12 DIAGNOSIS — L84 Corns and callosities: Secondary | ICD-10-CM | POA: Diagnosis not present

## 2015-10-12 DIAGNOSIS — B351 Tinea unguium: Secondary | ICD-10-CM | POA: Diagnosis not present

## 2015-10-13 DIAGNOSIS — Z992 Dependence on renal dialysis: Secondary | ICD-10-CM | POA: Diagnosis not present

## 2015-10-13 DIAGNOSIS — E162 Hypoglycemia, unspecified: Secondary | ICD-10-CM | POA: Diagnosis not present

## 2015-10-13 DIAGNOSIS — Z23 Encounter for immunization: Secondary | ICD-10-CM | POA: Diagnosis not present

## 2015-10-13 DIAGNOSIS — N186 End stage renal disease: Secondary | ICD-10-CM | POA: Diagnosis not present

## 2015-10-13 DIAGNOSIS — D509 Iron deficiency anemia, unspecified: Secondary | ICD-10-CM | POA: Diagnosis not present

## 2015-10-13 DIAGNOSIS — N2581 Secondary hyperparathyroidism of renal origin: Secondary | ICD-10-CM | POA: Diagnosis not present

## 2015-10-15 DIAGNOSIS — N2581 Secondary hyperparathyroidism of renal origin: Secondary | ICD-10-CM | POA: Diagnosis not present

## 2015-10-15 DIAGNOSIS — Z992 Dependence on renal dialysis: Secondary | ICD-10-CM | POA: Diagnosis not present

## 2015-10-15 DIAGNOSIS — E162 Hypoglycemia, unspecified: Secondary | ICD-10-CM | POA: Diagnosis not present

## 2015-10-15 DIAGNOSIS — Z23 Encounter for immunization: Secondary | ICD-10-CM | POA: Diagnosis not present

## 2015-10-15 DIAGNOSIS — N186 End stage renal disease: Secondary | ICD-10-CM | POA: Diagnosis not present

## 2015-10-15 DIAGNOSIS — D509 Iron deficiency anemia, unspecified: Secondary | ICD-10-CM | POA: Diagnosis not present

## 2015-10-18 DIAGNOSIS — D509 Iron deficiency anemia, unspecified: Secondary | ICD-10-CM | POA: Diagnosis not present

## 2015-10-18 DIAGNOSIS — E162 Hypoglycemia, unspecified: Secondary | ICD-10-CM | POA: Diagnosis not present

## 2015-10-18 DIAGNOSIS — Z23 Encounter for immunization: Secondary | ICD-10-CM | POA: Diagnosis not present

## 2015-10-18 DIAGNOSIS — N186 End stage renal disease: Secondary | ICD-10-CM | POA: Diagnosis not present

## 2015-10-18 DIAGNOSIS — Z992 Dependence on renal dialysis: Secondary | ICD-10-CM | POA: Diagnosis not present

## 2015-10-18 DIAGNOSIS — N2581 Secondary hyperparathyroidism of renal origin: Secondary | ICD-10-CM | POA: Diagnosis not present

## 2015-10-20 DIAGNOSIS — Z23 Encounter for immunization: Secondary | ICD-10-CM | POA: Diagnosis not present

## 2015-10-20 DIAGNOSIS — D509 Iron deficiency anemia, unspecified: Secondary | ICD-10-CM | POA: Diagnosis not present

## 2015-10-20 DIAGNOSIS — N186 End stage renal disease: Secondary | ICD-10-CM | POA: Diagnosis not present

## 2015-10-20 DIAGNOSIS — N2581 Secondary hyperparathyroidism of renal origin: Secondary | ICD-10-CM | POA: Diagnosis not present

## 2015-10-20 DIAGNOSIS — E162 Hypoglycemia, unspecified: Secondary | ICD-10-CM | POA: Diagnosis not present

## 2015-10-20 DIAGNOSIS — Z992 Dependence on renal dialysis: Secondary | ICD-10-CM | POA: Diagnosis not present

## 2015-10-22 DIAGNOSIS — Z992 Dependence on renal dialysis: Secondary | ICD-10-CM | POA: Diagnosis not present

## 2015-10-22 DIAGNOSIS — N186 End stage renal disease: Secondary | ICD-10-CM | POA: Diagnosis not present

## 2015-10-22 DIAGNOSIS — N2581 Secondary hyperparathyroidism of renal origin: Secondary | ICD-10-CM | POA: Diagnosis not present

## 2015-10-22 DIAGNOSIS — D509 Iron deficiency anemia, unspecified: Secondary | ICD-10-CM | POA: Diagnosis not present

## 2015-10-22 DIAGNOSIS — Z23 Encounter for immunization: Secondary | ICD-10-CM | POA: Diagnosis not present

## 2015-10-22 DIAGNOSIS — E162 Hypoglycemia, unspecified: Secondary | ICD-10-CM | POA: Diagnosis not present

## 2015-10-23 DIAGNOSIS — N186 End stage renal disease: Secondary | ICD-10-CM | POA: Diagnosis not present

## 2015-10-23 DIAGNOSIS — Z992 Dependence on renal dialysis: Secondary | ICD-10-CM | POA: Diagnosis not present

## 2015-10-25 DIAGNOSIS — Z992 Dependence on renal dialysis: Secondary | ICD-10-CM | POA: Diagnosis not present

## 2015-10-25 DIAGNOSIS — Z23 Encounter for immunization: Secondary | ICD-10-CM | POA: Diagnosis not present

## 2015-10-25 DIAGNOSIS — D509 Iron deficiency anemia, unspecified: Secondary | ICD-10-CM | POA: Diagnosis not present

## 2015-10-25 DIAGNOSIS — N2581 Secondary hyperparathyroidism of renal origin: Secondary | ICD-10-CM | POA: Diagnosis not present

## 2015-10-25 DIAGNOSIS — N186 End stage renal disease: Secondary | ICD-10-CM | POA: Diagnosis not present

## 2015-10-26 DIAGNOSIS — I10 Essential (primary) hypertension: Secondary | ICD-10-CM | POA: Diagnosis not present

## 2015-10-26 DIAGNOSIS — E1121 Type 2 diabetes mellitus with diabetic nephropathy: Secondary | ICD-10-CM | POA: Diagnosis not present

## 2015-10-26 DIAGNOSIS — N185 Chronic kidney disease, stage 5: Secondary | ICD-10-CM | POA: Diagnosis not present

## 2015-10-26 DIAGNOSIS — Z6822 Body mass index (BMI) 22.0-22.9, adult: Secondary | ICD-10-CM | POA: Diagnosis not present

## 2015-10-27 DIAGNOSIS — Z23 Encounter for immunization: Secondary | ICD-10-CM | POA: Diagnosis not present

## 2015-10-27 DIAGNOSIS — N186 End stage renal disease: Secondary | ICD-10-CM | POA: Diagnosis not present

## 2015-10-27 DIAGNOSIS — D509 Iron deficiency anemia, unspecified: Secondary | ICD-10-CM | POA: Diagnosis not present

## 2015-10-27 DIAGNOSIS — N2581 Secondary hyperparathyroidism of renal origin: Secondary | ICD-10-CM | POA: Diagnosis not present

## 2015-10-27 DIAGNOSIS — Z992 Dependence on renal dialysis: Secondary | ICD-10-CM | POA: Diagnosis not present

## 2015-10-29 DIAGNOSIS — N2581 Secondary hyperparathyroidism of renal origin: Secondary | ICD-10-CM | POA: Diagnosis not present

## 2015-10-29 DIAGNOSIS — D509 Iron deficiency anemia, unspecified: Secondary | ICD-10-CM | POA: Diagnosis not present

## 2015-10-29 DIAGNOSIS — N186 End stage renal disease: Secondary | ICD-10-CM | POA: Diagnosis not present

## 2015-10-29 DIAGNOSIS — Z23 Encounter for immunization: Secondary | ICD-10-CM | POA: Diagnosis not present

## 2015-10-29 DIAGNOSIS — Z992 Dependence on renal dialysis: Secondary | ICD-10-CM | POA: Diagnosis not present

## 2015-11-01 DIAGNOSIS — Z992 Dependence on renal dialysis: Secondary | ICD-10-CM | POA: Diagnosis not present

## 2015-11-01 DIAGNOSIS — N2581 Secondary hyperparathyroidism of renal origin: Secondary | ICD-10-CM | POA: Diagnosis not present

## 2015-11-01 DIAGNOSIS — Z23 Encounter for immunization: Secondary | ICD-10-CM | POA: Diagnosis not present

## 2015-11-01 DIAGNOSIS — D509 Iron deficiency anemia, unspecified: Secondary | ICD-10-CM | POA: Diagnosis not present

## 2015-11-01 DIAGNOSIS — N186 End stage renal disease: Secondary | ICD-10-CM | POA: Diagnosis not present

## 2015-11-03 DIAGNOSIS — N186 End stage renal disease: Secondary | ICD-10-CM | POA: Diagnosis not present

## 2015-11-03 DIAGNOSIS — N2581 Secondary hyperparathyroidism of renal origin: Secondary | ICD-10-CM | POA: Diagnosis not present

## 2015-11-03 DIAGNOSIS — Z992 Dependence on renal dialysis: Secondary | ICD-10-CM | POA: Diagnosis not present

## 2015-11-03 DIAGNOSIS — Z23 Encounter for immunization: Secondary | ICD-10-CM | POA: Diagnosis not present

## 2015-11-03 DIAGNOSIS — D509 Iron deficiency anemia, unspecified: Secondary | ICD-10-CM | POA: Diagnosis not present

## 2015-11-05 DIAGNOSIS — N186 End stage renal disease: Secondary | ICD-10-CM | POA: Diagnosis not present

## 2015-11-05 DIAGNOSIS — D509 Iron deficiency anemia, unspecified: Secondary | ICD-10-CM | POA: Diagnosis not present

## 2015-11-05 DIAGNOSIS — N2581 Secondary hyperparathyroidism of renal origin: Secondary | ICD-10-CM | POA: Diagnosis not present

## 2015-11-05 DIAGNOSIS — Z23 Encounter for immunization: Secondary | ICD-10-CM | POA: Diagnosis not present

## 2015-11-05 DIAGNOSIS — Z992 Dependence on renal dialysis: Secondary | ICD-10-CM | POA: Diagnosis not present

## 2015-11-08 DIAGNOSIS — Z23 Encounter for immunization: Secondary | ICD-10-CM | POA: Diagnosis not present

## 2015-11-08 DIAGNOSIS — D509 Iron deficiency anemia, unspecified: Secondary | ICD-10-CM | POA: Diagnosis not present

## 2015-11-08 DIAGNOSIS — Z992 Dependence on renal dialysis: Secondary | ICD-10-CM | POA: Diagnosis not present

## 2015-11-08 DIAGNOSIS — N186 End stage renal disease: Secondary | ICD-10-CM | POA: Diagnosis not present

## 2015-11-08 DIAGNOSIS — N2581 Secondary hyperparathyroidism of renal origin: Secondary | ICD-10-CM | POA: Diagnosis not present

## 2015-11-10 DIAGNOSIS — D509 Iron deficiency anemia, unspecified: Secondary | ICD-10-CM | POA: Diagnosis not present

## 2015-11-10 DIAGNOSIS — N186 End stage renal disease: Secondary | ICD-10-CM | POA: Diagnosis not present

## 2015-11-10 DIAGNOSIS — Z992 Dependence on renal dialysis: Secondary | ICD-10-CM | POA: Diagnosis not present

## 2015-11-10 DIAGNOSIS — Z23 Encounter for immunization: Secondary | ICD-10-CM | POA: Diagnosis not present

## 2015-11-10 DIAGNOSIS — N2581 Secondary hyperparathyroidism of renal origin: Secondary | ICD-10-CM | POA: Diagnosis not present

## 2015-11-12 DIAGNOSIS — N2581 Secondary hyperparathyroidism of renal origin: Secondary | ICD-10-CM | POA: Diagnosis not present

## 2015-11-12 DIAGNOSIS — N186 End stage renal disease: Secondary | ICD-10-CM | POA: Diagnosis not present

## 2015-11-12 DIAGNOSIS — Z992 Dependence on renal dialysis: Secondary | ICD-10-CM | POA: Diagnosis not present

## 2015-11-12 DIAGNOSIS — D509 Iron deficiency anemia, unspecified: Secondary | ICD-10-CM | POA: Diagnosis not present

## 2015-11-12 DIAGNOSIS — Z23 Encounter for immunization: Secondary | ICD-10-CM | POA: Diagnosis not present

## 2015-11-15 DIAGNOSIS — N2581 Secondary hyperparathyroidism of renal origin: Secondary | ICD-10-CM | POA: Diagnosis not present

## 2015-11-15 DIAGNOSIS — N186 End stage renal disease: Secondary | ICD-10-CM | POA: Diagnosis not present

## 2015-11-15 DIAGNOSIS — Z23 Encounter for immunization: Secondary | ICD-10-CM | POA: Diagnosis not present

## 2015-11-15 DIAGNOSIS — Z992 Dependence on renal dialysis: Secondary | ICD-10-CM | POA: Diagnosis not present

## 2015-11-15 DIAGNOSIS — D509 Iron deficiency anemia, unspecified: Secondary | ICD-10-CM | POA: Diagnosis not present

## 2015-11-17 DIAGNOSIS — Z992 Dependence on renal dialysis: Secondary | ICD-10-CM | POA: Diagnosis not present

## 2015-11-17 DIAGNOSIS — D509 Iron deficiency anemia, unspecified: Secondary | ICD-10-CM | POA: Diagnosis not present

## 2015-11-17 DIAGNOSIS — N186 End stage renal disease: Secondary | ICD-10-CM | POA: Diagnosis not present

## 2015-11-17 DIAGNOSIS — N2581 Secondary hyperparathyroidism of renal origin: Secondary | ICD-10-CM | POA: Diagnosis not present

## 2015-11-17 DIAGNOSIS — Z23 Encounter for immunization: Secondary | ICD-10-CM | POA: Diagnosis not present

## 2015-11-19 DIAGNOSIS — N2581 Secondary hyperparathyroidism of renal origin: Secondary | ICD-10-CM | POA: Diagnosis not present

## 2015-11-19 DIAGNOSIS — Z992 Dependence on renal dialysis: Secondary | ICD-10-CM | POA: Diagnosis not present

## 2015-11-19 DIAGNOSIS — N186 End stage renal disease: Secondary | ICD-10-CM | POA: Diagnosis not present

## 2015-11-19 DIAGNOSIS — D509 Iron deficiency anemia, unspecified: Secondary | ICD-10-CM | POA: Diagnosis not present

## 2015-11-19 DIAGNOSIS — Z23 Encounter for immunization: Secondary | ICD-10-CM | POA: Diagnosis not present

## 2015-11-22 DIAGNOSIS — Z23 Encounter for immunization: Secondary | ICD-10-CM | POA: Diagnosis not present

## 2015-11-22 DIAGNOSIS — N186 End stage renal disease: Secondary | ICD-10-CM | POA: Diagnosis not present

## 2015-11-22 DIAGNOSIS — Z992 Dependence on renal dialysis: Secondary | ICD-10-CM | POA: Diagnosis not present

## 2015-11-22 DIAGNOSIS — N2581 Secondary hyperparathyroidism of renal origin: Secondary | ICD-10-CM | POA: Diagnosis not present

## 2015-11-22 DIAGNOSIS — D509 Iron deficiency anemia, unspecified: Secondary | ICD-10-CM | POA: Diagnosis not present

## 2015-11-23 DIAGNOSIS — Z992 Dependence on renal dialysis: Secondary | ICD-10-CM | POA: Diagnosis not present

## 2015-11-23 DIAGNOSIS — N186 End stage renal disease: Secondary | ICD-10-CM | POA: Diagnosis not present

## 2015-11-24 DIAGNOSIS — D509 Iron deficiency anemia, unspecified: Secondary | ICD-10-CM | POA: Diagnosis not present

## 2015-11-24 DIAGNOSIS — Z992 Dependence on renal dialysis: Secondary | ICD-10-CM | POA: Diagnosis not present

## 2015-11-24 DIAGNOSIS — N186 End stage renal disease: Secondary | ICD-10-CM | POA: Diagnosis not present

## 2015-11-24 DIAGNOSIS — Z23 Encounter for immunization: Secondary | ICD-10-CM | POA: Diagnosis not present

## 2015-11-24 DIAGNOSIS — E11649 Type 2 diabetes mellitus with hypoglycemia without coma: Secondary | ICD-10-CM | POA: Diagnosis not present

## 2015-11-24 DIAGNOSIS — N2581 Secondary hyperparathyroidism of renal origin: Secondary | ICD-10-CM | POA: Diagnosis not present

## 2015-11-24 DIAGNOSIS — E162 Hypoglycemia, unspecified: Secondary | ICD-10-CM | POA: Diagnosis not present

## 2015-11-24 DIAGNOSIS — D631 Anemia in chronic kidney disease: Secondary | ICD-10-CM | POA: Diagnosis not present

## 2015-11-26 DIAGNOSIS — D509 Iron deficiency anemia, unspecified: Secondary | ICD-10-CM | POA: Diagnosis not present

## 2015-11-26 DIAGNOSIS — N185 Chronic kidney disease, stage 5: Secondary | ICD-10-CM | POA: Diagnosis not present

## 2015-11-26 DIAGNOSIS — N2581 Secondary hyperparathyroidism of renal origin: Secondary | ICD-10-CM | POA: Diagnosis not present

## 2015-11-26 DIAGNOSIS — D631 Anemia in chronic kidney disease: Secondary | ICD-10-CM | POA: Diagnosis not present

## 2015-11-26 DIAGNOSIS — I10 Essential (primary) hypertension: Secondary | ICD-10-CM | POA: Diagnosis not present

## 2015-11-26 DIAGNOSIS — Z23 Encounter for immunization: Secondary | ICD-10-CM | POA: Diagnosis not present

## 2015-11-26 DIAGNOSIS — M545 Low back pain: Secondary | ICD-10-CM | POA: Diagnosis not present

## 2015-11-26 DIAGNOSIS — N186 End stage renal disease: Secondary | ICD-10-CM | POA: Diagnosis not present

## 2015-11-26 DIAGNOSIS — E162 Hypoglycemia, unspecified: Secondary | ICD-10-CM | POA: Diagnosis not present

## 2015-11-26 DIAGNOSIS — E119 Type 2 diabetes mellitus without complications: Secondary | ICD-10-CM | POA: Diagnosis not present

## 2015-11-29 DIAGNOSIS — E162 Hypoglycemia, unspecified: Secondary | ICD-10-CM | POA: Diagnosis not present

## 2015-11-29 DIAGNOSIS — D631 Anemia in chronic kidney disease: Secondary | ICD-10-CM | POA: Diagnosis not present

## 2015-11-29 DIAGNOSIS — N186 End stage renal disease: Secondary | ICD-10-CM | POA: Diagnosis not present

## 2015-11-29 DIAGNOSIS — N2581 Secondary hyperparathyroidism of renal origin: Secondary | ICD-10-CM | POA: Diagnosis not present

## 2015-11-29 DIAGNOSIS — D509 Iron deficiency anemia, unspecified: Secondary | ICD-10-CM | POA: Diagnosis not present

## 2015-11-29 DIAGNOSIS — Z23 Encounter for immunization: Secondary | ICD-10-CM | POA: Diagnosis not present

## 2015-12-01 DIAGNOSIS — N186 End stage renal disease: Secondary | ICD-10-CM | POA: Diagnosis not present

## 2015-12-01 DIAGNOSIS — Z23 Encounter for immunization: Secondary | ICD-10-CM | POA: Diagnosis not present

## 2015-12-01 DIAGNOSIS — N2581 Secondary hyperparathyroidism of renal origin: Secondary | ICD-10-CM | POA: Diagnosis not present

## 2015-12-01 DIAGNOSIS — D509 Iron deficiency anemia, unspecified: Secondary | ICD-10-CM | POA: Diagnosis not present

## 2015-12-01 DIAGNOSIS — D631 Anemia in chronic kidney disease: Secondary | ICD-10-CM | POA: Diagnosis not present

## 2015-12-01 DIAGNOSIS — E162 Hypoglycemia, unspecified: Secondary | ICD-10-CM | POA: Diagnosis not present

## 2015-12-03 DIAGNOSIS — N2581 Secondary hyperparathyroidism of renal origin: Secondary | ICD-10-CM | POA: Diagnosis not present

## 2015-12-03 DIAGNOSIS — N186 End stage renal disease: Secondary | ICD-10-CM | POA: Diagnosis not present

## 2015-12-03 DIAGNOSIS — Z23 Encounter for immunization: Secondary | ICD-10-CM | POA: Diagnosis not present

## 2015-12-03 DIAGNOSIS — D509 Iron deficiency anemia, unspecified: Secondary | ICD-10-CM | POA: Diagnosis not present

## 2015-12-03 DIAGNOSIS — D631 Anemia in chronic kidney disease: Secondary | ICD-10-CM | POA: Diagnosis not present

## 2015-12-03 DIAGNOSIS — E162 Hypoglycemia, unspecified: Secondary | ICD-10-CM | POA: Diagnosis not present

## 2015-12-04 DIAGNOSIS — Z992 Dependence on renal dialysis: Secondary | ICD-10-CM | POA: Diagnosis not present

## 2015-12-04 DIAGNOSIS — N186 End stage renal disease: Secondary | ICD-10-CM | POA: Diagnosis not present

## 2015-12-04 DIAGNOSIS — E8770 Fluid overload, unspecified: Secondary | ICD-10-CM | POA: Diagnosis not present

## 2015-12-06 DIAGNOSIS — N2581 Secondary hyperparathyroidism of renal origin: Secondary | ICD-10-CM | POA: Diagnosis not present

## 2015-12-06 DIAGNOSIS — D509 Iron deficiency anemia, unspecified: Secondary | ICD-10-CM | POA: Diagnosis not present

## 2015-12-06 DIAGNOSIS — N186 End stage renal disease: Secondary | ICD-10-CM | POA: Diagnosis not present

## 2015-12-06 DIAGNOSIS — D631 Anemia in chronic kidney disease: Secondary | ICD-10-CM | POA: Diagnosis not present

## 2015-12-06 DIAGNOSIS — E162 Hypoglycemia, unspecified: Secondary | ICD-10-CM | POA: Diagnosis not present

## 2015-12-06 DIAGNOSIS — Z23 Encounter for immunization: Secondary | ICD-10-CM | POA: Diagnosis not present

## 2015-12-08 DIAGNOSIS — D631 Anemia in chronic kidney disease: Secondary | ICD-10-CM | POA: Diagnosis not present

## 2015-12-08 DIAGNOSIS — N2581 Secondary hyperparathyroidism of renal origin: Secondary | ICD-10-CM | POA: Diagnosis not present

## 2015-12-08 DIAGNOSIS — D509 Iron deficiency anemia, unspecified: Secondary | ICD-10-CM | POA: Diagnosis not present

## 2015-12-08 DIAGNOSIS — E162 Hypoglycemia, unspecified: Secondary | ICD-10-CM | POA: Diagnosis not present

## 2015-12-08 DIAGNOSIS — N186 End stage renal disease: Secondary | ICD-10-CM | POA: Diagnosis not present

## 2015-12-08 DIAGNOSIS — Z23 Encounter for immunization: Secondary | ICD-10-CM | POA: Diagnosis not present

## 2015-12-10 DIAGNOSIS — E162 Hypoglycemia, unspecified: Secondary | ICD-10-CM | POA: Diagnosis not present

## 2015-12-10 DIAGNOSIS — Z23 Encounter for immunization: Secondary | ICD-10-CM | POA: Diagnosis not present

## 2015-12-10 DIAGNOSIS — D631 Anemia in chronic kidney disease: Secondary | ICD-10-CM | POA: Diagnosis not present

## 2015-12-10 DIAGNOSIS — N186 End stage renal disease: Secondary | ICD-10-CM | POA: Diagnosis not present

## 2015-12-10 DIAGNOSIS — N2581 Secondary hyperparathyroidism of renal origin: Secondary | ICD-10-CM | POA: Diagnosis not present

## 2015-12-10 DIAGNOSIS — D509 Iron deficiency anemia, unspecified: Secondary | ICD-10-CM | POA: Diagnosis not present

## 2015-12-13 DIAGNOSIS — Z992 Dependence on renal dialysis: Secondary | ICD-10-CM | POA: Diagnosis not present

## 2015-12-13 DIAGNOSIS — N2581 Secondary hyperparathyroidism of renal origin: Secondary | ICD-10-CM | POA: Diagnosis not present

## 2015-12-13 DIAGNOSIS — D509 Iron deficiency anemia, unspecified: Secondary | ICD-10-CM | POA: Diagnosis not present

## 2015-12-13 DIAGNOSIS — N186 End stage renal disease: Secondary | ICD-10-CM | POA: Diagnosis not present

## 2015-12-13 DIAGNOSIS — E162 Hypoglycemia, unspecified: Secondary | ICD-10-CM | POA: Diagnosis not present

## 2015-12-13 DIAGNOSIS — D631 Anemia in chronic kidney disease: Secondary | ICD-10-CM | POA: Diagnosis not present

## 2015-12-13 DIAGNOSIS — Z23 Encounter for immunization: Secondary | ICD-10-CM | POA: Diagnosis not present

## 2015-12-15 DIAGNOSIS — Z23 Encounter for immunization: Secondary | ICD-10-CM | POA: Diagnosis not present

## 2015-12-15 DIAGNOSIS — D509 Iron deficiency anemia, unspecified: Secondary | ICD-10-CM | POA: Diagnosis not present

## 2015-12-15 DIAGNOSIS — E162 Hypoglycemia, unspecified: Secondary | ICD-10-CM | POA: Diagnosis not present

## 2015-12-15 DIAGNOSIS — N186 End stage renal disease: Secondary | ICD-10-CM | POA: Diagnosis not present

## 2015-12-15 DIAGNOSIS — N2581 Secondary hyperparathyroidism of renal origin: Secondary | ICD-10-CM | POA: Diagnosis not present

## 2015-12-15 DIAGNOSIS — D631 Anemia in chronic kidney disease: Secondary | ICD-10-CM | POA: Diagnosis not present

## 2015-12-17 DIAGNOSIS — D631 Anemia in chronic kidney disease: Secondary | ICD-10-CM | POA: Diagnosis not present

## 2015-12-17 DIAGNOSIS — Z23 Encounter for immunization: Secondary | ICD-10-CM | POA: Diagnosis not present

## 2015-12-17 DIAGNOSIS — N2581 Secondary hyperparathyroidism of renal origin: Secondary | ICD-10-CM | POA: Diagnosis not present

## 2015-12-17 DIAGNOSIS — D509 Iron deficiency anemia, unspecified: Secondary | ICD-10-CM | POA: Diagnosis not present

## 2015-12-17 DIAGNOSIS — N186 End stage renal disease: Secondary | ICD-10-CM | POA: Diagnosis not present

## 2015-12-17 DIAGNOSIS — E162 Hypoglycemia, unspecified: Secondary | ICD-10-CM | POA: Diagnosis not present

## 2015-12-20 DIAGNOSIS — Z23 Encounter for immunization: Secondary | ICD-10-CM | POA: Diagnosis not present

## 2015-12-20 DIAGNOSIS — N186 End stage renal disease: Secondary | ICD-10-CM | POA: Diagnosis not present

## 2015-12-20 DIAGNOSIS — D509 Iron deficiency anemia, unspecified: Secondary | ICD-10-CM | POA: Diagnosis not present

## 2015-12-20 DIAGNOSIS — E162 Hypoglycemia, unspecified: Secondary | ICD-10-CM | POA: Diagnosis not present

## 2015-12-20 DIAGNOSIS — N2581 Secondary hyperparathyroidism of renal origin: Secondary | ICD-10-CM | POA: Diagnosis not present

## 2015-12-20 DIAGNOSIS — Z992 Dependence on renal dialysis: Secondary | ICD-10-CM | POA: Diagnosis not present

## 2015-12-20 DIAGNOSIS — D631 Anemia in chronic kidney disease: Secondary | ICD-10-CM | POA: Diagnosis not present

## 2015-12-22 DIAGNOSIS — D631 Anemia in chronic kidney disease: Secondary | ICD-10-CM | POA: Diagnosis not present

## 2015-12-22 DIAGNOSIS — E162 Hypoglycemia, unspecified: Secondary | ICD-10-CM | POA: Diagnosis not present

## 2015-12-22 DIAGNOSIS — Z23 Encounter for immunization: Secondary | ICD-10-CM | POA: Diagnosis not present

## 2015-12-22 DIAGNOSIS — N186 End stage renal disease: Secondary | ICD-10-CM | POA: Diagnosis not present

## 2015-12-22 DIAGNOSIS — D509 Iron deficiency anemia, unspecified: Secondary | ICD-10-CM | POA: Diagnosis not present

## 2015-12-22 DIAGNOSIS — Z992 Dependence on renal dialysis: Secondary | ICD-10-CM | POA: Diagnosis not present

## 2015-12-22 DIAGNOSIS — N2581 Secondary hyperparathyroidism of renal origin: Secondary | ICD-10-CM | POA: Diagnosis not present

## 2015-12-24 DIAGNOSIS — D509 Iron deficiency anemia, unspecified: Secondary | ICD-10-CM | POA: Diagnosis not present

## 2015-12-24 DIAGNOSIS — N186 End stage renal disease: Secondary | ICD-10-CM | POA: Diagnosis not present

## 2015-12-24 DIAGNOSIS — E162 Hypoglycemia, unspecified: Secondary | ICD-10-CM | POA: Diagnosis not present

## 2015-12-24 DIAGNOSIS — N2581 Secondary hyperparathyroidism of renal origin: Secondary | ICD-10-CM | POA: Diagnosis not present

## 2015-12-24 DIAGNOSIS — E11649 Type 2 diabetes mellitus with hypoglycemia without coma: Secondary | ICD-10-CM | POA: Diagnosis not present

## 2015-12-24 DIAGNOSIS — D631 Anemia in chronic kidney disease: Secondary | ICD-10-CM | POA: Diagnosis not present

## 2015-12-24 DIAGNOSIS — Z992 Dependence on renal dialysis: Secondary | ICD-10-CM | POA: Diagnosis not present

## 2015-12-27 DIAGNOSIS — D509 Iron deficiency anemia, unspecified: Secondary | ICD-10-CM | POA: Diagnosis not present

## 2015-12-27 DIAGNOSIS — N186 End stage renal disease: Secondary | ICD-10-CM | POA: Diagnosis not present

## 2015-12-27 DIAGNOSIS — N2581 Secondary hyperparathyroidism of renal origin: Secondary | ICD-10-CM | POA: Diagnosis not present

## 2015-12-27 DIAGNOSIS — D631 Anemia in chronic kidney disease: Secondary | ICD-10-CM | POA: Diagnosis not present

## 2015-12-27 DIAGNOSIS — Z992 Dependence on renal dialysis: Secondary | ICD-10-CM | POA: Diagnosis not present

## 2015-12-27 DIAGNOSIS — E162 Hypoglycemia, unspecified: Secondary | ICD-10-CM | POA: Diagnosis not present

## 2015-12-29 DIAGNOSIS — D631 Anemia in chronic kidney disease: Secondary | ICD-10-CM | POA: Diagnosis not present

## 2015-12-29 DIAGNOSIS — N186 End stage renal disease: Secondary | ICD-10-CM | POA: Diagnosis not present

## 2015-12-29 DIAGNOSIS — Z992 Dependence on renal dialysis: Secondary | ICD-10-CM | POA: Diagnosis not present

## 2015-12-29 DIAGNOSIS — D509 Iron deficiency anemia, unspecified: Secondary | ICD-10-CM | POA: Diagnosis not present

## 2015-12-29 DIAGNOSIS — N2581 Secondary hyperparathyroidism of renal origin: Secondary | ICD-10-CM | POA: Diagnosis not present

## 2015-12-29 DIAGNOSIS — E162 Hypoglycemia, unspecified: Secondary | ICD-10-CM | POA: Diagnosis not present

## 2015-12-31 DIAGNOSIS — Z992 Dependence on renal dialysis: Secondary | ICD-10-CM | POA: Diagnosis not present

## 2015-12-31 DIAGNOSIS — D631 Anemia in chronic kidney disease: Secondary | ICD-10-CM | POA: Diagnosis not present

## 2015-12-31 DIAGNOSIS — D509 Iron deficiency anemia, unspecified: Secondary | ICD-10-CM | POA: Diagnosis not present

## 2015-12-31 DIAGNOSIS — N2581 Secondary hyperparathyroidism of renal origin: Secondary | ICD-10-CM | POA: Diagnosis not present

## 2015-12-31 DIAGNOSIS — N186 End stage renal disease: Secondary | ICD-10-CM | POA: Diagnosis not present

## 2015-12-31 DIAGNOSIS — E162 Hypoglycemia, unspecified: Secondary | ICD-10-CM | POA: Diagnosis not present

## 2016-01-03 DIAGNOSIS — N186 End stage renal disease: Secondary | ICD-10-CM | POA: Diagnosis not present

## 2016-01-03 DIAGNOSIS — Z992 Dependence on renal dialysis: Secondary | ICD-10-CM | POA: Diagnosis not present

## 2016-01-03 DIAGNOSIS — D509 Iron deficiency anemia, unspecified: Secondary | ICD-10-CM | POA: Diagnosis not present

## 2016-01-03 DIAGNOSIS — D631 Anemia in chronic kidney disease: Secondary | ICD-10-CM | POA: Diagnosis not present

## 2016-01-03 DIAGNOSIS — N2581 Secondary hyperparathyroidism of renal origin: Secondary | ICD-10-CM | POA: Diagnosis not present

## 2016-01-03 DIAGNOSIS — E162 Hypoglycemia, unspecified: Secondary | ICD-10-CM | POA: Diagnosis not present

## 2016-01-04 DIAGNOSIS — E1121 Type 2 diabetes mellitus with diabetic nephropathy: Secondary | ICD-10-CM | POA: Diagnosis not present

## 2016-01-04 DIAGNOSIS — E8779 Other fluid overload: Secondary | ICD-10-CM | POA: Diagnosis not present

## 2016-01-04 DIAGNOSIS — N185 Chronic kidney disease, stage 5: Secondary | ICD-10-CM | POA: Diagnosis not present

## 2016-01-04 DIAGNOSIS — N186 End stage renal disease: Secondary | ICD-10-CM | POA: Diagnosis not present

## 2016-01-04 DIAGNOSIS — E8770 Fluid overload, unspecified: Secondary | ICD-10-CM | POA: Diagnosis not present

## 2016-01-04 DIAGNOSIS — I10 Essential (primary) hypertension: Secondary | ICD-10-CM | POA: Diagnosis not present

## 2016-01-04 DIAGNOSIS — R1912 Hyperactive bowel sounds: Secondary | ICD-10-CM | POA: Diagnosis not present

## 2016-01-04 DIAGNOSIS — Z992 Dependence on renal dialysis: Secondary | ICD-10-CM | POA: Diagnosis not present

## 2016-01-04 DIAGNOSIS — Z6821 Body mass index (BMI) 21.0-21.9, adult: Secondary | ICD-10-CM | POA: Diagnosis not present

## 2016-01-05 DIAGNOSIS — D631 Anemia in chronic kidney disease: Secondary | ICD-10-CM | POA: Diagnosis not present

## 2016-01-05 DIAGNOSIS — D509 Iron deficiency anemia, unspecified: Secondary | ICD-10-CM | POA: Diagnosis not present

## 2016-01-05 DIAGNOSIS — Z992 Dependence on renal dialysis: Secondary | ICD-10-CM | POA: Diagnosis not present

## 2016-01-05 DIAGNOSIS — E162 Hypoglycemia, unspecified: Secondary | ICD-10-CM | POA: Diagnosis not present

## 2016-01-05 DIAGNOSIS — N186 End stage renal disease: Secondary | ICD-10-CM | POA: Diagnosis not present

## 2016-01-05 DIAGNOSIS — N2581 Secondary hyperparathyroidism of renal origin: Secondary | ICD-10-CM | POA: Diagnosis not present

## 2016-01-06 DIAGNOSIS — R197 Diarrhea, unspecified: Secondary | ICD-10-CM | POA: Diagnosis not present

## 2016-01-07 DIAGNOSIS — D509 Iron deficiency anemia, unspecified: Secondary | ICD-10-CM | POA: Diagnosis not present

## 2016-01-07 DIAGNOSIS — D631 Anemia in chronic kidney disease: Secondary | ICD-10-CM | POA: Diagnosis not present

## 2016-01-07 DIAGNOSIS — E162 Hypoglycemia, unspecified: Secondary | ICD-10-CM | POA: Diagnosis not present

## 2016-01-07 DIAGNOSIS — N186 End stage renal disease: Secondary | ICD-10-CM | POA: Diagnosis not present

## 2016-01-07 DIAGNOSIS — N2581 Secondary hyperparathyroidism of renal origin: Secondary | ICD-10-CM | POA: Diagnosis not present

## 2016-01-07 DIAGNOSIS — Z992 Dependence on renal dialysis: Secondary | ICD-10-CM | POA: Diagnosis not present

## 2016-01-10 DIAGNOSIS — Z992 Dependence on renal dialysis: Secondary | ICD-10-CM | POA: Diagnosis not present

## 2016-01-10 DIAGNOSIS — N2581 Secondary hyperparathyroidism of renal origin: Secondary | ICD-10-CM | POA: Diagnosis not present

## 2016-01-10 DIAGNOSIS — E162 Hypoglycemia, unspecified: Secondary | ICD-10-CM | POA: Diagnosis not present

## 2016-01-10 DIAGNOSIS — D631 Anemia in chronic kidney disease: Secondary | ICD-10-CM | POA: Diagnosis not present

## 2016-01-10 DIAGNOSIS — N186 End stage renal disease: Secondary | ICD-10-CM | POA: Diagnosis not present

## 2016-01-10 DIAGNOSIS — D509 Iron deficiency anemia, unspecified: Secondary | ICD-10-CM | POA: Diagnosis not present

## 2016-01-12 DIAGNOSIS — Z794 Long term (current) use of insulin: Secondary | ICD-10-CM | POA: Diagnosis not present

## 2016-01-12 DIAGNOSIS — E162 Hypoglycemia, unspecified: Secondary | ICD-10-CM | POA: Diagnosis not present

## 2016-01-12 DIAGNOSIS — N2581 Secondary hyperparathyroidism of renal origin: Secondary | ICD-10-CM | POA: Diagnosis not present

## 2016-01-12 DIAGNOSIS — N186 End stage renal disease: Secondary | ICD-10-CM | POA: Diagnosis not present

## 2016-01-12 DIAGNOSIS — D509 Iron deficiency anemia, unspecified: Secondary | ICD-10-CM | POA: Diagnosis not present

## 2016-01-12 DIAGNOSIS — E119 Type 2 diabetes mellitus without complications: Secondary | ICD-10-CM | POA: Diagnosis not present

## 2016-01-12 DIAGNOSIS — Z992 Dependence on renal dialysis: Secondary | ICD-10-CM | POA: Diagnosis not present

## 2016-01-12 DIAGNOSIS — D631 Anemia in chronic kidney disease: Secondary | ICD-10-CM | POA: Diagnosis not present

## 2016-01-13 ENCOUNTER — Emergency Department (HOSPITAL_COMMUNITY): Payer: Medicare Other

## 2016-01-13 ENCOUNTER — Inpatient Hospital Stay (HOSPITAL_COMMUNITY)
Admission: EM | Admit: 2016-01-13 | Discharge: 2016-01-15 | DRG: 371 | Disposition: A | Discharge status: Home / Self Care | Payer: Medicare Other | Attending: Internal Medicine | Admitting: Internal Medicine

## 2016-01-13 ENCOUNTER — Encounter (HOSPITAL_COMMUNITY): Payer: Self-pay | Admitting: Emergency Medicine

## 2016-01-13 DIAGNOSIS — A044 Other intestinal Escherichia coli infections: Secondary | ICD-10-CM | POA: Diagnosis not present

## 2016-01-13 DIAGNOSIS — Z992 Dependence on renal dialysis: Secondary | ICD-10-CM

## 2016-01-13 DIAGNOSIS — E1122 Type 2 diabetes mellitus with diabetic chronic kidney disease: Secondary | ICD-10-CM | POA: Diagnosis not present

## 2016-01-13 DIAGNOSIS — E785 Hyperlipidemia, unspecified: Secondary | ICD-10-CM | POA: Diagnosis present

## 2016-01-13 DIAGNOSIS — E119 Type 2 diabetes mellitus without complications: Secondary | ICD-10-CM

## 2016-01-13 DIAGNOSIS — D631 Anemia in chronic kidney disease: Secondary | ICD-10-CM | POA: Diagnosis present

## 2016-01-13 DIAGNOSIS — N186 End stage renal disease: Secondary | ICD-10-CM | POA: Diagnosis not present

## 2016-01-13 DIAGNOSIS — Z91048 Other nonmedicinal substance allergy status: Secondary | ICD-10-CM

## 2016-01-13 DIAGNOSIS — Z880 Allergy status to penicillin: Secondary | ICD-10-CM

## 2016-01-13 DIAGNOSIS — I120 Hypertensive chronic kidney disease with stage 5 chronic kidney disease or end stage renal disease: Secondary | ICD-10-CM | POA: Diagnosis not present

## 2016-01-13 DIAGNOSIS — R197 Diarrhea, unspecified: Secondary | ICD-10-CM

## 2016-01-13 DIAGNOSIS — N289 Disorder of kidney and ureter, unspecified: Secondary | ICD-10-CM | POA: Diagnosis not present

## 2016-01-13 DIAGNOSIS — Z8673 Personal history of transient ischemic attack (TIA), and cerebral infarction without residual deficits: Secondary | ICD-10-CM

## 2016-01-13 DIAGNOSIS — I10 Essential (primary) hypertension: Secondary | ICD-10-CM | POA: Diagnosis present

## 2016-01-13 DIAGNOSIS — A0472 Enterocolitis due to Clostridium difficile, not specified as recurrent: Secondary | ICD-10-CM | POA: Diagnosis not present

## 2016-01-13 DIAGNOSIS — I953 Hypotension of hemodialysis: Secondary | ICD-10-CM | POA: Diagnosis not present

## 2016-01-13 DIAGNOSIS — T8612 Kidney transplant failure: Secondary | ICD-10-CM | POA: Diagnosis present

## 2016-01-13 DIAGNOSIS — Z87891 Personal history of nicotine dependence: Secondary | ICD-10-CM

## 2016-01-13 DIAGNOSIS — M898X9 Other specified disorders of bone, unspecified site: Secondary | ICD-10-CM | POA: Diagnosis present

## 2016-01-13 DIAGNOSIS — N189 Chronic kidney disease, unspecified: Secondary | ICD-10-CM

## 2016-01-13 HISTORY — DX: Hyperlipidemia, unspecified: E78.5

## 2016-01-13 HISTORY — DX: Long term (current) use of unspecified immunomodulators and immunosuppressants: Z79.60

## 2016-01-13 HISTORY — DX: Other intervertebral disc degeneration, lumbar region without mention of lumbar back pain or lower extremity pain: M51.369

## 2016-01-13 HISTORY — DX: Other intervertebral disc degeneration, lumbar region: M51.36

## 2016-01-13 HISTORY — DX: Edema, unspecified: R60.9

## 2016-01-13 HISTORY — DX: End stage renal disease: N18.6

## 2016-01-13 HISTORY — DX: Unspecified atrial flutter: I48.92

## 2016-01-13 HISTORY — DX: Spondylosis without myelopathy or radiculopathy, lumbar region: M47.816

## 2016-01-13 HISTORY — DX: Essential (primary) hypertension: I10

## 2016-01-13 HISTORY — DX: Cerebral infarction, unspecified: I63.9

## 2016-01-13 HISTORY — DX: Other long term (current) drug therapy: Z79.899

## 2016-01-13 HISTORY — DX: Type 2 diabetes mellitus without complications: E11.9

## 2016-01-13 HISTORY — DX: Localized edema: R60.0

## 2016-01-13 LAB — CBC WITH DIFFERENTIAL/PLATELET
BASOS ABS: 0 10*3/uL (ref 0.0–0.1)
BASOS ABS: 0 10*3/uL (ref 0.0–0.1)
BASOS PCT: 1 %
BASOS PCT: 1 %
EOS ABS: 0.3 10*3/uL (ref 0.0–0.7)
EOS PCT: 5 %
Eosinophils Absolute: 0.3 10*3/uL (ref 0.0–0.7)
Eosinophils Relative: 4 %
HEMATOCRIT: 39.5 % (ref 39.0–52.0)
HEMATOCRIT: 35.8 % — AB (ref 39.0–52.0)
Hemoglobin: 12.6 g/dL — ABNORMAL LOW (ref 13.0–17.0)
Hemoglobin: 11.7 g/dL — ABNORMAL LOW (ref 13.0–17.0)
Lymphocytes Relative: 28 %
Lymphocytes Relative: 31 %
Lymphs Abs: 1.9 10*3/uL (ref 0.7–4.0)
Lymphs Abs: 1.9 10*3/uL (ref 0.7–4.0)
MCH: 31.6 pg (ref 26.0–34.0)
MCH: 31.7 pg (ref 26.0–34.0)
MCHC: 31.9 g/dL (ref 30.0–36.0)
MCHC: 32.7 g/dL (ref 30.0–36.0)
MCV: 99 fL (ref 78.0–100.0)
MCV: 97 fL (ref 78.0–100.0)
MONO ABS: 0.7 10*3/uL (ref 0.1–1.0)
MONO ABS: 0.6 10*3/uL (ref 0.1–1.0)
MONOS PCT: 10 %
Monocytes Relative: 10 %
NEUTROS ABS: 4 10*3/uL (ref 1.7–7.7)
NEUTROS ABS: 3.3 10*3/uL (ref 1.7–7.7)
NEUTROS PCT: 57 %
Neutrophils Relative %: 53 %
PLATELETS: 179 10*3/uL (ref 150–400)
Platelets: 211 10*3/uL (ref 150–400)
RBC: 3.99 MIL/uL — ABNORMAL LOW (ref 4.22–5.81)
RBC: 3.69 MIL/uL — AB (ref 4.22–5.81)
RDW: 14.4 % (ref 11.5–15.5)
RDW: 14 % (ref 11.5–15.5)
WBC: 6.9 10*3/uL (ref 4.0–10.5)
WBC: 6.2 10*3/uL (ref 4.0–10.5)

## 2016-01-13 LAB — GLUCOSE, CAPILLARY: GLUCOSE-CAPILLARY: 152 mg/dL — AB (ref 65–99)

## 2016-01-13 LAB — COMPREHENSIVE METABOLIC PANEL
ALT: 11 U/L — ABNORMAL LOW (ref 17–63)
ANION GAP: 12 (ref 5–15)
AST: 14 U/L — ABNORMAL LOW (ref 15–41)
Albumin: 3.4 g/dL — ABNORMAL LOW (ref 3.5–5.0)
Alkaline Phosphatase: 79 U/L (ref 38–126)
BUN: 35 mg/dL — ABNORMAL HIGH (ref 6–20)
CALCIUM: 8.3 mg/dL — AB (ref 8.9–10.3)
CHLORIDE: 99 mmol/L — AB (ref 101–111)
CO2: 28 mmol/L (ref 22–32)
Creatinine, Ser: 6.41 mg/dL — ABNORMAL HIGH (ref 0.61–1.24)
GFR calc non Af Amer: 8 mL/min — ABNORMAL LOW (ref >60)
GFR, EST AFRICAN AMERICAN: 9 mL/min — AB (ref >60)
Glucose, Bld: 125 mg/dL — ABNORMAL HIGH (ref 65–99)
Potassium: 3.8 mmol/L (ref 3.5–5.1)
SODIUM: 139 mmol/L (ref 135–145)
Total Bilirubin: 0.5 mg/dL (ref 0.3–1.2)
Total Protein: 6.6 g/dL (ref 6.5–8.1)

## 2016-01-13 LAB — C DIFFICILE QUICK SCREEN W PCR REFLEX
C DIFFICILE (CDIFF) INTERP: NOT DETECTED
C DIFFICILE (CDIFF) TOXIN: NEGATIVE
C DIFFICLE (CDIFF) ANTIGEN: NEGATIVE

## 2016-01-13 LAB — LACTIC ACID, PLASMA
LACTIC ACID, VENOUS: 1.3 mmol/L (ref 0.5–1.9)
LACTIC ACID, VENOUS: 1.1 mmol/L (ref 0.5–1.9)

## 2016-01-13 LAB — LIPASE, BLOOD: LIPASE: 26 U/L (ref 11–51)

## 2016-01-13 MED ORDER — ASPIRIN EC 81 MG PO TBEC
81.0000 mg | DELAYED_RELEASE_TABLET | Freq: Every day | ORAL | Status: DC
  Administered 2016-01-14 – 2016-01-15 (×2): 81 mg via ORAL
  Filled 2016-01-13 (×2): qty 1

## 2016-01-13 MED ORDER — SODIUM BICARBONATE 650 MG PO TABS
1300.0000 mg | ORAL_TABLET | Freq: Two times a day (BID) | ORAL | Status: DC
  Administered 2016-01-13 – 2016-01-15 (×4): 1300 mg via ORAL
  Filled 2016-01-13 (×4): qty 2

## 2016-01-13 MED ORDER — VITAMIN D 1000 UNITS PO TABS
2000.0000 [IU] | ORAL_TABLET | Freq: Every day | ORAL | Status: DC
  Administered 2016-01-14 – 2016-01-15 (×2): 2000 [IU] via ORAL
  Filled 2016-01-13 (×2): qty 2

## 2016-01-13 MED ORDER — METRONIDAZOLE 500 MG PO TABS
500.0000 mg | ORAL_TABLET | Freq: Two times a day (BID) | ORAL | Status: DC
  Administered 2016-01-13 – 2016-01-14 (×2): 500 mg via ORAL
  Filled 2016-01-13 (×2): qty 1

## 2016-01-13 MED ORDER — DOCOSAHEXAENOIC ACID 300 MG PO CAPS: 1200.0000 mg | ORAL_CAPSULE | Freq: Two times a day (BID) | ORAL | Status: DC

## 2016-01-13 MED ORDER — PREDNISONE 10 MG PO TABS
5.0000 mg | ORAL_TABLET | Freq: Every day | ORAL | Status: DC
  Administered 2016-01-14 – 2016-01-15 (×2): 5 mg via ORAL
  Filled 2016-01-13 (×2): qty 1

## 2016-01-13 MED ORDER — PANTOPRAZOLE SODIUM 40 MG PO TBEC
40.0000 mg | DELAYED_RELEASE_TABLET | Freq: Every day | ORAL | Status: DC
  Administered 2016-01-13 – 2016-01-14 (×2): 40 mg via ORAL
  Filled 2016-01-13 (×3): qty 1

## 2016-01-13 MED ORDER — VANCOMYCIN HCL 125 MG PO CAPS: 250.0000 mg | ORAL_CAPSULE | Freq: Four times a day (QID) | ORAL | Status: DC

## 2016-01-13 MED ORDER — SODIUM CHLORIDE 0.9 % IV SOLN
INTRAVENOUS | Status: DC
  Administered 2016-01-13: 100 mL/h via INTRAVENOUS

## 2016-01-13 MED ORDER — ONDANSETRON HCL 4 MG/2ML IJ SOLN: 4.0000 mg | Freq: Four times a day (QID) | INTRAMUSCULAR | Status: DC | PRN

## 2016-01-13 MED ORDER — VANCOMYCIN 50 MG/ML ORAL SOLUTION
250.0000 mg | Freq: Four times a day (QID) | ORAL | Status: DC
  Administered 2016-01-14 (×2): 250 mg via ORAL
  Filled 2016-01-13 (×15): qty 5

## 2016-01-13 MED ORDER — ONDANSETRON HCL 4 MG PO TABS: 4.0000 mg | ORAL_TABLET | Freq: Four times a day (QID) | ORAL | Status: DC | PRN

## 2016-01-13 MED ORDER — SODIUM CHLORIDE 0.9 % IV SOLN
INTRAVENOUS | Status: AC
  Administered 2016-01-14: 06:00:00 via INTRAVENOUS

## 2016-01-13 MED ORDER — ENOXAPARIN SODIUM 30 MG/0.3ML ~~LOC~~ SOLN
30.0000 mg | SUBCUTANEOUS | Status: DC
  Administered 2016-01-13 – 2016-01-14 (×2): 30 mg via SUBCUTANEOUS
  Filled 2016-01-13 (×2): qty 0.3

## 2016-01-13 NOTE — H&P (Signed)
History and Physical    OM Wayne Hopkins:[id number] DOB: [birth year] DOA: 01/13/2016  PCP: Neale Burly, MD   Patient coming from: Home.  Chief Complaint: Diarrhea.  HPI: Wayne Hopkins is a 75 y.o. male with medical history significant of atrial flutter, lumbar degenerative disc disease, type 2 diabetes, ESRD on hemodialysis, hyperlipidemia, hypertension, long-term use of immunosuppressant medication who comes to the emergency department due to persistent diarrhea since about a month ago. The patient was at [facility] where he was seen and given vancomycin 125 mg 4 times a day orally without significant change in symptoms. The patient states that he has been having 15 or more loose stools bowel movements a day. He denies fever, chills, abdominal pain, nausea, emesis, melena or hematochezia. He denies chest pain, dyspnea, palpitations, but states that he feels lightheaded sometimes when he stands up.  ED Course: He was put on enteric precautions in the emergency department. His workup showed WBC of 6.9, hemoglobin of 12.6 g/dL, lipase 26 units. Glucose was 125, BUN was 35 and creatinine 6.41 mg/dL.  Review of Systems: As per HPI otherwise 10 point review of systems negative.    Past Medical History:  Diagnosis Date  . Atrial flutter ([place])   . DDD (degenerative disc disease), lumbar   . Diabetes mellitus without complication ([place])   . ESRD (end stage renal disease) ([place])   . Hyperlipidemia   . Hypertension   . Long-term use of immunosuppressant medication   . Lumbar spondylosis   . Peripheral edema   . Stroke [facility])     Past Surgical History:  Procedure Laterality Date  . BACK SURGERY    . CHOLECYSTECTOMY    . KIDNEY TRANSPLANT  2005     reports that he has quit smoking. He has never used smokeless tobacco. He reports that he does not drink alcohol or use drugs.  Allergies  Allergen Reactions  . Penicillins Swelling  . Tape Other (See Comments)    Pt states he  cannot use plastic tape, tears his skin    Family History  Problem Relation Age of Onset  . Stroke Mother   . Heart attack Father   . Kidney failure Sister   . Kidney failure Other     Prior to Admission medications   Medication Sig Start Date End Date Taking? Authorizing Provider  aspirin EC 81 MG tablet Take 81 mg by mouth daily. 11/03/14  Yes Historical Provider, MD  Cholecalciferol (VITAMIN D-1000 MAX ST) 1000 units tablet Take 2 tablets by mouth daily. 05/24/15  Yes Historical Provider, MD  DOCOSAHEXAENOIC ACID PO Take 1,200 mg by mouth 2 (two) times daily.   Yes Historical Provider, MD  predniSONE (DELTASONE) 10 MG tablet Take 5 mg by mouth daily. 09/28/15  Yes Historical Provider, MD  sodium bicarbonate 650 MG tablet Take 1,300 mg by mouth 2 (two) times daily. 12/03/13  Yes Historical Provider, MD  vancomycin (VANCOCIN) 125 MG capsule Take 125 mg by mouth every 6 (six) hours. 01/04/16 01/14/16 Yes Historical Provider, MD    Physical Exam:  Constitutional: NAD, calm, comfortable Vitals:   01/13/16 1700 01/13/16 1730 01/13/16 1850 01/13/16 2128  BP: 149/65 148/61 (!) 141/62 (!) 108/40  Pulse: 71  75 77  Resp: 13 15 16 16   Temp:   99.2 F (37.3 C) 98.1 F (36.7 C)  TempSrc:    Oral  SpO2: 97%  97% 98%  Weight:   68.9 kg (152 lb)   Height:  5\' 10"  (1.778 m)    Eyes: PERRL, lids and conjunctivae normal ENMT: Mucous membranes are dry.  Posterior pharynx clear of any exudate or lesions. Neck: normal, supple, no masses, no thyromegaly Respiratory: Clear to auscultation bilaterally, no wheezing, no crackles. Normal respiratory effort. No accessory muscle use.  Cardiovascular: Regular rate and rhythm, no murmurs / rubs / gallops. No extremity edema. 2+ pedal pulses. No carotid bruits.  Abdomen: No distention, hyperactive bowel sounds, soft, no tenderness, no masses palpated. No hepatosplenomegaly.  Musculoskeletal: No clubbing / cyanosis. Good ROM, no contractures. Normal muscle  tone.  Skin: No rashes, lesions, ulcers. No induration Neurologic: CN 2-12 grossly intact. Sensation intact, DTR normal. Strength 5/5 in all 4.  Psychiatric: Normal judgment and insight. Alert and oriented x 3. Normal mood.   Labs on Admission: I have personally reviewed following labs and imaging studies  CBC:  Recent Labs Lab 01/13/16 1304  WBC 6.9  NEUTROABS 4.0  HGB 12.6*  HCT 39.5  MCV 99.0  PLT 425   Basic Metabolic Panel:  Recent Labs Lab 01/13/16 1304  NA 139  K 3.8  CL 99*  CO2 28  GLUCOSE 125*  BUN 35*  CREATININE 6.41*  CALCIUM 8.3*   GFR: Estimated Creatinine Clearance: 9.6 mL/min (by C-G formula based on SCr of 6.41 mg/dL (H)). Liver Function Tests:  Recent Labs Lab 01/13/16 1304  AST 14*  ALT 11*  ALKPHOS 79  BILITOT 0.5  PROT 6.6  ALBUMIN 3.4*    Recent Labs Lab 01/13/16 1304  LIPASE 26   No results for input(s): AMMONIA in the last 168 hours. Coagulation Profile: No results for input(s): INR, PROTIME in the last 168 hours. Cardiac Enzymes: No results for input(s): CKTOTAL, CKMB, CKMBINDEX, TROPONINI in the last 168 hours. BNP (last 3 results) No results for input(s): PROBNP in the last 8760 hours. HbA1C: No results for input(s): HGBA1C in the last 72 hours. CBG:  Recent Labs Lab 01/13/16 2039  GLUCAP 152*   Lipid Profile: No results for input(s): CHOL, HDL, LDLCALC, TRIG, CHOLHDL, LDLDIRECT in the last 72 hours. Thyroid Function Tests: No results for input(s): TSH, T4TOTAL, FREET4, T3FREE, THYROIDAB in the last 72 hours. Anemia Panel: No results for input(s): VITAMINB12, FOLATE, FERRITIN, TIBC, IRON, RETICCTPCT in the last 72 hours. Urine analysis: No results found for: COLORURINE, APPEARANCEUR, LABSPEC, PHURINE, GLUCOSEU, HGBUR, BILIRUBINUR, KETONESUR, PROTEINUR, UROBILINOGEN, NITRITE, LEUKOCYTESUR  Recent Results (from the past 240 hour(s))  C difficile quick scan w PCR reflex     Status: None   Collection Time:  01/13/16 12:42 PM  Result Value Ref Range Status   C Diff antigen NEGATIVE NEGATIVE Final   C Diff toxin NEGATIVE NEGATIVE Final   C Diff interpretation No C. difficile detected.  Final     Radiological Exams on Admission: Ct Abdomen Pelvis Wo Contrast  Result Date: 01/13/2016 CLINICAL DATA:  Diarrhea for several months. On antibiotic therapy for 9 days without improvement. History of end-stage renal disease, renal transplant and diabetes. EXAM: CT ABDOMEN AND PELVIS WITHOUT CONTRAST TECHNIQUE: Multidetector CT imaging of the abdomen and pelvis was performed following the standard protocol without IV contrast. COMPARISON:  CTs 01/01/2013. FINDINGS: Lower chest: Extensive coronary artery atherosclerosis noted. There is lesser atherosclerosis and tortuosity of the thoracic aorta. The lung bases are clear. There is no pleural effusion. Bilateral gynecomastia noted. Hepatobiliary: The liver appears unremarkable as imaged in the noncontrast state. No significant biliary dilatation post cholecystectomy. Pancreas: Atrophied with fatty replacement. No evidence  of pancreatic mass, ductal dilatation or surrounding inflammation. Spleen: Normal in size without focal abnormality. Adrenals/Urinary Tract: The adrenal glands appear stable without suspicious findings. The native kidneys are markedly atrophied with diffuse cortical thinning. There are enlarging low-density renal lesions bilaterally, more numerous on the right, probably all cysts based on attenuation. There is no hydronephrosis. Transplanted kidney in the right iliac fossa demonstrates a probable cyst in its lower pole, measuring up to 2.0 cm. No evidence of urinary tract calculus per there is mild perinephric soft tissue stranding around the transplanted kidney. Prominence of the bladder wall is probably related to incomplete distention. Stomach/Bowel: The stomach and small bowel demonstrate no significant findings. The appendix appears normal. Allowing  for incomplete colonic distension, there is no definite colonic wall thickening or surrounding inflammation. There is stable laxity of the anterior abdominal wall in the right lower quadrant, likely a broad-based incisional hernia. This appears unchanged. Vascular/Lymphatic: There are no enlarged abdominal or pelvic lymph nodes. Extensive aortic and branch vessel atherosclerosis. Reproductive: The prostate gland and seminal vesicles appear unremarkable. Calcifications of the vas deferens noted. Other: No ascites. As above, stable laxity of the anterior abdominal wall in the right lower quadrant, probably an incisional hernia. Musculoskeletal: No acute or significant osseous findings. Previous L4 through S1 fusion. IMPRESSION: 1. No definite signs of colitis. The colon is incompletely distended without gross wall thickening or surrounding inflammation. 2. Perinephric soft tissue stranding around the transplant kidney in the right iliac fossa, potentially inflammatory. No evidence of urinary tract calculus or hydronephrosis. Probable cyst in the lower pole of the transplanted kidney. 3. Chronic atrophy of the native kidneys with enlarging low-density lesions, likely cysts. 4. Probable chronic incisional hernia in the right lower quadrant. 5. Extensive atherosclerosis. Electronically Signed   By: Richardean Sale M.D.   On: 01/13/2016 15:44    Assessment/Plan Principal Problem:   Clostridium difficile diarrhea Admit to MedSurg/observation. Continue careful IV hydration. Increase vancomycin to 250 mg by mouth 4 times a day. Flagyl 500 mg by mouth twice a day. Zofran as needed if the patient becomes nauseous.  Active Problems:   ESRD on dialysis [facility]) Scheduled to have dialysis tomorrow. Consult nephrology in a.m.    Anemia in chronic kidney disease Monitor hematocrit and hemoglobin. Erythropoietin supplementation per nephrology.    Hyperlipidemia Currently not on medical treatment. Continue  lifestyle modifications.    Essential hypertension Not on antihypertensives at this time. Monitor blood pressure.    Type 2 diabetes mellitus (HCC) Carbohydrate modified diet. CBG monitoring Regular Insulin sliding scale.     DVT prophylaxis: SCDs. Code Status: Full code. Family Communication:  Disposition Plan: Admit for gentle and time-limited IV hydration, oral vancomycin and Flagyl. Consults called: Nephrology (for hemodialysis) Admission status: Observation/MedSurg.    Reubin Milan MD [facility] Pager [phone].  If 7PM-7AM, please contact night-coverage [email or web address] Password [facility]  01/13/2016, 10:13 PM

## 2016-01-13 NOTE — ED Provider Notes (Signed)
[place] DEPT Provider Note   CSN: [id number] Arrival date & time: 01/13/16  1109     History   Chief Complaint Chief Complaint  Patient presents with  . Diarrhea    HPI Wayne Hopkins is a 75 y.o. male.  HPI Pt was seen at 1230. Per pt, c/o gradual onset and persistence of multiple intermittent episodes of diarrhea that began 1 month ago.   Describes the stools as "watery" and "loose."  Pt states he was evaluated by his PMD, "got a test that said I was positive for some kind of bacteria," and rx vancomycin. Pt has been taking the vancomycin since 01/07/16 without change in his symptoms. Denies abd pain, no CP/SOB, no back pain, no fevers, no black or blood in stools, no N/V.    Past Medical History:  Diagnosis Date  . Atrial flutter ([place])   . DDD (degenerative disc disease), lumbar   . Diabetes mellitus without complication ([place])   . ESRD (end stage renal disease) ([place])   . Hyperlipidemia   . Hypertension   . Long-term use of immunosuppressant medication   . Lumbar spondylosis   . Peripheral edema   . Stroke [facility])     There are no active problems to display for this patient.   Past Surgical History:  Procedure Laterality Date  . BACK SURGERY    . CHOLECYSTECTOMY    . KIDNEY TRANSPLANT  2005       Home Medications    Prior to Admission medications   Medication Sig Start Date End Date Taking? Authorizing Provider  aspirin EC 81 MG tablet Take 81 mg by mouth daily. 11/03/14  Yes Historical Provider, MD  Cholecalciferol (VITAMIN D-1000 MAX ST) 1000 units tablet Take 2 tablets by mouth daily. 05/24/15  Yes Historical Provider, MD  DOCOSAHEXAENOIC ACID PO Take 1,200 mg by mouth 2 (two) times daily.   Yes Historical Provider, MD  predniSONE (DELTASONE) 10 MG tablet Take 5 mg by mouth daily. 09/28/15  Yes Historical Provider, MD  sodium bicarbonate 650 MG tablet Take 1,300 mg by mouth 2 (two) times daily. 12/03/13  Yes Historical Provider, MD  vancomycin  (VANCOCIN) 125 MG capsule Take 125 mg by mouth every 6 (six) hours. 01/04/16 01/14/16 Yes Historical Provider, MD    Family History Family History  Problem Relation Age of Onset  . Stroke Mother   . Heart attack Father   . Kidney failure Sister   . Kidney failure Other     Social History Social History  Substance Use Topics  . Smoking status: Former Research scientist (life sciences)  . Smokeless tobacco: Never Used  . Alcohol use No     Allergies   Penicillins and Tape   Review of Systems Review of Systems ROS: Statement: All systems negative except as marked or noted in the HPI; Constitutional: Negative for fever and chills. ; ; Eyes: Negative for eye pain, redness and discharge. ; ; ENMT: Negative for ear pain, hoarseness, nasal congestion, sinus pressure and sore throat. ; ; Cardiovascular: Negative for chest pain, palpitations, diaphoresis, dyspnea and peripheral edema. ; ; Respiratory: Negative for cough, wheezing and stridor. ; ; Gastrointestinal: +diarrhea. Negative for nausea, vomiting, abdominal pain, blood in stool, hematemesis, jaundice and rectal bleeding. . ; ; Genitourinary: Negative for dysuria, flank pain and hematuria. ; ; Musculoskeletal: Negative for back pain and neck pain. Negative for swelling and trauma.; ; Skin: Negative for pruritus, rash, abrasions, blisters, bruising and skin lesion.; ; Neuro: Negative for headache, lightheadedness  and neck stiffness. Negative for weakness, altered level of consciousness, altered mental status, extremity weakness, paresthesias, involuntary movement, seizure and syncope.       Physical Exam Updated Vital Signs BP (!) 117/48   Pulse (!) 33   Temp 98.2 F (36.8 C) (Oral)   Resp 14   Ht 5\' 10"  (1.778 m)   Wt 152 lb (68.9 kg)   SpO2 98%   BMI 21.81 kg/m   Physical Exam 1235: Physical examination:  Nursing notes reviewed; Vital signs and O2 SAT reviewed;  Constitutional: Well developed, Well nourished, Well hydrated, In no acute distress;  Head:  Normocephalic, atraumatic; Eyes: EOMI, PERRL, No scleral icterus; ENMT: Mouth and pharynx normal, Mucous membranes moist; Neck: Supple, Full range of motion, No lymphadenopathy; Cardiovascular: Regular rate and rhythm, No gallop; Respiratory: Breath sounds clear & equal bilaterally, No wheezes.  Speaking full sentences with ease, Normal respiratory effort/excursion; Chest: Nontender, Movement normal; Abdomen: Soft, Nontender, Nondistended, Normal bowel sounds; Genitourinary: No CVA tenderness; Extremities: Pulses normal, No tenderness, No edema, No calf edema or asymmetry.; Neuro: AA&Ox3, Major CN grossly intact.  Speech clear. No gross focal motor or sensory deficits in extremities.; Skin: Color normal, Warm, Dry.   ED Treatments / Results  Labs (all labs ordered are listed, but only abnormal results are displayed)   EKG  EKG Interpretation None       Radiology   Procedures Procedures (including critical care time)  Medications Ordered in ED Medications  0.9 %  sodium chloride infusion (not administered)     Initial Impression / Assessment and Plan / ED Course  I have reviewed the triage vital signs and the nursing notes.  Pertinent labs & imaging results that were available during my care of the patient were reviewed by me and considered in my medical decision making (see chart for details).  MDM Reviewed: previous chart, vitals and nursing note Reviewed previous: labs Interpretation: labs and CT scan   Results for orders placed or performed during the hospital encounter of 01/13/16  Comprehensive metabolic panel  Result Value Ref Range   Sodium 139 135 - 145 mmol/L   Potassium 3.8 3.5 - 5.1 mmol/L   Chloride 99 (L) 101 - 111 mmol/L   CO2 28 22 - 32 mmol/L   Glucose, Bld 125 (H) 65 - 99 mg/dL   BUN 35 (H) 6 - 20 mg/dL   Creatinine, Ser 6.41 (H) 0.61 - 1.24 mg/dL   Calcium 8.3 (L) 8.9 - 10.3 mg/dL   Total Protein 6.6 6.5 - 8.1 g/dL   Albumin 3.4 (L) 3.5 - 5.0  g/dL   AST 14 (L) 15 - 41 U/L   ALT 11 (L) 17 - 63 U/L   Alkaline Phosphatase 79 38 - 126 U/L   Total Bilirubin 0.5 0.3 - 1.2 mg/dL   GFR calc non Af Amer 8 (L) >60 mL/min   GFR calc Af Amer 9 (L) >60 mL/min   Anion gap 12 5 - 15  Lipase, blood  Result Value Ref Range   Lipase 26 11 - 51 U/L  Lactic acid, plasma  Result Value Ref Range   Lactic Acid, Venous 1.3 0.5 - 1.9 mmol/L  CBC with Differential  Result Value Ref Range   WBC 6.9 4.0 - 10.5 K/uL   RBC 3.99 (L) 4.22 - 5.81 MIL/uL   Hemoglobin 12.6 (L) 13.0 - 17.0 g/dL   HCT 39.5 39.0 - 52.0 %   MCV 99.0 78.0 - 100.0 fL  MCH 31.6 26.0 - 34.0 pg   MCHC 31.9 30.0 - 36.0 g/dL   RDW 14.4 11.5 - 15.5 %   Platelets 211 150 - 400 K/uL   Neutrophils Relative % 57 %   Neutro Abs 4.0 1.7 - 7.7 K/uL   Lymphocytes Relative 28 %   Lymphs Abs 1.9 0.7 - 4.0 K/uL   Monocytes Relative 10 %   Monocytes Absolute 0.7 0.1 - 1.0 K/uL   Eosinophils Relative 4 %   Eosinophils Absolute 0.3 0.0 - 0.7 K/uL   Basophils Relative 1 %   Basophils Absolute 0.0 0.0 - 0.1 K/uL   Ct Abdomen Pelvis Wo Contrast Result Date: 01/13/2016 CLINICAL DATA:  Diarrhea for several months. On antibiotic therapy for 9 days without improvement. History of end-stage renal disease, renal transplant and diabetes. EXAM: CT ABDOMEN AND PELVIS WITHOUT CONTRAST TECHNIQUE: Multidetector CT imaging of the abdomen and pelvis was performed following the standard protocol without IV contrast. COMPARISON:  CTs 01/01/2013. FINDINGS: Lower chest: Extensive coronary artery atherosclerosis noted. There is lesser atherosclerosis and tortuosity of the thoracic aorta. The lung bases are clear. There is no pleural effusion. Bilateral gynecomastia noted. Hepatobiliary: The liver appears unremarkable as imaged in the noncontrast state. No significant biliary dilatation post cholecystectomy. Pancreas: Atrophied with fatty replacement. No evidence of pancreatic mass, ductal dilatation or surrounding  inflammation. Spleen: Normal in size without focal abnormality. Adrenals/Urinary Tract: The adrenal glands appear stable without suspicious findings. The native kidneys are markedly atrophied with diffuse cortical thinning. There are enlarging low-density renal lesions bilaterally, more numerous on the right, probably all cysts based on attenuation. There is no hydronephrosis. Transplanted kidney in the right iliac fossa demonstrates a probable cyst in its lower pole, measuring up to 2.0 cm. No evidence of urinary tract calculus per there is mild perinephric soft tissue stranding around the transplanted kidney. Prominence of the bladder wall is probably related to incomplete distention. Stomach/Bowel: The stomach and small bowel demonstrate no significant findings. The appendix appears normal. Allowing for incomplete colonic distension, there is no definite colonic wall thickening or surrounding inflammation. There is stable laxity of the anterior abdominal wall in the right lower quadrant, likely a broad-based incisional hernia. This appears unchanged. Vascular/Lymphatic: There are no enlarged abdominal or pelvic lymph nodes. Extensive aortic and branch vessel atherosclerosis. Reproductive: The prostate gland and seminal vesicles appear unremarkable. Calcifications of the vas deferens noted. Other: No ascites. As above, stable laxity of the anterior abdominal wall in the right lower quadrant, probably an incisional hernia. Musculoskeletal: No acute or significant osseous findings. Previous L4 through S1 fusion. IMPRESSION: 1. No definite signs of colitis. The colon is incompletely distended without gross wall thickening or surrounding inflammation. 2. Perinephric soft tissue stranding around the transplant kidney in the right iliac fossa, potentially inflammatory. No evidence of urinary tract calculus or hydronephrosis. Probable cyst in the lower pole of the transplanted kidney. 3. Chronic atrophy of the native  kidneys with enlarging low-density lesions, likely cysts. 4. Probable chronic incisional hernia in the right lower quadrant. 5. Extensive atherosclerosis. Electronically Signed   By: Richardean Sale M.D.   On: 01/13/2016 15:44    1610:  [facility] lab records received: stool +cdiff.  CT scan today without colitis. Pt had multiple stools while in the ED. Concern for immunocompromise with +cdiff. T/C to [facility]. Olevia Bowens, case discussed, including:  HPI, pertinent PM/SHx, VS/PE, dx testing, ED course and treatment:  Agreeable to admit, requests to write temporary orders, obtain  observation medical bed to team APAdmits.   Final Clinical Impressions(s) / ED Diagnoses   Final diagnoses:  None    New Prescriptions New Prescriptions   No medications on file     Francine Graven, DO 01/17/16 1652

## 2016-01-13 NOTE — ED Notes (Signed)
Assisted to bathroom

## 2016-01-13 NOTE — ED Notes (Signed)
Pt drinking contrast. 

## 2016-01-13 NOTE — ED Triage Notes (Signed)
Pt reports diarrhea for several months. Pt states his stool was tested and he "was positive for some kind of bacteria in my small intestine. Pt was placed on vancomycin for treatment 9 days ago. Pt reports symptoms are no better.

## 2016-01-14 DIAGNOSIS — Z992 Dependence on renal dialysis: Secondary | ICD-10-CM | POA: Diagnosis not present

## 2016-01-14 DIAGNOSIS — I10 Essential (primary) hypertension: Secondary | ICD-10-CM | POA: Diagnosis not present

## 2016-01-14 DIAGNOSIS — Z91048 Other nonmedicinal substance allergy status: Secondary | ICD-10-CM | POA: Diagnosis not present

## 2016-01-14 DIAGNOSIS — A0472 Enterocolitis due to Clostridium difficile, not specified as recurrent: Secondary | ICD-10-CM | POA: Diagnosis not present

## 2016-01-14 DIAGNOSIS — I120 Hypertensive chronic kidney disease with stage 5 chronic kidney disease or end stage renal disease: Secondary | ICD-10-CM | POA: Diagnosis present

## 2016-01-14 DIAGNOSIS — D631 Anemia in chronic kidney disease: Secondary | ICD-10-CM

## 2016-01-14 DIAGNOSIS — E785 Hyperlipidemia, unspecified: Secondary | ICD-10-CM

## 2016-01-14 DIAGNOSIS — Z87891 Personal history of nicotine dependence: Secondary | ICD-10-CM | POA: Diagnosis not present

## 2016-01-14 DIAGNOSIS — M898X9 Other specified disorders of bone, unspecified site: Secondary | ICD-10-CM | POA: Diagnosis present

## 2016-01-14 DIAGNOSIS — N186 End stage renal disease: Secondary | ICD-10-CM

## 2016-01-14 DIAGNOSIS — E1129 Type 2 diabetes mellitus with other diabetic kidney complication: Secondary | ICD-10-CM | POA: Diagnosis not present

## 2016-01-14 DIAGNOSIS — A044 Other intestinal Escherichia coli infections: Secondary | ICD-10-CM | POA: Diagnosis present

## 2016-01-14 DIAGNOSIS — T8612 Kidney transplant failure: Secondary | ICD-10-CM | POA: Diagnosis present

## 2016-01-14 DIAGNOSIS — R197 Diarrhea, unspecified: Secondary | ICD-10-CM | POA: Diagnosis not present

## 2016-01-14 DIAGNOSIS — E1122 Type 2 diabetes mellitus with diabetic chronic kidney disease: Secondary | ICD-10-CM | POA: Diagnosis present

## 2016-01-14 DIAGNOSIS — Z8673 Personal history of transient ischemic attack (TIA), and cerebral infarction without residual deficits: Secondary | ICD-10-CM | POA: Diagnosis not present

## 2016-01-14 DIAGNOSIS — I953 Hypotension of hemodialysis: Secondary | ICD-10-CM | POA: Diagnosis not present

## 2016-01-14 DIAGNOSIS — Z880 Allergy status to penicillin: Secondary | ICD-10-CM | POA: Diagnosis not present

## 2016-01-14 LAB — CBC
HCT: 35.8 % — ABNORMAL LOW (ref 39.0–52.0)
Hemoglobin: 11.7 g/dL — ABNORMAL LOW (ref 13.0–17.0)
MCH: 31.9 pg (ref 26.0–34.0)
MCHC: 32.7 g/dL (ref 30.0–36.0)
MCV: 97.5 fL (ref 78.0–100.0)
PLATELETS: 208 10*3/uL (ref 150–400)
RBC: 3.67 MIL/uL — ABNORMAL LOW (ref 4.22–5.81)
RDW: 14 % (ref 11.5–15.5)
WBC: 5.6 10*3/uL (ref 4.0–10.5)

## 2016-01-14 LAB — GASTROINTESTINAL PANEL BY PCR, STOOL (REPLACES STOOL CULTURE)
ADENOVIRUS F40/41: NOT DETECTED
ASTROVIRUS: NOT DETECTED
Campylobacter species: NOT DETECTED
Cryptosporidium: NOT DETECTED
Cyclospora cayetanensis: NOT DETECTED
ENTAMOEBA HISTOLYTICA: NOT DETECTED
ENTEROAGGREGATIVE E COLI (EAEC): NOT DETECTED
ENTEROTOXIGENIC E COLI (ETEC): NOT DETECTED
Enteropathogenic E coli (EPEC): DETECTED — AB
GIARDIA LAMBLIA: NOT DETECTED
NOROVIRUS GI/GII: NOT DETECTED
Plesimonas shigelloides: NOT DETECTED
Rotavirus A: NOT DETECTED
Salmonella species: NOT DETECTED
Sapovirus (I, II, IV, and V): NOT DETECTED
Shiga like toxin producing E coli (STEC): NOT DETECTED
Shigella/Enteroinvasive E coli (EIEC): NOT DETECTED
VIBRIO CHOLERAE: NOT DETECTED
Vibrio species: NOT DETECTED
Yersinia enterocolitica: NOT DETECTED

## 2016-01-14 LAB — COMPREHENSIVE METABOLIC PANEL
ALT: 10 U/L — AB (ref 17–63)
AST: 13 U/L — AB (ref 15–41)
Albumin: 2.9 g/dL — ABNORMAL LOW (ref 3.5–5.0)
Alkaline Phosphatase: 72 U/L (ref 38–126)
Anion gap: 11 (ref 5–15)
BILIRUBIN TOTAL: 0.6 mg/dL (ref 0.3–1.2)
BUN: 38 mg/dL — ABNORMAL HIGH (ref 6–20)
CO2: 23 mmol/L (ref 22–32)
CREATININE: 6.76 mg/dL — AB (ref 0.61–1.24)
Calcium: 7.8 mg/dL — ABNORMAL LOW (ref 8.9–10.3)
Chloride: 104 mmol/L (ref 101–111)
GFR, EST AFRICAN AMERICAN: 8 mL/min — AB (ref >60)
GFR, EST NON AFRICAN AMERICAN: 7 mL/min — AB (ref >60)
Glucose, Bld: 89 mg/dL (ref 65–99)
Potassium: 3.7 mmol/L (ref 3.5–5.1)
Sodium: 138 mmol/L (ref 135–145)
TOTAL PROTEIN: 5.6 g/dL — AB (ref 6.5–8.1)

## 2016-01-14 LAB — GLUCOSE, CAPILLARY
Glucose-Capillary: 152 mg/dL — ABNORMAL HIGH (ref 65–99)
Glucose-Capillary: 205 mg/dL — ABNORMAL HIGH (ref 65–99)

## 2016-01-14 MED ORDER — VANCOMYCIN 50 MG/ML ORAL SOLUTION
ORAL | Status: AC
  Filled 2016-01-14: qty 10

## 2016-01-14 MED ORDER — CIPROFLOXACIN HCL 250 MG PO TABS
500.0000 mg | ORAL_TABLET | Freq: Two times a day (BID) | ORAL | Status: DC
  Administered 2016-01-14 – 2016-01-15 (×2): 500 mg via ORAL
  Filled 2016-01-14 (×2): qty 2

## 2016-01-14 MED ORDER — SODIUM CHLORIDE 0.9 % IV SOLN: 100.0000 mL | INTRAVENOUS | Status: DC | PRN

## 2016-01-14 MED ORDER — LIDOCAINE HCL (PF) 1 % IJ SOLN: 5.0000 mL | INTRAMUSCULAR | Status: DC | PRN

## 2016-01-14 MED ORDER — HEPARIN SODIUM (PORCINE) 1000 UNIT/ML DIALYSIS
1000.0000 [IU] | INTRAMUSCULAR | Status: DC | PRN
  Filled 2016-01-14: qty 1

## 2016-01-14 MED ORDER — LIDOCAINE-PRILOCAINE 2.5-2.5 % EX CREA: 1.0000 "application " | TOPICAL_CREAM | CUTANEOUS | Status: DC | PRN

## 2016-01-14 MED ORDER — ALTEPLASE 2 MG IJ SOLR
2.0000 mg | Freq: Once | INTRAMUSCULAR | Status: DC | PRN
  Filled 2016-01-14: qty 2

## 2016-01-14 MED ORDER — LOPERAMIDE HCL 2 MG PO CAPS
2.0000 mg | ORAL_CAPSULE | Freq: Three times a day (TID) | ORAL | Status: DC
  Administered 2016-01-14 – 2016-01-15 (×3): 2 mg via ORAL
  Filled 2016-01-14 (×4): qty 1

## 2016-01-14 MED ORDER — PENTAFLUOROPROP-TETRAFLUOROETH EX AERO: 1.0000 "application " | INHALATION_SPRAY | CUTANEOUS | Status: DC | PRN

## 2016-01-14 NOTE — Consult Note (Signed)
Referring Provider: No ref. provider found Primary Care Physician:  Neale Burly, MD Primary Gastroenterologist:  Dr. Oneida Alar  Date of Admission: 01/13/16 Date of Consultation: 01/14/16  Reason for Consultation:  Diarrhea, ? CDiff  HPI:  Wayne Hopkins is a 75 y.o. male with a past medical history of AFlutter, DM, ESRD, s/p kidney transplant and immunosuppression medications, CVA. The patient was seen in the emergency room and described loose, watery stools for the previous month for which primary care doctor evaluated him and told him he had "some kind of bacteria" and was given vancomycin 125 mg 4 times a day. He hasn't taken this since 01/07/2016 without change in his symptoms. No other significant associated GI symptoms. He has end-stage renal status post allograft transplant approximately 2005 with transplant kidney now essentially failed, being tapered down off immunosuppressants by transplant team at [facility]. His creatinine on presentation to the ER was 6.41. Sodium, potassium normal. Lipase normal. Lactic acid normal. CBC showed very mild anemia with a hemoglobin of 12.6, likely due to chronic kidney disease. CT of the abdomen and pelvis showed no definite signs of colitis. C. difficile quick scan was negative for antigen, negative for toxin with interpretation of no C. difficile detected. GI pathogen panel has been collected and is pending.  Today he states his diarrhea   Past Medical History:  Diagnosis Date  . Atrial flutter ([place])   . DDD (degenerative disc disease), lumbar   . Diabetes mellitus without complication ([place])   . ESRD (end stage renal disease) ([place])   . Hyperlipidemia   . Hypertension   . Long-term use of immunosuppressant medication   . Lumbar spondylosis   . Peripheral edema   . Stroke [facility])     Past Surgical History:  Procedure Laterality Date  . BACK SURGERY    . CHOLECYSTECTOMY    . KIDNEY TRANSPLANT  2005     Prior to Admission medications   Medication Sig Start Date End Date Taking? Authorizing Provider  aspirin EC 81 MG tablet Take 81 mg by mouth daily. 11/03/14  Yes Historical Provider, MD  Cholecalciferol (VITAMIN D-1000 MAX ST) 1000 units tablet Take 2 tablets by mouth daily. 05/24/15  Yes Historical Provider, MD  DOCOSAHEXAENOIC ACID PO Take 1,200 mg by mouth 2 (two) times daily.   Yes Historical Provider, MD  predniSONE (DELTASONE) 10 MG tablet Take 5 mg by mouth daily. 09/28/15  Yes Historical Provider, MD  sodium bicarbonate 650 MG tablet Take 1,300 mg by mouth 2 (two) times daily. 12/03/13  Yes Historical Provider, MD  vancomycin (VANCOCIN) 125 MG capsule Take 125 mg by mouth every 6 (six) hours. 01/04/16 01/14/16 Yes Historical Provider, MD    Current Facility-Administered Medications  Medication Dose Route Frequency Provider Last Rate Last Dose  . 0.9 %  sodium chloride infusion  100 mL Intravenous PRN Fran Lowes, MD      . 0.9 %  sodium chloride infusion  100 mL Intravenous PRN Fran Lowes, MD      . alteplase (CATHFLO ACTIVASE) injection 2 mg  2 mg Intracatheter Once PRN Fran Lowes, MD      . aspirin EC tablet 81 mg  81 mg Oral Daily Reubin Milan, MD   81 mg at 01/14/16 0956  . cholecalciferol (VITAMIN D) tablet 2,000 Units  2,000 Units Oral Daily Reubin Milan, MD   2,000 Units at 01/14/16 [phone]  . enoxaparin (LOVENOX) injection 30 mg  30 mg  Subcutaneous Q24H Reubin Milan, MD   30 mg at 01/13/16 2242  . heparin injection 1,000 Units  1,000 Units Dialysis PRN Fran Lowes, MD      . lidocaine (PF) (XYLOCAINE) 1 % injection 5 mL  5 mL Intradermal PRN Fran Lowes, MD      . lidocaine-prilocaine (EMLA) cream 1 application  1 application Topical PRN Fran Lowes, MD      . metroNIDAZOLE (FLAGYL) tablet 500 mg  500 mg Oral Q12H Reubin Milan, MD   500 mg at 01/14/16 0956  . ondansetron (ZOFRAN) tablet 4 mg  4 mg Oral Q6H PRN Reubin Milan, MD       Or  . ondansetron [facility]) injection 4 mg  4 mg Intravenous Q6H PRN Reubin Milan, MD      . pantoprazole (PROTONIX) EC tablet 40 mg  40 mg Oral Daily Reubin Milan, MD   40 mg at 01/14/16 0956  . pentafluoroprop-tetrafluoroeth (GEBAUERS) aerosol 1 application  1 application Topical PRN Fran Lowes, MD      . predniSONE (DELTASONE) tablet 5 mg  5 mg Oral Daily Reubin Milan, MD   5 mg at 01/14/16 0955  . sodium bicarbonate tablet 1,300 mg  1,300 mg Oral BID Reubin Milan, MD   1,300 mg at 01/14/16 0955  . vancomycin (VANCOCIN) 50 mg/mL oral solution 250 mg  250 mg Oral Q6H Reubin Milan, MD   250 mg at 01/14/16 2641    Allergies as of 01/13/2016 - Review Complete 01/13/2016  Allergen Reaction Noted  . Penicillins Swelling 01/13/2016  . Tape Other (See Comments) 01/13/2016    Family History  Problem Relation Age of Onset  . Stroke Mother   . Heart attack Father   . Kidney failure Sister   . Kidney failure Other     Social History   Social History  . Marital status: Married    Spouse name: N/A  . Number of children: N/A  . Years of education: N/A   Occupational History  . Not on file.   Social History Main Topics  . Smoking status: Former Research scientist (life sciences)  . Smokeless tobacco: Never Used  . Alcohol use No  . Drug use: No  . Sexual activity: Not on file   Other Topics Concern  . Not on file   Social History Narrative  . No narrative on file    Review of Systems: General: Negative for anorexia, weight loss, fever, chills, fatigue, weakness. Eyes: Negative for vision changes.  ENT: Negative for hoarseness, difficulty swallowing , nasal congestion. CV: Negative for chest pain, angina, palpitations, dyspnea on exertion, peripheral edema.  Respiratory: Negative for dyspnea at rest, dyspnea on exertion, cough, sputum, wheezing.  GI: See history of present illness. GU:  Negative for dysuria, hematuria, urinary incontinence,  urinary frequency, nocturnal urination.  MS: Negative for joint pain, low back pain.  Derm: Negative for rash or itching.  Neuro: Negative for weakness, abnormal sensation, seizure, frequent headaches, memory loss, confusion.  Psych: Negative for anxiety, depression, suicidal ideation, hallucinations.  Endo: Negative for unusual weight change.  Heme: Negative for bruising or bleeding. Allergy: Negative for rash or hives.  Physical Exam: Vital signs in last 24 hours: Temp:  [98 F (36.7 C)-99.2 F (37.3 C)] 98 F (36.7 C) (12/22 1305) Pulse Rate:  [41-102] 94 (12/22 1330) Resp:  [13-18] 16 (12/22 1305) BP: (90-149)/(40-72) 104/49 (12/22 1330) SpO2:  [97 %-100 %] 99 % (12/22 1305) Weight:  [  151 lb 7.3 oz (68.7 kg)-152 lb (68.9 kg)] 151 lb 7.3 oz (68.7 kg) (12/22 1305) Last BM Date: 01/14/16 General:   Alert,  Well-developed, well-nourished, pleasant and cooperative in NAD Head:  Normocephalic and atraumatic. Eyes:  Sclera clear, no icterus.   Conjunctiva pink. Ears:  Normal auditory acuity. Nose:  No deformity, discharge,  or lesions. Mouth:  No deformity or lesions, dentition normal. Neck:  Supple; no masses or thyromegaly. Lungs:  Clear throughout to auscultation.   No wheezes, crackles, or rhonchi. No acute distress. Heart:  Regular rate and rhythm; no murmurs, clicks, rubs,  or gallops. Abdomen:  Soft, nontender and nondistended. No masses, hepatosplenomegaly or hernias noted. Normal bowel sounds, without guarding, and without rebound.   Rectal:  Deferred until time of colonoscopy.   Msk:  Symmetrical without gross deformities. Normal posture. Pulses:  Normal pulses noted. Extremities:  Without clubbing or edema. Neurologic:  Alert and  oriented x4;  grossly normal neurologically. Skin:  Intact without significant lesions or rashes. Cervical Nodes:  No significant cervical adenopathy. Psych:  Alert and cooperative. Normal mood and affect.  Intake/Output from previous  day: 12/21 0701 - 12/22 0700 In: 437.5 [P.O.:120; I.V.:317.5] Out: -  Intake/Output this shift: No intake/output data recorded.  Lab Results:  Recent Labs  01/13/16 1304 01/13/16 2223 01/14/16 0412  WBC 6.9 6.2 5.6  HGB 12.6* 11.7* 11.7*  HCT 39.5 35.8* 35.8*  PLT 211 179 208   BMET  Recent Labs  01/13/16 1304 01/14/16 0412  NA 139 138  K 3.8 3.7  CL 99* 104  CO2 28 23  GLUCOSE 125* 89  BUN 35* 38*  CREATININE 6.41* 6.76*  CALCIUM 8.3* 7.8*   LFT  Recent Labs  01/13/16 1304 01/14/16 0412  PROT 6.6 5.6*  ALBUMIN 3.4* 2.9*  AST 14* 13*  ALT 11* 10*  ALKPHOS 79 72  BILITOT 0.5 0.6   PT/INR No results for input(s): LABPROT, INR in the last 72 hours. Hepatitis Panel No results for input(s): HEPBSAG, HCVAB, HEPAIGM, HEPBIGM in the last 72 hours. C-Diff  Recent Labs  01/13/16 1242  CDIFFTOX NEGATIVE    Studies/Results: Ct Abdomen Pelvis Wo Contrast  Result Date: 01/13/2016 CLINICAL DATA:  Diarrhea for several months. On antibiotic therapy for 9 days without improvement. History of end-stage renal disease, renal transplant and diabetes. EXAM: CT ABDOMEN AND PELVIS WITHOUT CONTRAST TECHNIQUE: Multidetector CT imaging of the abdomen and pelvis was performed following the standard protocol without IV contrast. COMPARISON:  CTs 01/01/2013. FINDINGS: Lower chest: Extensive coronary artery atherosclerosis noted. There is lesser atherosclerosis and tortuosity of the thoracic aorta. The lung bases are clear. There is no pleural effusion. Bilateral gynecomastia noted. Hepatobiliary: The liver appears unremarkable as imaged in the noncontrast state. No significant biliary dilatation post cholecystectomy. Pancreas: Atrophied with fatty replacement. No evidence of pancreatic mass, ductal dilatation or surrounding inflammation. Spleen: Normal in size without focal abnormality. Adrenals/Urinary Tract: The adrenal glands appear stable without suspicious findings. The native  kidneys are markedly atrophied with diffuse cortical thinning. There are enlarging low-density renal lesions bilaterally, more numerous on the right, probably all cysts based on attenuation. There is no hydronephrosis. Transplanted kidney in the right iliac fossa demonstrates a probable cyst in its lower pole, measuring up to 2.0 cm. No evidence of urinary tract calculus per there is mild perinephric soft tissue stranding around the transplanted kidney. Prominence of the bladder wall is probably related to incomplete distention. Stomach/Bowel: The stomach and small bowel demonstrate  no significant findings. The appendix appears normal. Allowing for incomplete colonic distension, there is no definite colonic wall thickening or surrounding inflammation. There is stable laxity of the anterior abdominal wall in the right lower quadrant, likely a broad-based incisional hernia. This appears unchanged. Vascular/Lymphatic: There are no enlarged abdominal or pelvic lymph nodes. Extensive aortic and branch vessel atherosclerosis. Reproductive: The prostate gland and seminal vesicles appear unremarkable. Calcifications of the vas deferens noted. Other: No ascites. As above, stable laxity of the anterior abdominal wall in the right lower quadrant, probably an incisional hernia. Musculoskeletal: No acute or significant osseous findings. Previous L4 through S1 fusion. IMPRESSION: 1. No definite signs of colitis. The colon is incompletely distended without gross wall thickening or surrounding inflammation. 2. Perinephric soft tissue stranding around the transplant kidney in the right iliac fossa, potentially inflammatory. No evidence of urinary tract calculus or hydronephrosis. Probable cyst in the lower pole of the transplanted kidney. 3. Chronic atrophy of the native kidneys with enlarging low-density lesions, likely cysts. 4. Probable chronic incisional hernia in the right lower quadrant. 5. Extensive atherosclerosis.  Electronically Signed   By: Richardean Sale M.D.   On: 01/13/2016 15:44    Impression: 63 -year-old gentleman with a complex medical history including renal failure, kidney transplant, eventual failure of the transplant now undergoing immunosuppressant taper down by the transplant team at [facility]. He has had  persistent watery diarrhea, history of positive C. difficile in mid December treated with vancomycin 4 times a day. He remains on this. He has had no improvement in his symptoms and his diarrhea is interfering with his ability to have ongoing dialysis due to frequent stool accidents and incontinence.   His C. difficile quick scan here is negative. At this point I doubt that he has ongoing C. difficile. GI pathogen panel is pending. Possible etiologies include other occult colon infection, postinfectious IBS, microscopic colitis, bile salt diarrnea, lactose intolerance (see [place] note) other more insidious pathology requiring endoscopic evaluation.  His labs are stable, being seen by nephrology. Currently has Imodium ordered. More chronic diarrhea, likely exacerbated by CDiff infection (now apparently cleared). Query lactose intolerance. S/P cholecystectomy.  Plan: 1. Stool sodium and potassium 24 hour collection 2. When stools have been collected, change to low fat diet; lactose free 3. When stools have been collected, start Colestid bid 4. Can use Immodium until then 5. Consider TCS next week if no improvement.   Thank you for allowing [place] to participate in the care of Araceli Bouche, DNP, AGNP-C Adult & Gerontological Nurse Practitioner [facility]    LOS: 0 days     01/14/2016, 1:55 PM

## 2016-01-14 NOTE — Care Management Note (Signed)
Case Management Note  Patient Details  Name: Wayne Hopkins MRN: [id number] Date of Birth: 04-03-1940  Subjective/Objective:                  Pt admitted for ?c-diff. Chart reviewed for CM needs. Pt is from home, lives with wife. He received OP HD three days a week MWF. He is ind with ADL's, has PCP, transportation and insurance with drug coverage. He plans to return home with self care.   Action/Plan: No CM needs anticipated.   Expected Discharge Date:  01/15/16               Expected Discharge Plan:  Home/Self Care  In-House Referral:  NA  Discharge planning Services  CM Consult  Post Acute Care Choice:  NA Choice offered to:  NA  Status of Service:  Completed, signed off  Sherald Barge, RN 01/14/2016, 2:26 PM

## 2016-01-14 NOTE — Care Management Obs Status (Signed)
MEDICARE OBSERVATION STATUS NOTIFICATION   Patient Details  Name: Wayne Hopkins MRN: [id number] Date of Birth: [birth year]   Medicare Observation Status Notification Given:  Yes    Sherald Barge, RN 01/14/2016, 2:23 PM

## 2016-01-14 NOTE — Procedures (Signed)
   HEMODIALYSIS TREATMENT NOTE:  3.5 hour heparin-free dialysis completed via right forearm AVF (16g/antegrade). Goal NOT met:  Unable to remove 2 liters due to hypotension.  Net UF 1.5 liters.  All blood returned.  Hemostasis in 10 minutes. Report called to Lilia Pro, Therapist, sports.  Rockwell Alexandria, RN, CDN

## 2016-01-14 NOTE — Progress Notes (Signed)
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 Patient reports that his diarrhea has been persistent and affecting his quality of life. He is unable to complete his dialysis sessions due to persistent loose stools. He was admitted to the hospital for further workup.   Assessment & Plan:   Principal Problem:   Clostridium difficile diarrhea Active Problems:   ESRD on dialysis ([place])   Anemia in chronic kidney disease   Hyperlipidemia   Essential hypertension   Type 2 diabetes mellitus ([place])   1. Persistent/chronic diarrhea. Recently treated for C. difficile as an outpatient. C. difficile on admission here was found to be negative. GI pathogen panel has returned positive for Escherichia coli. We'll start the patient on ciprofloxacin. GI has seen the patient and order further workup. If he fails to improve, may need colonoscopy.  2. End-stage renal disease on dialysis. Nephrology following.  3. Anemia of chronic kidney disease. Hemoglobin is currently stable. Continue to follow  4. Hyperlipidemia. Currently not on any medical treatment. Continue lifestyle modifications.  5. Diabetes. Stable. Continue sliding scale insulin.   DVT prophylaxis: lovenox Code Status: full code Family Communication: discussed with wife over the phone Disposition Plan: discharge home once improved   Consultants:   Gastroenterology  Procedures:     Antimicrobials:   cipro 12/22>>   Subjective: Continued diarrhea  Objective: Vitals:   01/14/16 1530 01/14/16 1600 01/14/16 1630 01/14/16 1653  BP: (!) 114/54 109/64 (!) 109/55 122/70  Pulse: 81 75 81 89  Resp:      Temp:      TempSrc:      SpO2:       Weight:      Height:        Intake/Output Summary (Last 24 hours) at 01/14/16 1908 Last data filed at 01/14/16 1640  Gross per 24 hour  Intake            437.5 ml  Output             1554 ml  Net          -1116.5 ml   Filed Weights   01/13/16 1142 01/13/16 1850 01/14/16 1305  Weight: 68.9 kg (152 lb) 68.9 kg (152 lb) 68.7 kg (151 lb 7.3 oz)    Examination:  General exam: Appears calm and comfortable  Respiratory system: Clear to auscultation. Respiratory effort normal. Cardiovascular system: S1 & S2 heard, RRR. No JVD, murmurs, rubs, gallops or clicks. No pedal edema. Gastrointestinal system: Abdomen is nondistended, soft and nontender. No organomegaly or masses felt. Normal bowel sounds heard. Incisional hernia in right lower quadrant, nontender Central nervous system: Alert and oriented. No focal neurological deficits. Extremities: Symmetric 5 x 5 power. Skin: No rashes, lesions or ulcers Psychiatry: Judgement and insight appear normal. Mood & affect appropriate.     Data Reviewed: I have personally reviewed following labs and imaging studies  CBC:  Recent Labs Lab 01/13/16 1304 01/13/16 2223 01/14/16 0412  WBC 6.9 6.2 5.6  NEUTROABS 4.0 3.3  --   HGB 12.6* 11.7* 11.7*  HCT 39.5 35.8* 35.8*  MCV 99.0 97.0 97.5  PLT 211 179 413   Basic Metabolic Panel:  Recent Labs Lab 01/13/16 1304 01/14/16 0412  NA 139 138  K 3.8 3.7  CL 99* 104  CO2 28 23  GLUCOSE 125* 89  BUN 35* 38*  CREATININE 6.41* 6.76*  CALCIUM 8.3* 7.8*   GFR: Estimated Creatinine Clearance: 9 mL/min (by C-G formula based on SCr of 6.76 mg/dL (H)). Liver Function Tests:  Recent Labs Lab 01/13/16 1304 01/14/16 0412  AST 14* 13*  ALT 11* 10*  ALKPHOS 79 72  BILITOT 0.5 0.6  PROT 6.6 5.6*  ALBUMIN 3.4* 2.9*    Recent Labs Lab 01/13/16 1304  LIPASE 26   No results for input(s): AMMONIA in the last 168 hours. Coagulation Profile: No results for input(s): INR, PROTIME in the  last 168 hours. Cardiac Enzymes: No results for input(s): CKTOTAL, CKMB, CKMBINDEX, TROPONINI in the last 168 hours. BNP (last 3 results) No results for input(s): PROBNP in the last 8760 hours. HbA1C: No results for input(s): HGBA1C in the last 72 hours. CBG:  Recent Labs Lab 01/13/16 2039 01/14/16 1756  GLUCAP 152* 152*   Lipid Profile: No results for input(s): CHOL, HDL, LDLCALC, TRIG, CHOLHDL, LDLDIRECT in the last 72 hours. Thyroid Function Tests: No results for input(s): TSH, T4TOTAL, FREET4, T3FREE, THYROIDAB in the last 72 hours. Anemia Panel: No results for input(s): VITAMINB12, FOLATE, FERRITIN, TIBC, IRON, RETICCTPCT in the last 72 hours. Sepsis Labs:  Recent Labs Lab 01/13/16 1304 01/13/16 1548  LATICACIDVEN 1.3 1.1    Recent Results (from the past 240 hour(s))  C difficile quick scan w PCR reflex     Status: None   Collection Time: 01/13/16 12:42 PM  Result Value Ref Range Status   C Diff antigen NEGATIVE NEGATIVE Final   C Diff toxin NEGATIVE NEGATIVE Final   C Diff interpretation No C. difficile detected.  Final  Gastrointestinal Panel by PCR , Stool     Status: Abnormal   Collection Time: 01/13/16  4:20 PM  Result Value Ref Range Status   Campylobacter species NOT DETECTED NOT DETECTED Final   Plesimonas shigelloides NOT DETECTED NOT DETECTED Final   Salmonella species NOT DETECTED NOT DETECTED Final   Yersinia enterocolitica NOT DETECTED NOT DETECTED Final   Vibrio species NOT DETECTED NOT DETECTED Final   Vibrio cholerae NOT DETECTED NOT DETECTED Final   Enteroaggregative E coli (EAEC) NOT DETECTED NOT DETECTED Final   Enteropathogenic E coli (EPEC) DETECTED (A) NOT DETECTED Final    Comment: RESULT CALLED TO, READ BACK BY AND VERIFIED WITH: MORGAN DISHMON 01/14/16 @ 1624  MLK    Enterotoxigenic E coli (ETEC) NOT DETECTED NOT DETECTED Final   Shiga like toxin producing E coli (STEC) NOT DETECTED NOT DETECTED Final   Shigella/Enteroinvasive E coli  (EIEC) NOT DETECTED NOT DETECTED Final   Cryptosporidium NOT DETECTED NOT DETECTED Final   Cyclospora cayetanensis NOT DETECTED NOT DETECTED Final   Entamoeba histolytica NOT DETECTED NOT DETECTED Final   Giardia lamblia NOT DETECTED NOT DETECTED Final   Adenovirus F40/41 NOT DETECTED NOT DETECTED Final   Astrovirus NOT DETECTED NOT DETECTED Final   Norovirus GI/GII NOT DETECTED NOT DETECTED Final   Rotavirus A NOT DETECTED NOT DETECTED Final   Sapovirus (I, II, IV, and V) NOT DETECTED NOT DETECTED Final         Radiology Studies: Ct Abdomen Pelvis Wo Contrast  Result Date: 01/13/2016 CLINICAL DATA:  Diarrhea for several months. On antibiotic therapy for 9 days without improvement. History of end-stage renal disease, renal transplant and diabetes. EXAM: CT ABDOMEN AND PELVIS WITHOUT CONTRAST TECHNIQUE:  Multidetector CT imaging of the abdomen and pelvis was performed following the standard protocol without IV contrast. COMPARISON:  CTs 01/01/2013. FINDINGS: Lower chest: Extensive coronary artery atherosclerosis noted. There is lesser atherosclerosis and tortuosity of the thoracic aorta. The lung bases are clear. There is no pleural effusion. Bilateral gynecomastia noted. Hepatobiliary: The liver appears unremarkable as imaged in the noncontrast state. No significant biliary dilatation post cholecystectomy. Pancreas: Atrophied with fatty replacement. No evidence of pancreatic mass, ductal dilatation or surrounding inflammation. Spleen: Normal in size without focal abnormality. Adrenals/Urinary Tract: The adrenal glands appear stable without suspicious findings. The native kidneys are markedly atrophied with diffuse cortical thinning. There are enlarging low-density renal lesions bilaterally, more numerous on the right, probably all cysts based on attenuation. There is no hydronephrosis. Transplanted kidney in the right iliac fossa demonstrates a probable cyst in its lower pole, measuring up to 2.0  cm. No evidence of urinary tract calculus per there is mild perinephric soft tissue stranding around the transplanted kidney. Prominence of the bladder wall is probably related to incomplete distention. Stomach/Bowel: The stomach and small bowel demonstrate no significant findings. The appendix appears normal. Allowing for incomplete colonic distension, there is no definite colonic wall thickening or surrounding inflammation. There is stable laxity of the anterior abdominal wall in the right lower quadrant, likely a broad-based incisional hernia. This appears unchanged. Vascular/Lymphatic: There are no enlarged abdominal or pelvic lymph nodes. Extensive aortic and branch vessel atherosclerosis. Reproductive: The prostate gland and seminal vesicles appear unremarkable. Calcifications of the vas deferens noted. Other: No ascites. As above, stable laxity of the anterior abdominal wall in the right lower quadrant, probably an incisional hernia. Musculoskeletal: No acute or significant osseous findings. Previous L4 through S1 fusion. IMPRESSION: 1. No definite signs of colitis. The colon is incompletely distended without gross wall thickening or surrounding inflammation. 2. Perinephric soft tissue stranding around the transplant kidney in the right iliac fossa, potentially inflammatory. No evidence of urinary tract calculus or hydronephrosis. Probable cyst in the lower pole of the transplanted kidney. 3. Chronic atrophy of the native kidneys with enlarging low-density lesions, likely cysts. 4. Probable chronic incisional hernia in the right lower quadrant. 5. Extensive atherosclerosis. Electronically Signed   By: Richardean Sale M.D.   On: 01/13/2016 15:44        Scheduled Meds: . aspirin EC  81 mg Oral Daily  . cholecalciferol  2,000 Units Oral Daily  . ciprofloxacin  500 mg Oral BID  . enoxaparin (LOVENOX) injection  30 mg Subcutaneous Q24H  . loperamide  2 mg Oral TID  . pantoprazole  40 mg Oral Daily  .  predniSONE  5 mg Oral Daily  . sodium bicarbonate  1,300 mg Oral BID   Continuous Infusions:   LOS: 0 days    Time spent: 67mins    Gerrick Ray, MD [facility] Pager [phone]  If 7PM-7AM, please contact night-coverage [email or web address] Password [email or web address] 01/14/2016, 7:08 PM

## 2016-01-14 NOTE — Consult Note (Signed)
Reason for Consult:ESRD Referring Physician: Dr. Ricke Hey Wayne Hopkins is an 75 y.o. male.  HPI: He is a patient who has history of hypertension, diabetes, status post failed kidney transplant presently on hemodialysis came with complaints of diarrhea/incontinence for the last couple of months. Patient was having loose bowel movement for some time. Initially was thought to be because of incontinence however recently the frequency has increased and made it difficult for patient to complete his dialysis treatment. According to patient he was seen by his primary care physician and was told to have C. difficile and put on antibiotics. Patient however continued to have problems with diarrhea and he sent to emergency room. Patient is feeling somewhat better. He denies any fever or chills or sweating.  Past Medical History:  Diagnosis Date  . Atrial flutter ([place])   . DDD (degenerative disc disease), lumbar   . Diabetes mellitus without complication ([place])   . ESRD (end stage renal disease) ([place])   . Hyperlipidemia   . Hypertension   . Long-term use of immunosuppressant medication   . Lumbar spondylosis   . Peripheral edema   . Stroke [facility])     Past Surgical History:  Procedure Laterality Date  . BACK SURGERY    . CHOLECYSTECTOMY    . KIDNEY TRANSPLANT  2005    Family History  Problem Relation Age of Onset  . Stroke Mother   . Heart attack Father   . Kidney failure Sister   . Kidney failure Other     Social History:  reports that he has quit smoking. He has never used smokeless tobacco. He reports that he does not drink alcohol or use drugs.  Allergies:  Allergies  Allergen Reactions  . Penicillins Swelling  . Tape Other (See Comments)    Pt states he cannot use plastic tape, tears his skin    Medications: I have reviewed the patient's current medications.  Results for orders placed or performed during the hospital encounter of 01/13/16 (from the past 48 hour(s))  C difficile  quick scan w PCR reflex     Status: None   Collection Time: 01/13/16 12:42 PM  Result Value Ref Range   C Diff antigen NEGATIVE NEGATIVE   C Diff toxin NEGATIVE NEGATIVE   C Diff interpretation No C. difficile detected.   Comprehensive metabolic panel     Status: Abnormal   Collection Time: 01/13/16  1:04 PM  Result Value Ref Range   Sodium 139 135 - 145 mmol/L   Potassium 3.8 3.5 - 5.1 mmol/L   Chloride 99 (L) 101 - 111 mmol/L   CO2 28 22 - 32 mmol/L   Glucose, Bld 125 (H) 65 - 99 mg/dL   BUN 35 (H) 6 - 20 mg/dL   Creatinine, Ser 6.41 (H) 0.61 - 1.24 mg/dL   Calcium 8.3 (L) 8.9 - 10.3 mg/dL   Total Protein 6.6 6.5 - 8.1 g/dL   Albumin 3.4 (L) 3.5 - 5.0 g/dL   AST 14 (L) 15 - 41 U/L   ALT 11 (L) 17 - 63 U/L   Alkaline Phosphatase 79 38 - 126 U/L   Total Bilirubin 0.5 0.3 - 1.2 mg/dL   GFR calc non Af Amer 8 (L) >60 mL/min   GFR calc Af Amer 9 (L) >60 mL/min    Comment: (NOTE) The eGFR has been calculated using the CKD EPI equation. This calculation has not been validated in all clinical situations. eGFR's persistently <60 mL/min signify possible Chronic  Kidney Disease.    Anion gap 12 5 - 15  Lipase, blood     Status: None   Collection Time: 01/13/16  1:04 PM  Result Value Ref Range   Lipase 26 11 - 51 U/L  Lactic acid, plasma     Status: None   Collection Time: 01/13/16  1:04 PM  Result Value Ref Range   Lactic Acid, Venous 1.3 0.5 - 1.9 mmol/L  CBC with Differential     Status: Abnormal   Collection Time: 01/13/16  1:04 PM  Result Value Ref Range   WBC 6.9 4.0 - 10.5 K/uL   RBC 3.99 (L) 4.22 - 5.81 MIL/uL   Hemoglobin 12.6 (L) 13.0 - 17.0 g/dL   HCT 39.5 39.0 - 52.0 %   MCV 99.0 78.0 - 100.0 fL   MCH 31.6 26.0 - 34.0 pg   MCHC 31.9 30.0 - 36.0 g/dL   RDW 14.4 11.5 - 15.5 %   Platelets 211 150 - 400 K/uL   Neutrophils Relative % 57 %   Neutro Abs 4.0 1.7 - 7.7 K/uL   Lymphocytes Relative 28 %   Lymphs Abs 1.9 0.7 - 4.0 K/uL   Monocytes Relative 10 %    Monocytes Absolute 0.7 0.1 - 1.0 K/uL   Eosinophils Relative 4 %   Eosinophils Absolute 0.3 0.0 - 0.7 K/uL   Basophils Relative 1 %   Basophils Absolute 0.0 0.0 - 0.1 K/uL  Lactic acid, plasma     Status: None   Collection Time: 01/13/16  3:48 PM  Result Value Ref Range   Lactic Acid, Venous 1.1 0.5 - 1.9 mmol/L  Glucose, capillary     Status: Abnormal   Collection Time: 01/13/16  8:39 PM  Result Value Ref Range   Glucose-Capillary 152 (H) 65 - 99 mg/dL   Comment 1 Notify RN    Comment 2 Document in Chart   CBC WITH DIFFERENTIAL     Status: Abnormal   Collection Time: 01/13/16 10:23 PM  Result Value Ref Range   WBC 6.2 4.0 - 10.5 K/uL   RBC 3.69 (L) 4.22 - 5.81 MIL/uL   Hemoglobin 11.7 (L) 13.0 - 17.0 g/dL   HCT 35.8 (L) 39.0 - 52.0 %   MCV 97.0 78.0 - 100.0 fL   MCH 31.7 26.0 - 34.0 pg   MCHC 32.7 30.0 - 36.0 g/dL   RDW 14.0 11.5 - 15.5 %   Platelets 179 150 - 400 K/uL   Neutrophils Relative % 53 %   Neutro Abs 3.3 1.7 - 7.7 K/uL   Lymphocytes Relative 31 %   Lymphs Abs 1.9 0.7 - 4.0 K/uL   Monocytes Relative 10 %   Monocytes Absolute 0.6 0.1 - 1.0 K/uL   Eosinophils Relative 5 %   Eosinophils Absolute 0.3 0.0 - 0.7 K/uL   Basophils Relative 1 %   Basophils Absolute 0.0 0.0 - 0.1 K/uL  Comprehensive metabolic panel     Status: Abnormal   Collection Time: 01/14/16  4:12 AM  Result Value Ref Range   Sodium 138 135 - 145 mmol/L   Potassium 3.7 3.5 - 5.1 mmol/L   Chloride 104 101 - 111 mmol/L   CO2 23 22 - 32 mmol/L   Glucose, Bld 89 65 - 99 mg/dL   BUN 38 (H) 6 - 20 mg/dL   Creatinine, Ser 6.76 (H) 0.61 - 1.24 mg/dL   Calcium 7.8 (L) 8.9 - 10.3 mg/dL   Total Protein 5.6 (L) 6.5 -  8.1 g/dL   Albumin 2.9 (L) 3.5 - 5.0 g/dL   AST 13 (L) 15 - 41 U/L   ALT 10 (L) 17 - 63 U/L   Alkaline Phosphatase 72 38 - 126 U/L   Total Bilirubin 0.6 0.3 - 1.2 mg/dL   GFR calc non Af Amer 7 (L) >60 mL/min   GFR calc Af Amer 8 (L) >60 mL/min    Comment: (NOTE) The eGFR has been  calculated using the CKD EPI equation. This calculation has not been validated in all clinical situations. eGFR's persistently <60 mL/min signify possible Chronic Kidney Disease.    Anion gap 11 5 - 15  CBC     Status: Abnormal   Collection Time: 01/14/16  4:12 AM  Result Value Ref Range   WBC 5.6 4.0 - 10.5 K/uL   RBC 3.67 (L) 4.22 - 5.81 MIL/uL   Hemoglobin 11.7 (L) 13.0 - 17.0 g/dL   HCT 95.7 (L) 47.3 - 40.3 %   MCV 97.5 78.0 - 100.0 fL   MCH 31.9 26.0 - 34.0 pg   MCHC 32.7 30.0 - 36.0 g/dL   RDW 70.9 64.3 - 83.8 %   Platelets 208 150 - 400 K/uL    Ct Abdomen Pelvis Wo Contrast  Result Date: 01/13/2016 CLINICAL DATA:  Diarrhea for several months. On antibiotic therapy for 9 days without improvement. History of end-stage renal disease, renal transplant and diabetes. EXAM: CT ABDOMEN AND PELVIS WITHOUT CONTRAST TECHNIQUE: Multidetector CT imaging of the abdomen and pelvis was performed following the standard protocol without IV contrast. COMPARISON:  CTs 01/01/2013. FINDINGS: Lower chest: Extensive coronary artery atherosclerosis noted. There is lesser atherosclerosis and tortuosity of the thoracic aorta. The lung bases are clear. There is no pleural effusion. Bilateral gynecomastia noted. Hepatobiliary: The liver appears unremarkable as imaged in the noncontrast state. No significant biliary dilatation post cholecystectomy. Pancreas: Atrophied with fatty replacement. No evidence of pancreatic mass, ductal dilatation or surrounding inflammation. Spleen: Normal in size without focal abnormality. Adrenals/Urinary Tract: The adrenal glands appear stable without suspicious findings. The native kidneys are markedly atrophied with diffuse cortical thinning. There are enlarging low-density renal lesions bilaterally, more numerous on the right, probably all cysts based on attenuation. There is no hydronephrosis. Transplanted kidney in the right iliac fossa demonstrates a probable cyst in its lower  pole, measuring up to 2.0 cm. No evidence of urinary tract calculus per there is mild perinephric soft tissue stranding around the transplanted kidney. Prominence of the bladder wall is probably related to incomplete distention. Stomach/Bowel: The stomach and small bowel demonstrate no significant findings. The appendix appears normal. Allowing for incomplete colonic distension, there is no definite colonic wall thickening or surrounding inflammation. There is stable laxity of the anterior abdominal wall in the right lower quadrant, likely a broad-based incisional hernia. This appears unchanged. Vascular/Lymphatic: There are no enlarged abdominal or pelvic lymph nodes. Extensive aortic and branch vessel atherosclerosis. Reproductive: The prostate gland and seminal vesicles appear unremarkable. Calcifications of the vas deferens noted. Other: No ascites. As above, stable laxity of the anterior abdominal wall in the right lower quadrant, probably an incisional hernia. Musculoskeletal: No acute or significant osseous findings. Previous L4 through S1 fusion. IMPRESSION: 1. No definite signs of colitis. The colon is incompletely distended without gross wall thickening or surrounding inflammation. 2. Perinephric soft tissue stranding around the transplant kidney in the right iliac fossa, potentially inflammatory. No evidence of urinary tract calculus or hydronephrosis. Probable cyst in the lower pole of  the transplanted kidney. 3. Chronic atrophy of the native kidneys with enlarging low-density lesions, likely cysts. 4. Probable chronic incisional hernia in the right lower quadrant. 5. Extensive atherosclerosis. Electronically Signed   By: Richardean Sale M.D.   On: 01/13/2016 15:44    Review of Systems  Constitutional: Positive for malaise/fatigue. Negative for chills and fever.  Respiratory: Negative for cough and shortness of breath.   Cardiovascular: Negative for orthopnea and claudication.  Gastrointestinal:  Positive for diarrhea. Negative for abdominal pain, nausea and vomiting.  Neurological: Positive for weakness.   Blood pressure (!) 90/52, pulse 95, temperature 98 F (36.7 C), temperature source Oral, resp. rate 18, height '5\' 10"'$  (1.778 m), weight 68.9 kg (152 lb), SpO2 98 %. Physical Exam  Constitutional: He is oriented to person, place, and time.  HENT:  Patient with diabetic retinopathy and impaired vision  Eyes: No scleral icterus.  Neck: No JVD present.  Cardiovascular: Normal rate and regular rhythm.   Respiratory: No respiratory distress. He has no wheezes.  GI: He exhibits no distension. There is no tenderness. There is no rebound.  Musculoskeletal: He exhibits no edema.  Neurological: He is alert and oriented to person, place, and time.    Assessment/Plan: Problem #1 chronic diarrhea: Etiology at this moment is no clear. Patient seems to have possible incontinence/irritable bowel syndrome. Patient cannot control his bowels and most of the times for his cloth  even when he is on dialysis. His stools presently is negative for C. difficile. Patient also denies any fever chills or sweating. Problem #2 end-stage renal disease: His status post hemodialysis on Wednesday. Presently his potassium is good and he doesn't have any uremic sinus symptoms. Problem #3 hypertension: His blood pressure is reasonably controlled Problem #4 history of diabetes Problem #5 history of failed kidney transplant: His immunotherapy has been weaned off by [facility] doctors at [facility]. Problem #6 metabolic bone disease: His calcium is range Problem #7 anemia: His hemoglobin is within our target goal. Plan: We will make arrangements for patient to get dialysis today We'll remove about 2 L if systolic blood pressures above 90 We'll continue his other medications as before.  La Shehan S 01/14/2016, 9:08 AM

## 2016-01-15 DIAGNOSIS — R197 Diarrhea, unspecified: Secondary | ICD-10-CM

## 2016-01-15 LAB — CBC
HCT: 36.4 % — ABNORMAL LOW (ref 39.0–52.0)
HEMOGLOBIN: 11.9 g/dL — AB (ref 13.0–17.0)
MCH: 31.8 pg (ref 26.0–34.0)
MCHC: 32.7 g/dL (ref 30.0–36.0)
MCV: 97.3 fL (ref 78.0–100.0)
Platelets: 194 10*3/uL (ref 150–400)
RBC: 3.74 MIL/uL — AB (ref 4.22–5.81)
RDW: 13.9 % (ref 11.5–15.5)
WBC: 5.1 10*3/uL (ref 4.0–10.5)

## 2016-01-15 LAB — RENAL FUNCTION PANEL
ANION GAP: 9 (ref 5–15)
Albumin: 2.8 g/dL — ABNORMAL LOW (ref 3.5–5.0)
BUN: 28 mg/dL — ABNORMAL HIGH (ref 6–20)
CALCIUM: 7.8 mg/dL — AB (ref 8.9–10.3)
CHLORIDE: 101 mmol/L (ref 101–111)
CO2: 29 mmol/L (ref 22–32)
Creatinine, Ser: 5.28 mg/dL — ABNORMAL HIGH (ref 0.61–1.24)
GFR calc non Af Amer: 9 mL/min — ABNORMAL LOW (ref >60)
GFR, EST AFRICAN AMERICAN: 11 mL/min — AB (ref >60)
GLUCOSE: 125 mg/dL — AB (ref 65–99)
POTASSIUM: 3.6 mmol/L (ref 3.5–5.1)
Phosphorus: 5.8 mg/dL — ABNORMAL HIGH (ref 2.5–4.6)
SODIUM: 139 mmol/L (ref 135–145)

## 2016-01-15 LAB — HEPATITIS B SURFACE ANTIGEN: Hepatitis B Surface Ag: NEGATIVE

## 2016-01-15 LAB — GLUCOSE, CAPILLARY
GLUCOSE-CAPILLARY: 126 mg/dL — AB (ref 65–99)
Glucose-Capillary: 135 mg/dL — ABNORMAL HIGH (ref 65–99)

## 2016-01-15 MED ORDER — COLESTIPOL HCL 1 G PO TABS
1.0000 g | ORAL_TABLET | Freq: Two times a day (BID) | ORAL | Status: DC
  Administered 2016-01-15: 1 g via ORAL
  Filled 2016-01-15 (×5): qty 1

## 2016-01-15 MED ORDER — COLESTIPOL HCL 1 G PO TABS: 1.0000 g | ORAL_TABLET | Freq: Two times a day (BID) | ORAL | 0 refills | Status: DC

## 2016-01-15 MED ORDER — PANTOPRAZOLE SODIUM 40 MG PO TBEC: 40.0000 mg | DELAYED_RELEASE_TABLET | Freq: Every day | ORAL | Status: DC

## 2016-01-15 MED ORDER — LOPERAMIDE HCL 2 MG PO TABS: 2.0000 mg | ORAL_TABLET | Freq: Four times a day (QID) | ORAL | 0 refills | Status: DC | PRN

## 2016-01-15 MED ORDER — ACETAMINOPHEN 325 MG PO TABS
650.0000 mg | ORAL_TABLET | Freq: Four times a day (QID) | ORAL | Status: DC | PRN
  Administered 2016-01-15: 650 mg via ORAL
  Filled 2016-01-15: qty 2

## 2016-01-15 NOTE — Progress Notes (Signed)
1164: DISCUSSED WITH DR. VAN DAM. NO NEED TO TREAT WITH CIPRO UNLESS PT IS TOXIC. WOULD AVOID CIPRO AND TREAT WITH BACTRIM FOR 2 DAYS IF NEEDED. PT AFEBRILE, AND HEMODYNAMICALLY STABLE. D/C CIPRO. SUPPORTIVE CARE.

## 2016-01-15 NOTE — Discharge Summary (Signed)
Physician Discharge Summary  Wayne Hopkins:[id number] DOB: Mar 03, 1940 DOA: 01/13/2016  PCP: Neale Burly, MD  Admit date: 01/13/2016 Discharge date: 01/15/2016  Admitted From: home Disposition:  home  Recommendations for Outpatient Follow-up:  1. Follow up with PCP in 1-2 weeks 2. Please obtain BMP/CBC in one week 3. 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 Patient reports that his diarrhea has been persistent and affecting his quality of life. He is unable to complete his dialysis sessions due to persistent loose stools. He was admitted to the hospital for further workup  Discharge Diagnoses:  Active Problems:   Diarrhea   ESRD on dialysis ([facility])   Anemia in chronic kidney disease   Hyperlipidemia   Essential hypertension   Type 2 diabetes mellitus ([place])  1. Persistent/chronic diarrhea. Recently treated for C. difficile as an outpatient. C. difficile on admission was found to be negative. GI pathogen panel returned positive for Escherichia coli. Discussed with ID and recommendations were for supportive treatment. Since the patient was improving, no abx ordered. He was started on imodium with improvement and resolution of loose stools. Started on diet and tolerated it. Seen by GI who ordered a variety of stool studies. He will plan to follow up with GI as an outpatient. He is feeling significantly improved and is requesting discharge home today.  2. End-stage renal disease on dialysis. Nephrology following.  3. Anemia of chronic kidney disease. Hemoglobin is currently stable. Continue to follow  4.  Hyperlipidemia. Currently not on any medical treatment. Continue lifestyle modifications.  5. Diabetes. Stable. Continue sliding scale insulin.  Discharge Instructions  Discharge Instructions    Diet - low sodium heart healthy    Complete by:  As directed    Increase activity slowly    Complete by:  As directed      Allergies as of 01/15/2016      Reactions   Penicillins Swelling   Tape Other (See Comments)   Pt states he cannot use plastic tape, tears his skin      Medication List    STOP taking these medications   vancomycin 125 MG capsule Commonly known as:  VANCOCIN     TAKE these medications   aspirin EC 81 MG tablet Take 81 mg by mouth daily.   colestipol 1 g tablet Commonly known as:  COLESTID Take 1 tablet (1 g total) by mouth 2 (two) times daily.   DOCOSAHEXAENOIC ACID PO Take 1,200 mg by mouth 2 (two) times daily.   loperamide 2 MG tablet Commonly known as:  IMODIUM A-D Take 1 tablet (2 mg total) by mouth 4 (four) times daily as needed for diarrhea or loose stools.   predniSONE 10 MG tablet Commonly known as:  DELTASONE Take 5 mg by mouth daily.   sodium bicarbonate 650 MG tablet Take 1,300 mg by mouth 2 (two) times daily.   VITAMIN D-1000 MAX ST 1000 units tablet Generic drug:  Cholecalciferol Take 2 tablets by mouth daily.      Follow-up Information    Barney Drain, MD Follow up.   Specialty:  Gastroenterology Why:  call on monday for follow up appointment. avoid lactose containing foods and dairy products. continue  on low fat diet.  Contact information: [place] [address] [phone]          Allergies  Allergen Reactions  . Penicillins Swelling  . Tape Other (See Comments)    Pt states he cannot use plastic tape, tears his skin    Consultations:  Nephrology  Gastroenterology   Procedures/Studies: Ct Abdomen Pelvis Wo Contrast  Result Date: 01/13/2016 CLINICAL DATA:  Diarrhea for several months.  On antibiotic therapy for 9 days without improvement. History of end-stage renal disease, renal transplant and diabetes. EXAM: CT ABDOMEN AND PELVIS WITHOUT CONTRAST TECHNIQUE: Multidetector CT imaging of the abdomen and pelvis was performed following the standard protocol without IV contrast. COMPARISON:  CTs 01/01/2013. FINDINGS: Lower chest: Extensive coronary artery atherosclerosis noted. There is lesser atherosclerosis and tortuosity of the thoracic aorta. The lung bases are clear. There is no pleural effusion. Bilateral gynecomastia noted. Hepatobiliary: The liver appears unremarkable as imaged in the noncontrast state. No significant biliary dilatation post cholecystectomy. Pancreas: Atrophied with fatty replacement. No evidence of pancreatic mass, ductal dilatation or surrounding inflammation. Spleen: Normal in size without focal abnormality. Adrenals/Urinary Tract: The adrenal glands appear stable without suspicious findings. The native kidneys are markedly atrophied with diffuse cortical thinning. There are enlarging low-density renal lesions bilaterally, more numerous on the right, probably all cysts based on attenuation. There is no hydronephrosis. Transplanted kidney in the right iliac fossa demonstrates a probable cyst in its lower pole, measuring up to 2.0 cm. No evidence of urinary tract calculus per there is mild perinephric soft tissue stranding around the transplanted kidney. Prominence of the bladder wall is probably related to incomplete distention. Stomach/Bowel: The stomach and small bowel demonstrate no significant findings. The appendix appears normal. Allowing for incomplete colonic distension, there is no definite colonic wall thickening or surrounding inflammation. There is stable laxity of the anterior abdominal wall in the right lower quadrant, likely a broad-based incisional hernia. This appears unchanged. Vascular/Lymphatic: There are no enlarged abdominal or pelvic lymph nodes.  Extensive aortic and branch vessel atherosclerosis. Reproductive: The prostate gland and seminal vesicles appear unremarkable. Calcifications of the vas deferens noted. Other: No ascites. As above, stable laxity of the anterior abdominal wall in the right lower quadrant, probably an incisional hernia. Musculoskeletal: No acute or significant osseous findings. Previous L4 through S1 fusion. IMPRESSION: 1. No definite signs of colitis. The colon is incompletely distended without gross wall thickening or surrounding inflammation. 2. Perinephric soft tissue stranding around the transplant kidney in the right iliac fossa, potentially inflammatory. No evidence of urinary tract calculus or hydronephrosis. Probable cyst in the lower pole of the transplanted kidney. 3. Chronic atrophy of the native kidneys with enlarging low-density lesions, likely cysts. 4. Probable chronic incisional hernia in the right lower quadrant. 5. Extensive atherosclerosis. Electronically Signed   By: Richardean Sale M.D.   On: 01/13/2016 15:44      Subjective: Feeling better. No diarrhea since yesterday. Tolerating diet without recurrence of diarrhea  Discharge Exam: Vitals:   01/15/16 0657 01/15/16 1402  BP: (!) 120/59 116/69  Pulse: 66 77  Resp: 18 16  Temp: 98.2 F (36.8 C) 98.2 F (36.8 C)   Vitals:   01/14/16 1653 01/14/16 2055 01/15/16 0657 01/15/16 1402  BP: 122/70 (!) 126/55 (!) 120/59 116/69  Pulse: 89 67 66 77  Resp:  18 18 16   Temp:  98.5 F (36.9 C) 98.2 F (36.8 C) 98.2 F (36.8 C)  TempSrc:  Oral Oral Oral  SpO2:  99% 96% 97%  Weight:   69.6 kg (153 lb 8 oz)   Height:        General: Pt is alert, awake, not in acute distress Cardiovascular: RRR, S1/S2 +, no rubs, no gallops Respiratory: CTA bilaterally, no wheezing, no rhonchi Abdominal: Soft, NT, ND, bowel sounds + Extremities: no edema, no cyanosis    The results of significant diagnostics from this hospitalization (including imaging,  microbiology, ancillary and laboratory) are listed below for reference.     Microbiology: Recent Results (from the past 240 hour(s))  C difficile quick scan w PCR reflex     Status: None   Collection Time: 01/13/16 12:42 PM  Result Value Ref Range Status   C Diff antigen NEGATIVE NEGATIVE Final   C Diff toxin NEGATIVE NEGATIVE Final   C Diff interpretation No C. difficile detected.  Final  Gastrointestinal Panel by PCR , Stool     Status: Abnormal   Collection Time: 01/13/16  4:20 PM  Result Value Ref Range Status   Campylobacter species NOT DETECTED NOT DETECTED Final   Plesimonas shigelloides NOT DETECTED NOT DETECTED Final   Salmonella species NOT DETECTED NOT DETECTED Final   Yersinia enterocolitica NOT DETECTED NOT DETECTED Final   Vibrio species NOT DETECTED NOT DETECTED Final   Vibrio cholerae NOT DETECTED NOT DETECTED Final   Enteroaggregative E coli (EAEC) NOT DETECTED NOT DETECTED Final   Enteropathogenic E coli (EPEC) DETECTED (A) NOT DETECTED Final    Comment: RESULT CALLED TO, READ BACK BY AND VERIFIED WITH: MORGAN DISHMON 01/14/16 @ 1624  MLK    Enterotoxigenic E coli (ETEC) NOT DETECTED NOT DETECTED Final   Shiga like toxin producing E coli (STEC) NOT DETECTED NOT DETECTED Final   Shigella/Enteroinvasive E coli (EIEC) NOT DETECTED NOT DETECTED Final   Cryptosporidium NOT DETECTED NOT DETECTED Final   Cyclospora cayetanensis NOT DETECTED NOT DETECTED Final   Entamoeba histolytica NOT DETECTED NOT DETECTED Final   Giardia lamblia NOT DETECTED NOT DETECTED Final   Adenovirus F40/41 NOT DETECTED NOT DETECTED Final   Astrovirus NOT DETECTED NOT DETECTED Final   Norovirus GI/GII NOT DETECTED NOT DETECTED Final   Rotavirus A NOT DETECTED NOT DETECTED Final   Sapovirus (I, II, IV, and V) NOT DETECTED NOT DETECTED Final     Labs: BNP (last 3 results) No results for input(s): BNP in the last 8760 hours. Basic Metabolic Panel:  Recent Labs Lab 01/13/16 1304  01/14/16 0412 01/15/16 0601  NA 139 138 139  K 3.8 3.7 3.6  CL 99* 104 101  CO2 28 23 29   GLUCOSE 125* 89 125*  BUN 35* 38* 28*  CREATININE 6.41* 6.76* 5.28*  CALCIUM 8.3* 7.8* 7.8*  PHOS  --   --  5.8*   Liver Function Tests:  Recent Labs Lab 01/13/16 1304 01/14/16 0412 01/15/16 0601  AST 14* 13*  --   ALT 11* 10*  --   ALKPHOS 79 72  --   BILITOT 0.5 0.6  --   PROT 6.6 5.6*  --   ALBUMIN 3.4* 2.9* 2.8*    Recent Labs Lab 01/13/16 1304  LIPASE 26   No results for input(s): AMMONIA in the last 168 hours. CBC:  Recent Labs Lab 01/13/16 1304 01/13/16 2223 01/14/16 0412 01/15/16 0601  WBC 6.9 6.2 5.6 5.1  NEUTROABS 4.0 3.3  --   --   HGB 12.6* 11.7* 11.7* 11.9*  HCT 39.5 35.8* 35.8* 36.4*  MCV 99.0 97.0 97.5 97.3  PLT 211 179 208 194   Cardiac Enzymes: No results for input(s): CKTOTAL, CKMB, CKMBINDEX, TROPONINI in the last 168 hours. BNP: Invalid input(s): POCBNP CBG:  Recent Labs Lab 01/13/16 2039 01/14/16 1756 01/14/16 2054 01/15/16 0727 01/15/16 1127  GLUCAP 152* 152* 205* 126* 135*   D-Dimer No results for input(s): DDIMER in the last 72 hours. Hgb A1c No results for input(s): HGBA1C in the last 72 hours. Lipid Profile No results for input(s): CHOL, HDL, LDLCALC, TRIG, CHOLHDL, LDLDIRECT in the last 72 hours. Thyroid function studies No results for input(s): TSH, T4TOTAL, T3FREE, THYROIDAB in the last 72 hours.  Invalid input(s): FREET3 Anemia work up No results for input(s): VITAMINB12, FOLATE, FERRITIN, TIBC, IRON, RETICCTPCT in the last 72 hours. Urinalysis No results found for: COLORURINE, APPEARANCEUR, [place], [place], [place], [place], [place], [place], PROTEINUR, UROBILINOGEN, NITRITE, LEUKOCYTESUR Sepsis Labs Invalid input(s): PROCALCITONIN,  WBC,  LACTICIDVEN Microbiology Recent Results (from the past 240 hour(s))  C difficile quick scan w PCR reflex     Status: None   Collection Time: 01/13/16 12:42 PM  Result Value  Ref Range Status   C Diff antigen NEGATIVE NEGATIVE Final   C Diff toxin NEGATIVE NEGATIVE Final   C Diff interpretation No C. difficile detected.  Final  Gastrointestinal Panel by PCR , Stool     Status: Abnormal   Collection Time: 01/13/16  4:20 PM  Result Value Ref Range Status   Campylobacter species NOT DETECTED NOT DETECTED Final   Plesimonas shigelloides NOT DETECTED NOT DETECTED Final   Salmonella species NOT DETECTED NOT DETECTED Final   Yersinia enterocolitica NOT DETECTED NOT DETECTED Final   Vibrio species NOT DETECTED NOT DETECTED Final   Vibrio cholerae NOT DETECTED NOT DETECTED Final   Enteroaggregative E coli (EAEC) NOT DETECTED NOT DETECTED Final   Enteropathogenic E coli (EPEC) DETECTED (A) NOT DETECTED Final    Comment: RESULT CALLED TO, READ BACK BY AND VERIFIED WITH: MORGAN DISHMON 01/14/16 @ 1624  MLK    Enterotoxigenic E coli (ETEC) NOT DETECTED NOT DETECTED Final   Shiga like toxin producing E coli (STEC) NOT DETECTED NOT DETECTED Final   Shigella/Enteroinvasive E coli (EIEC) NOT DETECTED NOT DETECTED Final   Cryptosporidium NOT DETECTED NOT DETECTED Final   Cyclospora cayetanensis NOT DETECTED NOT DETECTED Final   Entamoeba histolytica NOT DETECTED NOT DETECTED Final   Giardia lamblia NOT DETECTED NOT DETECTED Final   Adenovirus F40/41 NOT DETECTED NOT DETECTED Final   Astrovirus NOT DETECTED NOT DETECTED Final   Norovirus GI/GII NOT DETECTED NOT DETECTED Final   Rotavirus A NOT DETECTED NOT DETECTED Final   Sapovirus (I, II, IV, and V) NOT DETECTED NOT DETECTED Final     Time coordinating discharge: Over 30 minutes  SIGNED:   Kathie Dike, MD  [facility] 01/15/2016, 7:25 PM Pager   If 7PM-7AM, please contact night-coverage [email or web address] Password [email or web address]

## 2016-01-15 NOTE — Progress Notes (Signed)
Pt is to be discharged home and in stable condition. IV removed. Discharge instructions discussed. Patient verbalizes understanding. No further questions at this time.  Celestia Khat, RN

## 2016-01-15 NOTE — Progress Notes (Signed)
He isSubjective: Interval History: has no complaint of nausea or vomiting. His diarrhea has improved. He has 2 episodes last night. She denies any abdominal pain. Objective: Vital signs in last 24 hours: Temp:  [98 F (36.7 C)-98.5 F (36.9 C)] 98.2 F (36.8 C) (12/23 0657) Pulse Rate:  [62-102] 66 (12/23 0657) Resp:  [16-18] 18 (12/23 0657) BP: (92-126)/(49-70) 120/59 (12/23 0657) SpO2:  [96 %-99 %] 96 % (12/23 0657) Weight:  [68.7 kg (151 lb 7.3 oz)-69.6 kg (153 lb 8 oz)] 69.6 kg (153 lb 8 oz) (12/23 0657) Weight change: -0.247 kg (-8.7 oz)  Intake/Output from previous day: 12/22 0701 - 12/23 0700 In: 240 [P.O.:240] Out: 1554  Intake/Output this shift: Total I/O In: 240 [P.O.:240] Out: -   General appearance: alert, cooperative and no distress Resp: clear to auscultation bilaterally Cardio: regular rate and rhythm Extremities: No edema  Lab Results:  Recent Labs  01/14/16 0412 01/15/16 0601  WBC 5.6 5.1  HGB 11.7* 11.9*  HCT 35.8* 36.4*  PLT 208 194   BMET:  Recent Labs  01/14/16 0412 01/15/16 0601  NA 138 139  K 3.7 3.6  CL 104 101  CO2 23 29  GLUCOSE 89 125*  BUN 38* 28*  CREATININE 6.76* 5.28*  CALCIUM 7.8* 7.8*   No results for input(s): PTH in the last 72 hours. Iron Studies: No results for input(s): IRON, TIBC, TRANSFERRIN, FERRITIN in the last 72 hours.  Studies/Results: Ct Abdomen Pelvis Wo Contrast  Result Date: 01/13/2016 CLINICAL DATA:  Diarrhea for several months. On antibiotic therapy for 9 days without improvement. History of end-stage renal disease, renal transplant and diabetes. EXAM: CT ABDOMEN AND PELVIS WITHOUT CONTRAST TECHNIQUE: Multidetector CT imaging of the abdomen and pelvis was performed following the standard protocol without IV contrast. COMPARISON:  CTs 01/01/2013. FINDINGS: Lower chest: Extensive coronary artery atherosclerosis noted. There is lesser atherosclerosis and tortuosity of the thoracic aorta. The lung bases are  clear. There is no pleural effusion. Bilateral gynecomastia noted. Hepatobiliary: The liver appears unremarkable as imaged in the noncontrast state. No significant biliary dilatation post cholecystectomy. Pancreas: Atrophied with fatty replacement. No evidence of pancreatic mass, ductal dilatation or surrounding inflammation. Spleen: Normal in size without focal abnormality. Adrenals/Urinary Tract: The adrenal glands appear stable without suspicious findings. The native kidneys are markedly atrophied with diffuse cortical thinning. There are enlarging low-density renal lesions bilaterally, more numerous on the right, probably all cysts based on attenuation. There is no hydronephrosis. Transplanted kidney in the right iliac fossa demonstrates a probable cyst in its lower pole, measuring up to 2.0 cm. No evidence of urinary tract calculus per there is mild perinephric soft tissue stranding around the transplanted kidney. Prominence of the bladder wall is probably related to incomplete distention. Stomach/Bowel: The stomach and small bowel demonstrate no significant findings. The appendix appears normal. Allowing for incomplete colonic distension, there is no definite colonic wall thickening or surrounding inflammation. There is stable laxity of the anterior abdominal wall in the right lower quadrant, likely a broad-based incisional hernia. This appears unchanged. Vascular/Lymphatic: There are no enlarged abdominal or pelvic lymph nodes. Extensive aortic and branch vessel atherosclerosis. Reproductive: The prostate gland and seminal vesicles appear unremarkable. Calcifications of the vas deferens noted. Other: No ascites. As above, stable laxity of the anterior abdominal wall in the right lower quadrant, probably an incisional hernia. Musculoskeletal: No acute or significant osseous findings. Previous L4 through S1 fusion. IMPRESSION: 1. No definite signs of colitis. The colon is incompletely distended  without gross  wall thickening or surrounding inflammation. 2. Perinephric soft tissue stranding around the transplant kidney in the right iliac fossa, potentially inflammatory. No evidence of urinary tract calculus or hydronephrosis. Probable cyst in the lower pole of the transplanted kidney. 3. Chronic atrophy of the native kidneys with enlarging low-density lesions, likely cysts. 4. Probable chronic incisional hernia in the right lower quadrant. 5. Extensive atherosclerosis. Electronically Signed   By: Richardean Sale M.D.   On: 01/13/2016 15:44    I have reviewed the patient's current medications.  Assessment/Plan:  problem #1 diarrhea: At this moment improving. Patient has been seen by GI and he has an appointment for Tuesday. Problem #2  end-stage renal disease: His status post hemodialysis yesterday. Presently he doesn't have any uremic sinus symptoms. Problem #3 hypertension: His blood pressure is reasonably controlled Problem #4 history of diabetes Problem #5 anemia: His hemoglobin is within target goal Problem #6 metabolic bone disease: His calcium is in range but his phosphorus is slightly high Problem #7 fluid management: Presently patient denies any difficulty breathing. We are able to remove one and a half it was yesterday because of hypotension.  Plan: 1]Patient doesn't require dialysis today 2 I his next dialysis would be on Monday and could be done as an outpatient if patient is going to be discharged. 3] I follow patient as an outpatient  LOS: 1 day   Hazem Kenner S 01/15/2016,9:42 AM

## 2016-01-16 DIAGNOSIS — N186 End stage renal disease: Secondary | ICD-10-CM | POA: Diagnosis not present

## 2016-01-16 DIAGNOSIS — E162 Hypoglycemia, unspecified: Secondary | ICD-10-CM | POA: Diagnosis not present

## 2016-01-16 DIAGNOSIS — N2581 Secondary hyperparathyroidism of renal origin: Secondary | ICD-10-CM | POA: Diagnosis not present

## 2016-01-16 DIAGNOSIS — Z992 Dependence on renal dialysis: Secondary | ICD-10-CM | POA: Diagnosis not present

## 2016-01-16 DIAGNOSIS — D631 Anemia in chronic kidney disease: Secondary | ICD-10-CM | POA: Diagnosis not present

## 2016-01-16 DIAGNOSIS — D509 Iron deficiency anemia, unspecified: Secondary | ICD-10-CM | POA: Diagnosis not present

## 2016-01-19 DIAGNOSIS — D631 Anemia in chronic kidney disease: Secondary | ICD-10-CM | POA: Diagnosis not present

## 2016-01-19 DIAGNOSIS — N2581 Secondary hyperparathyroidism of renal origin: Secondary | ICD-10-CM | POA: Diagnosis not present

## 2016-01-19 DIAGNOSIS — N186 End stage renal disease: Secondary | ICD-10-CM | POA: Diagnosis not present

## 2016-01-19 DIAGNOSIS — E162 Hypoglycemia, unspecified: Secondary | ICD-10-CM | POA: Diagnosis not present

## 2016-01-19 DIAGNOSIS — Z992 Dependence on renal dialysis: Secondary | ICD-10-CM | POA: Diagnosis not present

## 2016-01-19 DIAGNOSIS — D509 Iron deficiency anemia, unspecified: Secondary | ICD-10-CM | POA: Diagnosis not present

## 2016-01-20 ENCOUNTER — Encounter: Payer: Self-pay | Admitting: Gastroenterology

## 2016-01-20 LAB — FECAL LACTOFERRIN, QUANT: LACTOFERRIN, FECAL, QUANT.: 2.33 ug/mL (ref 0.00–7.24)

## 2016-01-20 NOTE — Progress Notes (Signed)
CC'D TO PCP °

## 2016-01-21 DIAGNOSIS — N2581 Secondary hyperparathyroidism of renal origin: Secondary | ICD-10-CM | POA: Diagnosis not present

## 2016-01-21 DIAGNOSIS — E162 Hypoglycemia, unspecified: Secondary | ICD-10-CM | POA: Diagnosis not present

## 2016-01-21 DIAGNOSIS — D509 Iron deficiency anemia, unspecified: Secondary | ICD-10-CM | POA: Diagnosis not present

## 2016-01-21 DIAGNOSIS — N186 End stage renal disease: Secondary | ICD-10-CM | POA: Diagnosis not present

## 2016-01-21 DIAGNOSIS — D631 Anemia in chronic kidney disease: Secondary | ICD-10-CM | POA: Diagnosis not present

## 2016-01-21 DIAGNOSIS — Z992 Dependence on renal dialysis: Secondary | ICD-10-CM | POA: Diagnosis not present

## 2016-01-21 LAB — PANCREATIC ELASTASE, FECAL: Pancreatic Elastase-1, Stool: >500 ug Elast./g (ref >200)

## 2016-01-23 DIAGNOSIS — N2581 Secondary hyperparathyroidism of renal origin: Secondary | ICD-10-CM | POA: Diagnosis not present

## 2016-01-23 DIAGNOSIS — D509 Iron deficiency anemia, unspecified: Secondary | ICD-10-CM | POA: Diagnosis not present

## 2016-01-23 DIAGNOSIS — E162 Hypoglycemia, unspecified: Secondary | ICD-10-CM | POA: Diagnosis not present

## 2016-01-23 DIAGNOSIS — N186 End stage renal disease: Secondary | ICD-10-CM | POA: Diagnosis not present

## 2016-01-23 DIAGNOSIS — Z992 Dependence on renal dialysis: Secondary | ICD-10-CM | POA: Diagnosis not present

## 2016-01-23 DIAGNOSIS — D631 Anemia in chronic kidney disease: Secondary | ICD-10-CM | POA: Diagnosis not present

## 2016-01-25 NOTE — Progress Notes (Signed)
LMOM to call.

## 2016-01-26 DIAGNOSIS — E162 Hypoglycemia, unspecified: Secondary | ICD-10-CM | POA: Diagnosis not present

## 2016-01-26 DIAGNOSIS — E11649 Type 2 diabetes mellitus with hypoglycemia without coma: Secondary | ICD-10-CM | POA: Diagnosis not present

## 2016-01-26 DIAGNOSIS — Z992 Dependence on renal dialysis: Secondary | ICD-10-CM | POA: Diagnosis not present

## 2016-01-26 DIAGNOSIS — N2581 Secondary hyperparathyroidism of renal origin: Secondary | ICD-10-CM | POA: Diagnosis not present

## 2016-01-26 DIAGNOSIS — N186 End stage renal disease: Secondary | ICD-10-CM | POA: Diagnosis not present

## 2016-01-26 DIAGNOSIS — D509 Iron deficiency anemia, unspecified: Secondary | ICD-10-CM | POA: Diagnosis not present

## 2016-01-26 NOTE — Progress Notes (Signed)
PT is aware and wants to know if he is supposed to have a colonoscopy.  Forwarding to Dr. Oneida Alar to advise!

## 2016-01-27 DIAGNOSIS — I10 Essential (primary) hypertension: Secondary | ICD-10-CM | POA: Diagnosis not present

## 2016-01-27 DIAGNOSIS — B351 Tinea unguium: Secondary | ICD-10-CM | POA: Diagnosis not present

## 2016-01-27 DIAGNOSIS — L84 Corns and callosities: Secondary | ICD-10-CM | POA: Diagnosis not present

## 2016-01-27 DIAGNOSIS — N185 Chronic kidney disease, stage 5: Secondary | ICD-10-CM | POA: Diagnosis not present

## 2016-01-27 DIAGNOSIS — R195 Other fecal abnormalities: Secondary | ICD-10-CM | POA: Diagnosis not present

## 2016-01-27 DIAGNOSIS — E1121 Type 2 diabetes mellitus with diabetic nephropathy: Secondary | ICD-10-CM | POA: Diagnosis not present

## 2016-01-27 DIAGNOSIS — E1151 Type 2 diabetes mellitus with diabetic peripheral angiopathy without gangrene: Secondary | ICD-10-CM | POA: Diagnosis not present

## 2016-01-27 NOTE — Progress Notes (Signed)
Pt is aware.  

## 2016-01-28 DIAGNOSIS — E11649 Type 2 diabetes mellitus with hypoglycemia without coma: Secondary | ICD-10-CM | POA: Diagnosis not present

## 2016-01-28 DIAGNOSIS — Z992 Dependence on renal dialysis: Secondary | ICD-10-CM | POA: Diagnosis not present

## 2016-01-28 DIAGNOSIS — D509 Iron deficiency anemia, unspecified: Secondary | ICD-10-CM | POA: Diagnosis not present

## 2016-01-28 DIAGNOSIS — N186 End stage renal disease: Secondary | ICD-10-CM | POA: Diagnosis not present

## 2016-01-28 DIAGNOSIS — E162 Hypoglycemia, unspecified: Secondary | ICD-10-CM | POA: Diagnosis not present

## 2016-01-28 DIAGNOSIS — N2581 Secondary hyperparathyroidism of renal origin: Secondary | ICD-10-CM | POA: Diagnosis not present

## 2016-01-31 DIAGNOSIS — N2581 Secondary hyperparathyroidism of renal origin: Secondary | ICD-10-CM | POA: Diagnosis not present

## 2016-01-31 DIAGNOSIS — Z992 Dependence on renal dialysis: Secondary | ICD-10-CM | POA: Diagnosis not present

## 2016-01-31 DIAGNOSIS — N186 End stage renal disease: Secondary | ICD-10-CM | POA: Diagnosis not present

## 2016-01-31 DIAGNOSIS — D509 Iron deficiency anemia, unspecified: Secondary | ICD-10-CM | POA: Diagnosis not present

## 2016-01-31 DIAGNOSIS — E162 Hypoglycemia, unspecified: Secondary | ICD-10-CM | POA: Diagnosis not present

## 2016-01-31 DIAGNOSIS — E11649 Type 2 diabetes mellitus with hypoglycemia without coma: Secondary | ICD-10-CM | POA: Diagnosis not present

## 2016-02-02 DIAGNOSIS — N186 End stage renal disease: Secondary | ICD-10-CM | POA: Diagnosis not present

## 2016-02-02 DIAGNOSIS — E11649 Type 2 diabetes mellitus with hypoglycemia without coma: Secondary | ICD-10-CM | POA: Diagnosis not present

## 2016-02-02 DIAGNOSIS — D509 Iron deficiency anemia, unspecified: Secondary | ICD-10-CM | POA: Diagnosis not present

## 2016-02-02 DIAGNOSIS — E162 Hypoglycemia, unspecified: Secondary | ICD-10-CM | POA: Diagnosis not present

## 2016-02-02 DIAGNOSIS — Z992 Dependence on renal dialysis: Secondary | ICD-10-CM | POA: Diagnosis not present

## 2016-02-02 DIAGNOSIS — N2581 Secondary hyperparathyroidism of renal origin: Secondary | ICD-10-CM | POA: Diagnosis not present

## 2016-02-04 DIAGNOSIS — E162 Hypoglycemia, unspecified: Secondary | ICD-10-CM | POA: Diagnosis not present

## 2016-02-04 DIAGNOSIS — N2581 Secondary hyperparathyroidism of renal origin: Secondary | ICD-10-CM | POA: Diagnosis not present

## 2016-02-04 DIAGNOSIS — D509 Iron deficiency anemia, unspecified: Secondary | ICD-10-CM | POA: Diagnosis not present

## 2016-02-04 DIAGNOSIS — N186 End stage renal disease: Secondary | ICD-10-CM | POA: Diagnosis not present

## 2016-02-04 DIAGNOSIS — E11649 Type 2 diabetes mellitus with hypoglycemia without coma: Secondary | ICD-10-CM | POA: Diagnosis not present

## 2016-02-04 DIAGNOSIS — Z992 Dependence on renal dialysis: Secondary | ICD-10-CM | POA: Diagnosis not present

## 2016-02-07 DIAGNOSIS — Z992 Dependence on renal dialysis: Secondary | ICD-10-CM | POA: Diagnosis not present

## 2016-02-07 DIAGNOSIS — N186 End stage renal disease: Secondary | ICD-10-CM | POA: Diagnosis not present

## 2016-02-07 DIAGNOSIS — N2581 Secondary hyperparathyroidism of renal origin: Secondary | ICD-10-CM | POA: Diagnosis not present

## 2016-02-07 DIAGNOSIS — E162 Hypoglycemia, unspecified: Secondary | ICD-10-CM | POA: Diagnosis not present

## 2016-02-07 DIAGNOSIS — E11649 Type 2 diabetes mellitus with hypoglycemia without coma: Secondary | ICD-10-CM | POA: Diagnosis not present

## 2016-02-07 DIAGNOSIS — D509 Iron deficiency anemia, unspecified: Secondary | ICD-10-CM | POA: Diagnosis not present

## 2016-02-08 ENCOUNTER — Encounter: Payer: Self-pay | Admitting: Gastroenterology

## 2016-02-08 ENCOUNTER — Ambulatory Visit (INDEPENDENT_AMBULATORY_CARE_PROVIDER_SITE_OTHER): Payer: Medicare Other | Admitting: Gastroenterology

## 2016-02-08 VITALS — BP 140/86 | HR 112 | Temp 97.5°F | Ht 70.0 in | Wt 156.4 lb

## 2016-02-08 DIAGNOSIS — R197 Diarrhea, unspecified: Secondary | ICD-10-CM

## 2016-02-08 NOTE — Patient Instructions (Signed)
1. I will discuss case with Dr. Oneida Alar and let you know her recommendations. We refills Colestid and loperamide after plan determined.

## 2016-02-08 NOTE — Progress Notes (Addendum)
REVIEWED. PT CANCELED TCS.   Primary Care Physician:  Neale Burly, MD  Primary Gastroenterologist:  Barney Drain, MD   Chief Complaint  Patient presents with  . Diarrhea    some better    HPI:  Wayne Hopkins is a 76 y.o. male here for hospital follow-up. Seen back in December for diarrhea. 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 fib flutter. Patient has history of positive C. difficile in mid December treated with vancomycin. During hospitalization C. difficile quick scan was negative, GI pathogen panel returned positive for Escherichia coli. Infectious diseases were consulted. Supportive treatment was recommended and as patient was improving no antibiotic was ordered. He was treated with Colestid 1 g twice a day.  Prednisone every other day. On diaylsis since 08/2015. BM several times daily, every time eats. If doesn't take colestid back to "square one". Ate beef stew last night and had bad diarrhea all night long. Avoiding dairy now. Diarrhea goes back along time before hospitalization. Wears depends. No blood per rectum. No abd pain. No heartburn. No recent weight loss although he reports 40 pound weight loss in 2015 with sepsis.   Patient states he has never had a colonoscopy. States he was told not to because he has a large right sided abd hernia. Transplanted kidney also RLQ.    Current Outpatient Prescriptions  Medication Sig Dispense Refill  . aspirin EC 81 MG tablet Take 81 mg by mouth daily.    . Cholecalciferol (VITAMIN D-1000 MAX ST) 1000 units tablet Take 2 tablets by mouth daily.    . colestipol (COLESTID) 1 g tablet Take 1 tablet (1 g total) by mouth 2 (two) times daily. 60 tablet 0  . loperamide (IMODIUM A-D) 2 MG tablet Take 1 tablet (2  mg total) by mouth 4 (four) times daily as needed for diarrhea or loose stools. 30 tablet 0  . Omega-3 Fatty Acids (FISH OIL) 1200 MG CAPS Take by mouth 2 (two) times daily.    . predniSONE (DELTASONE) 10 MG tablet Take 5 mg by mouth daily. 5 mg every other day    . sodium bicarbonate 650 MG tablet Take 1,300 mg by mouth 2 (two) times daily.     No current facility-administered medications for this visit.     Allergies as of 02/08/2016 - Review Complete 02/08/2016  Allergen Reaction Noted  . Penicillins Swelling 01/13/2016  . Tape Other (See Comments) 01/13/2016    Past Medical History:  Diagnosis Date  . Atrial flutter ([place])   . DDD (degenerative disc disease), lumbar   . Diabetes mellitus without complication ([place])   . ESRD (end stage renal disease) ([place])   . Hyperlipidemia   . Hypertension   . Long-term use of immunosuppressant medication   . Lumbar spondylosis   . Peripheral edema   . Stroke [facility]     Past Surgical History:  Procedure Laterality Date  . BACK SURGERY    . CHOLECYSTECTOMY    . KIDNEY TRANSPLANT  2005    Family History  Problem Relation Age of Onset  . Stroke Mother   . Heart attack Father   . Kidney failure Sister   . Kidney failure Other     Social History   Social History  . Marital status: Married    Spouse name: N/A  . Number  of children: N/A  . Years of education: N/A   Occupational History  . Not on file.   Social History Main Topics  . Smoking status: Former Research scientist (life sciences)  . Smokeless tobacco: Never Used     Comment: Quit x 33 years  . Alcohol use No  . Drug use: No  . Sexual activity: Not on file   Other Topics Concern  . Not on file   Social History Narrative  . No narrative on file      ROS:  General: Negative for anorexia, weight loss, fever, chills, fatigue, weakness. Eyes: Negative for vision changes.  ENT: Negative for hoarseness, difficulty swallowing , nasal congestion. CV: Negative for chest pain, angina,  palpitations, dyspnea on exertion, peripheral edema.  Respiratory: Negative for dyspnea at rest, dyspnea on exertion, cough, sputum, wheezing.  GI: See history of present illness. GU:  Negative for dysuria, hematuria, urinary incontinence, urinary frequency, nocturnal urination.  MS: Negative for joint pain, low back pain.  Derm: Negative for rash or itching.  Neuro: Negative for weakness, abnormal sensation, seizure, frequent headaches, memory loss, confusion.  Psych: Negative for anxiety, depression, suicidal ideation, hallucinations.  Endo: Negative for unusual weight change.  Heme: Negative for bruising or bleeding. Allergy: Negative for rash or hives.    Physical Examination:  BP 140/86   Pulse (!) 112   Temp 97.5 F (36.4 C) (Oral)   Ht 5\' 10"  (1.778 m)   Wt 156 lb 6.4 oz (70.9 kg)   BMI 22.44 kg/m    General: Well-nourished, well-developed in no acute distress.  Head: Normocephalic, atraumatic.   Eyes: Conjunctiva pink, no icterus. Mouth: Oropharyngeal mucosa moist and pink , no lesions erythema or exudate. Neck: Supple without thyromegaly, masses, or lymphadenopathy.  Lungs: Clear to auscultation bilaterally.  Heart: Regular rate and rhythm, no murmurs rubs or gallops.  Abdomen: Bowel sounds are normal, nontender, nondistended, no hepatosplenomegaly or masses, no abdominal bruits , no rebound or guarding.  Transplanted kidney palpated in rlq. Large incisional hernia noted.  Rectal: not performed Extremities: No lower extremity edema. No clubbing or deformities.  Neuro: Alert and oriented x 4 , grossly normal neurologically.  Skin: Warm and dry, no rash or jaundice.   Psych: Alert and cooperative, normal mood and affect.  Labs: Lab Results  Component Value Date   WBC 5.1 01/15/2016   HGB 11.9 (L) 01/15/2016   HCT 36.4 (L) 01/15/2016   MCV 97.3 01/15/2016   PLT 194 01/15/2016   Lab Results  Component Value Date   CREATININE 5.28 (H) 01/15/2016   BUN 28 (H)  01/15/2016   NA 139 01/15/2016   K 3.6 01/15/2016   CL 101 01/15/2016   CO2 29 01/15/2016   Lab Results  Component Value Date   ALT 10 (L) 01/14/2016   AST 13 (L) 01/14/2016   ALKPHOS 72 01/14/2016   BILITOT 0.6 01/14/2016   Lactoferrin normal December 22 Pancreatic elactase greater than 500 normal  Imaging Studies: Ct Abdomen Pelvis Wo Contrast  Result Date: 01/13/2016 CLINICAL DATA:  Diarrhea for several months. On antibiotic therapy for 9 days without improvement. History of end-stage renal disease, renal transplant and diabetes. EXAM: CT ABDOMEN AND PELVIS WITHOUT CONTRAST TECHNIQUE: Multidetector CT imaging of the abdomen and pelvis was performed following the standard protocol without IV contrast. COMPARISON:  CTs 01/01/2013. FINDINGS: Lower chest: Extensive coronary artery atherosclerosis noted. There is lesser atherosclerosis and tortuosity of the thoracic aorta. The lung bases are clear. There is no pleural  effusion. Bilateral gynecomastia noted. Hepatobiliary: The liver appears unremarkable as imaged in the noncontrast state. No significant biliary dilatation post cholecystectomy. Pancreas: Atrophied with fatty replacement. No evidence of pancreatic mass, ductal dilatation or surrounding inflammation. Spleen: Normal in size without focal abnormality. Adrenals/Urinary Tract: The adrenal glands appear stable without suspicious findings. The native kidneys are markedly atrophied with diffuse cortical thinning. There are enlarging low-density renal lesions bilaterally, more numerous on the right, probably all cysts based on attenuation. There is no hydronephrosis. Transplanted kidney in the right iliac fossa demonstrates a probable cyst in its lower pole, measuring up to 2.0 cm. No evidence of urinary tract calculus per there is mild perinephric soft tissue stranding around the transplanted kidney. Prominence of the bladder wall is probably related to incomplete distention. Stomach/Bowel:  The stomach and small bowel demonstrate no significant findings. The appendix appears normal. Allowing for incomplete colonic distension, there is no definite colonic wall thickening or surrounding inflammation. There is stable laxity of the anterior abdominal wall in the right lower quadrant, likely a broad-based incisional hernia. This appears unchanged. Vascular/Lymphatic: There are no enlarged abdominal or pelvic lymph nodes. Extensive aortic and branch vessel atherosclerosis. Reproductive: The prostate gland and seminal vesicles appear unremarkable. Calcifications of the vas deferens noted. Other: No ascites. As above, stable laxity of the anterior abdominal wall in the right lower quadrant, probably an incisional hernia. Musculoskeletal: No acute or significant osseous findings. Previous L4 through S1 fusion. IMPRESSION: 1. No definite signs of colitis. The colon is incompletely distended without gross wall thickening or surrounding inflammation. 2. Perinephric soft tissue stranding around the transplant kidney in the right iliac fossa, potentially inflammatory. No evidence of urinary tract calculus or hydronephrosis. Probable cyst in the lower pole of the transplanted kidney. 3. Chronic atrophy of the native kidneys with enlarging low-density lesions, likely cysts. 4. Probable chronic incisional hernia in the right lower quadrant. 5. Extensive atherosclerosis. Electronically Signed   By: Richardean Sale M.D.   On: 01/13/2016 15:44

## 2016-02-09 DIAGNOSIS — E162 Hypoglycemia, unspecified: Secondary | ICD-10-CM | POA: Diagnosis not present

## 2016-02-09 DIAGNOSIS — Z992 Dependence on renal dialysis: Secondary | ICD-10-CM | POA: Diagnosis not present

## 2016-02-09 DIAGNOSIS — N186 End stage renal disease: Secondary | ICD-10-CM | POA: Diagnosis not present

## 2016-02-09 DIAGNOSIS — E11649 Type 2 diabetes mellitus with hypoglycemia without coma: Secondary | ICD-10-CM | POA: Diagnosis not present

## 2016-02-09 DIAGNOSIS — D509 Iron deficiency anemia, unspecified: Secondary | ICD-10-CM | POA: Diagnosis not present

## 2016-02-09 DIAGNOSIS — N2581 Secondary hyperparathyroidism of renal origin: Secondary | ICD-10-CM | POA: Diagnosis not present

## 2016-02-11 DIAGNOSIS — N186 End stage renal disease: Secondary | ICD-10-CM | POA: Diagnosis not present

## 2016-02-11 DIAGNOSIS — N2581 Secondary hyperparathyroidism of renal origin: Secondary | ICD-10-CM | POA: Diagnosis not present

## 2016-02-11 DIAGNOSIS — Z992 Dependence on renal dialysis: Secondary | ICD-10-CM | POA: Diagnosis not present

## 2016-02-11 DIAGNOSIS — E162 Hypoglycemia, unspecified: Secondary | ICD-10-CM | POA: Diagnosis not present

## 2016-02-11 DIAGNOSIS — D509 Iron deficiency anemia, unspecified: Secondary | ICD-10-CM | POA: Diagnosis not present

## 2016-02-11 DIAGNOSIS — E11649 Type 2 diabetes mellitus with hypoglycemia without coma: Secondary | ICD-10-CM | POA: Diagnosis not present

## 2016-02-11 NOTE — Assessment & Plan Note (Signed)
76 y/o male here for f/u recent hospitalization for acute on chronic diarrhea. Treated by PCP/OSH early 12/2015 for Cdiff diarrhea. Cdiff negative late 12/2015 but GI pathogen positive for Ecoli. Work up with no evidence of pancreatic insufficiency. Lactoferin normal. Patient has had some modest improvement on Colestid. Interesting to note that he has had chronic diarrhea that has been more debilitating over the past couple of months, as he is tapering off his prednisone. He has had no prior colonoscopy. He has been reluctant due to his large incisional hernia. At this point, I would recommend proceeding with colonoscopy for further evaluation of his diarrhea.  I have discussed the risks, alternatives, benefits with regards to but not limited to the risk of reaction to medication, bleeding, infection, perforation and the patient is agreeable to proceed. Written consent to be obtained.  Patient did ask that I get Dr. Oneida Alar opinion, so we will discuss with her as well.

## 2016-02-14 DIAGNOSIS — E162 Hypoglycemia, unspecified: Secondary | ICD-10-CM | POA: Diagnosis not present

## 2016-02-14 DIAGNOSIS — E11649 Type 2 diabetes mellitus with hypoglycemia without coma: Secondary | ICD-10-CM | POA: Diagnosis not present

## 2016-02-14 DIAGNOSIS — N186 End stage renal disease: Secondary | ICD-10-CM | POA: Diagnosis not present

## 2016-02-14 DIAGNOSIS — N2581 Secondary hyperparathyroidism of renal origin: Secondary | ICD-10-CM | POA: Diagnosis not present

## 2016-02-14 DIAGNOSIS — Z992 Dependence on renal dialysis: Secondary | ICD-10-CM | POA: Diagnosis not present

## 2016-02-14 DIAGNOSIS — D509 Iron deficiency anemia, unspecified: Secondary | ICD-10-CM | POA: Diagnosis not present

## 2016-02-14 NOTE — Progress Notes (Signed)
cc'ed to pcp °

## 2016-02-16 ENCOUNTER — Telehealth: Payer: Self-pay

## 2016-02-16 DIAGNOSIS — E11649 Type 2 diabetes mellitus with hypoglycemia without coma: Secondary | ICD-10-CM | POA: Diagnosis not present

## 2016-02-16 DIAGNOSIS — E162 Hypoglycemia, unspecified: Secondary | ICD-10-CM | POA: Diagnosis not present

## 2016-02-16 DIAGNOSIS — Z992 Dependence on renal dialysis: Secondary | ICD-10-CM | POA: Diagnosis not present

## 2016-02-16 DIAGNOSIS — N186 End stage renal disease: Secondary | ICD-10-CM | POA: Diagnosis not present

## 2016-02-16 DIAGNOSIS — N2581 Secondary hyperparathyroidism of renal origin: Secondary | ICD-10-CM | POA: Diagnosis not present

## 2016-02-16 DIAGNOSIS — D509 Iron deficiency anemia, unspecified: Secondary | ICD-10-CM | POA: Diagnosis not present

## 2016-02-16 NOTE — Telephone Encounter (Signed)
Pt called and said he is still having diarrhea after he eats anything at all. He had it this week during dialysis and he said " I am so tired of this, I can't stand it". He said he is so frustrated and wants to know what to do. Please advise!

## 2016-02-16 NOTE — Telephone Encounter (Signed)
Please schedule patient for colonoscopy with Dr. Oneida Alar. Dx: chronic diarrhea.   Make sure he holds colestid and imodium starting two days before his bowel prep.  Please verify patient is not taking any blood thinners (he has a.flutter).

## 2016-02-16 NOTE — Telephone Encounter (Signed)
I called pt and got disconnected. I called again and got VM. I left the message on VM that Magda Paganini said she has spoke to Dr. Oneida Alar and the plan is to schedule a colonoscopy. Someone will call him tomorrow morning about this. If he has questions he can call me back this afternoon.

## 2016-02-16 NOTE — Telephone Encounter (Signed)
PT called back and is aware.

## 2016-02-17 ENCOUNTER — Other Ambulatory Visit: Payer: Self-pay

## 2016-02-17 DIAGNOSIS — K529 Noninfective gastroenteritis and colitis, unspecified: Secondary | ICD-10-CM

## 2016-02-17 MED ORDER — PEG 3350-KCL-NA BICARB-NACL 420 G PO SOLR: 4000.0000 mL | ORAL | 0 refills | Status: DC

## 2016-02-17 NOTE — Telephone Encounter (Signed)
LMOVM for pt to call office to schedule colonoscopy.

## 2016-02-17 NOTE — Telephone Encounter (Signed)
Called pt. Scheduled colonoscopy with [facility] for 03/14/16 at 12:15pm. Reminded pt to hold Colestid and Imodium starting 2 days before prep (also on instructions). Instructions mailed to pt.

## 2016-02-17 NOTE — Telephone Encounter (Signed)
Pt said he is not on any blood thinners ( other than the ASA 81 mg). He is aware to hold colestid and immodium 2 days before the colonoscopy and I told him that will be put on his instructions also. Sending to [facility] to schedule. ( pt has dialysis on Mon-Wed-Fri).

## 2016-02-17 NOTE — Telephone Encounter (Signed)
Noted regarding ASA use. OK to continue.

## 2016-02-18 DIAGNOSIS — E162 Hypoglycemia, unspecified: Secondary | ICD-10-CM | POA: Diagnosis not present

## 2016-02-18 DIAGNOSIS — D509 Iron deficiency anemia, unspecified: Secondary | ICD-10-CM | POA: Diagnosis not present

## 2016-02-18 DIAGNOSIS — E11649 Type 2 diabetes mellitus with hypoglycemia without coma: Secondary | ICD-10-CM | POA: Diagnosis not present

## 2016-02-18 DIAGNOSIS — N186 End stage renal disease: Secondary | ICD-10-CM | POA: Diagnosis not present

## 2016-02-18 DIAGNOSIS — Z992 Dependence on renal dialysis: Secondary | ICD-10-CM | POA: Diagnosis not present

## 2016-02-18 DIAGNOSIS — N2581 Secondary hyperparathyroidism of renal origin: Secondary | ICD-10-CM | POA: Diagnosis not present

## 2016-02-21 ENCOUNTER — Other Ambulatory Visit: Payer: Self-pay | Admitting: Gastroenterology

## 2016-02-21 DIAGNOSIS — E162 Hypoglycemia, unspecified: Secondary | ICD-10-CM | POA: Diagnosis not present

## 2016-02-21 DIAGNOSIS — E11649 Type 2 diabetes mellitus with hypoglycemia without coma: Secondary | ICD-10-CM | POA: Diagnosis not present

## 2016-02-21 DIAGNOSIS — N2581 Secondary hyperparathyroidism of renal origin: Secondary | ICD-10-CM | POA: Diagnosis not present

## 2016-02-21 DIAGNOSIS — Z992 Dependence on renal dialysis: Secondary | ICD-10-CM | POA: Diagnosis not present

## 2016-02-21 DIAGNOSIS — N186 End stage renal disease: Secondary | ICD-10-CM | POA: Diagnosis not present

## 2016-02-21 DIAGNOSIS — D509 Iron deficiency anemia, unspecified: Secondary | ICD-10-CM | POA: Diagnosis not present

## 2016-02-23 ENCOUNTER — Other Ambulatory Visit: Payer: Self-pay

## 2016-02-23 DIAGNOSIS — N2581 Secondary hyperparathyroidism of renal origin: Secondary | ICD-10-CM | POA: Diagnosis not present

## 2016-02-23 DIAGNOSIS — E11649 Type 2 diabetes mellitus with hypoglycemia without coma: Secondary | ICD-10-CM | POA: Diagnosis not present

## 2016-02-23 DIAGNOSIS — E162 Hypoglycemia, unspecified: Secondary | ICD-10-CM | POA: Diagnosis not present

## 2016-02-23 DIAGNOSIS — D509 Iron deficiency anemia, unspecified: Secondary | ICD-10-CM | POA: Diagnosis not present

## 2016-02-23 DIAGNOSIS — N186 End stage renal disease: Secondary | ICD-10-CM | POA: Diagnosis not present

## 2016-02-23 DIAGNOSIS — Z992 Dependence on renal dialysis: Secondary | ICD-10-CM | POA: Diagnosis not present

## 2016-02-23 MED ORDER — COLESTIPOL HCL 1 G PO TABS: ORAL_TABLET | ORAL | 3 refills | Status: DC

## 2016-02-25 DIAGNOSIS — D631 Anemia in chronic kidney disease: Secondary | ICD-10-CM | POA: Diagnosis not present

## 2016-02-25 DIAGNOSIS — N186 End stage renal disease: Secondary | ICD-10-CM | POA: Diagnosis not present

## 2016-02-25 DIAGNOSIS — N2581 Secondary hyperparathyroidism of renal origin: Secondary | ICD-10-CM | POA: Diagnosis not present

## 2016-02-25 DIAGNOSIS — D509 Iron deficiency anemia, unspecified: Secondary | ICD-10-CM | POA: Diagnosis not present

## 2016-02-25 DIAGNOSIS — Z992 Dependence on renal dialysis: Secondary | ICD-10-CM | POA: Diagnosis not present

## 2016-02-28 DIAGNOSIS — N186 End stage renal disease: Secondary | ICD-10-CM | POA: Diagnosis not present

## 2016-02-28 DIAGNOSIS — D509 Iron deficiency anemia, unspecified: Secondary | ICD-10-CM | POA: Diagnosis not present

## 2016-02-28 DIAGNOSIS — D631 Anemia in chronic kidney disease: Secondary | ICD-10-CM | POA: Diagnosis not present

## 2016-02-28 DIAGNOSIS — Z992 Dependence on renal dialysis: Secondary | ICD-10-CM | POA: Diagnosis not present

## 2016-02-28 DIAGNOSIS — N2581 Secondary hyperparathyroidism of renal origin: Secondary | ICD-10-CM | POA: Diagnosis not present

## 2016-03-01 DIAGNOSIS — D631 Anemia in chronic kidney disease: Secondary | ICD-10-CM | POA: Diagnosis not present

## 2016-03-01 DIAGNOSIS — D509 Iron deficiency anemia, unspecified: Secondary | ICD-10-CM | POA: Diagnosis not present

## 2016-03-01 DIAGNOSIS — N2581 Secondary hyperparathyroidism of renal origin: Secondary | ICD-10-CM | POA: Diagnosis not present

## 2016-03-01 DIAGNOSIS — N186 End stage renal disease: Secondary | ICD-10-CM | POA: Diagnosis not present

## 2016-03-01 DIAGNOSIS — Z992 Dependence on renal dialysis: Secondary | ICD-10-CM | POA: Diagnosis not present

## 2016-03-02 ENCOUNTER — Telehealth: Payer: Self-pay | Admitting: Gastroenterology

## 2016-03-02 NOTE — Telephone Encounter (Signed)
Pt called to cancel his procedure with [facility] on 03/14/16

## 2016-03-02 NOTE — Telephone Encounter (Signed)
Noted  

## 2016-03-02 NOTE — Telephone Encounter (Signed)
Wayne Hopkins. Wayne Hopkins said he did not give a reason.

## 2016-03-02 NOTE — Telephone Encounter (Signed)
Procedure cancelled per pt request. LMOVM and informed Endo scheduler.

## 2016-03-03 DIAGNOSIS — Z992 Dependence on renal dialysis: Secondary | ICD-10-CM | POA: Diagnosis not present

## 2016-03-03 DIAGNOSIS — N186 End stage renal disease: Secondary | ICD-10-CM | POA: Diagnosis not present

## 2016-03-03 DIAGNOSIS — D631 Anemia in chronic kidney disease: Secondary | ICD-10-CM | POA: Diagnosis not present

## 2016-03-03 DIAGNOSIS — D509 Iron deficiency anemia, unspecified: Secondary | ICD-10-CM | POA: Diagnosis not present

## 2016-03-03 DIAGNOSIS — N2581 Secondary hyperparathyroidism of renal origin: Secondary | ICD-10-CM | POA: Diagnosis not present

## 2016-03-04 NOTE — Telephone Encounter (Signed)
REVIEWED-NO ADDITIONAL RECOMMENDATIONS. 

## 2016-03-06 DIAGNOSIS — D509 Iron deficiency anemia, unspecified: Secondary | ICD-10-CM | POA: Diagnosis not present

## 2016-03-06 DIAGNOSIS — Z992 Dependence on renal dialysis: Secondary | ICD-10-CM | POA: Diagnosis not present

## 2016-03-06 DIAGNOSIS — N186 End stage renal disease: Secondary | ICD-10-CM | POA: Diagnosis not present

## 2016-03-06 DIAGNOSIS — D631 Anemia in chronic kidney disease: Secondary | ICD-10-CM | POA: Diagnosis not present

## 2016-03-06 DIAGNOSIS — N2581 Secondary hyperparathyroidism of renal origin: Secondary | ICD-10-CM | POA: Diagnosis not present

## 2016-03-08 DIAGNOSIS — N186 End stage renal disease: Secondary | ICD-10-CM | POA: Diagnosis not present

## 2016-03-08 DIAGNOSIS — Z992 Dependence on renal dialysis: Secondary | ICD-10-CM | POA: Diagnosis not present

## 2016-03-08 DIAGNOSIS — D631 Anemia in chronic kidney disease: Secondary | ICD-10-CM | POA: Diagnosis not present

## 2016-03-08 DIAGNOSIS — D509 Iron deficiency anemia, unspecified: Secondary | ICD-10-CM | POA: Diagnosis not present

## 2016-03-08 DIAGNOSIS — N2581 Secondary hyperparathyroidism of renal origin: Secondary | ICD-10-CM | POA: Diagnosis not present

## 2016-03-09 NOTE — Telephone Encounter (Signed)
Routing to [facility] to reschedule the patient's procedure, please make sure you tell the patient to bring his enema the day of his procedure and the endoscopy staff will administer the enema   Routing to [facility]

## 2016-03-09 NOTE — Telephone Encounter (Signed)
LMOVM for pt to call office 

## 2016-03-09 NOTE — Telephone Encounter (Signed)
Patient called in and stated he really would like to reschedule his tcs, however he doesn't have anyone to give him the enema.  Is it ok if he drinks the solution and maybe have the enema at the hospital on the day of his  Procedure?  Patient can be reached [phone].

## 2016-03-09 NOTE — Telephone Encounter (Signed)
PLEASE CALL PT. HE CAN BRING ENEMA TO PREOP AND WE CAN ADMINISTER WHILE HE WAITS TO BE BROUGHT BACK.

## 2016-03-09 NOTE — Telephone Encounter (Signed)
Called pt back and gave TCS instructions on phone.

## 2016-03-09 NOTE — Telephone Encounter (Signed)
Pt called office. TCS with [facility] rescheduled for 03/14/16 at 2:45 pm, arrive at 1:45pm. Pt has not picked up Tri-[employer]te yet at pharmacy. Rx was sent in when procedure was originally scheduled. Faxed instructions to [facility] for him to pick up when he gets rx. Informed pt to take enema with him to the hospital for staff to give him.

## 2016-03-10 ENCOUNTER — Other Ambulatory Visit: Payer: Self-pay

## 2016-03-10 DIAGNOSIS — R197 Diarrhea, unspecified: Secondary | ICD-10-CM

## 2016-03-10 DIAGNOSIS — Z992 Dependence on renal dialysis: Secondary | ICD-10-CM | POA: Diagnosis not present

## 2016-03-10 DIAGNOSIS — D509 Iron deficiency anemia, unspecified: Secondary | ICD-10-CM | POA: Diagnosis not present

## 2016-03-10 DIAGNOSIS — D631 Anemia in chronic kidney disease: Secondary | ICD-10-CM | POA: Diagnosis not present

## 2016-03-10 DIAGNOSIS — N186 End stage renal disease: Secondary | ICD-10-CM | POA: Diagnosis not present

## 2016-03-10 DIAGNOSIS — N2581 Secondary hyperparathyroidism of renal origin: Secondary | ICD-10-CM | POA: Diagnosis not present

## 2016-03-10 NOTE — Telephone Encounter (Signed)
Pt called office and LMOVM that he has dialysis Monday and was wondering about drinking his prep for colonoscopy. I spoke with LSL and she advised that it would be ok since the prep is not absorbed. Pt needs to be at home when he drinks prep and drink clear liquids. Called and informed pt. He also said that he had talked to someone at his pharmacy and they received his faxed instructions.

## 2016-03-10 NOTE — Telephone Encounter (Signed)
Orders entered for TCS. Reminded pt yesterday to stop Colestid and Imodium for 2 days before prep (starting 03/11/16).

## 2016-03-13 ENCOUNTER — Telehealth: Payer: Self-pay

## 2016-03-13 DIAGNOSIS — N2581 Secondary hyperparathyroidism of renal origin: Secondary | ICD-10-CM | POA: Diagnosis not present

## 2016-03-13 DIAGNOSIS — N186 End stage renal disease: Secondary | ICD-10-CM | POA: Diagnosis not present

## 2016-03-13 DIAGNOSIS — D631 Anemia in chronic kidney disease: Secondary | ICD-10-CM | POA: Diagnosis not present

## 2016-03-13 DIAGNOSIS — Z992 Dependence on renal dialysis: Secondary | ICD-10-CM | POA: Diagnosis not present

## 2016-03-13 DIAGNOSIS — D509 Iron deficiency anemia, unspecified: Secondary | ICD-10-CM | POA: Diagnosis not present

## 2016-03-13 NOTE — Telephone Encounter (Signed)
Called pt d/t having cancellation on scheduler for tomorrow to see if he could arrive at hospital earlier for Colonoscopy. He requested that I speak to his wife. She needed clarification about clear liquids today and him having dialysis. Advised that he will do clear liquids today instead of eating, then start drinking prep at 4:00pm (he will be done with dialysis by the time he starts prep). Wife ok with arriving at hospital tomorrow at 11:45am for 12:45 procedure. New time instructions given for drinking prep in the morning. Pt to start drinking prep at 7:45am and NPO after 9:45am. Reminded to take enema to hospital. Called and informed Endo scheduler that pt will be arriving earlier.

## 2016-03-14 ENCOUNTER — Encounter (HOSPITAL_COMMUNITY)
Admission: RE | Disposition: A | Discharge status: Home / Self Care | Payer: Self-pay | Source: Ambulatory Visit | Attending: Gastroenterology

## 2016-03-14 ENCOUNTER — Ambulatory Visit (HOSPITAL_COMMUNITY)
Admission: RE | Admit: 2016-03-14 | Discharge: 2016-03-14 | Disposition: A | Discharge status: Home / Self Care | Payer: Medicare Other | Source: Ambulatory Visit | Attending: Gastroenterology | Admitting: Gastroenterology

## 2016-03-14 ENCOUNTER — Ambulatory Visit (HOSPITAL_COMMUNITY): Admission: RE | Admit: 2016-03-14 | Payer: Medicare Other | Source: Ambulatory Visit | Admitting: Gastroenterology

## 2016-03-14 ENCOUNTER — Encounter (HOSPITAL_COMMUNITY): Payer: Self-pay | Admitting: *Deleted

## 2016-03-14 ENCOUNTER — Encounter (HOSPITAL_COMMUNITY): Admission: RE | Payer: Self-pay | Source: Ambulatory Visit

## 2016-03-14 DIAGNOSIS — Z8673 Personal history of transient ischemic attack (TIA), and cerebral infarction without residual deficits: Secondary | ICD-10-CM | POA: Diagnosis not present

## 2016-03-14 DIAGNOSIS — M479 Spondylosis, unspecified: Secondary | ICD-10-CM | POA: Insufficient documentation

## 2016-03-14 DIAGNOSIS — M5136 Other intervertebral disc degeneration, lumbar region: Secondary | ICD-10-CM | POA: Diagnosis not present

## 2016-03-14 DIAGNOSIS — D1339 Benign neoplasm of other parts of small intestine: Secondary | ICD-10-CM | POA: Diagnosis not present

## 2016-03-14 DIAGNOSIS — E785 Hyperlipidemia, unspecified: Secondary | ICD-10-CM | POA: Insufficient documentation

## 2016-03-14 DIAGNOSIS — Z9049 Acquired absence of other specified parts of digestive tract: Secondary | ICD-10-CM | POA: Diagnosis not present

## 2016-03-14 DIAGNOSIS — D122 Benign neoplasm of ascending colon: Secondary | ICD-10-CM | POA: Diagnosis not present

## 2016-03-14 DIAGNOSIS — Z880 Allergy status to penicillin: Secondary | ICD-10-CM | POA: Diagnosis not present

## 2016-03-14 DIAGNOSIS — R197 Diarrhea, unspecified: Secondary | ICD-10-CM

## 2016-03-14 DIAGNOSIS — I4892 Unspecified atrial flutter: Secondary | ICD-10-CM | POA: Diagnosis not present

## 2016-03-14 DIAGNOSIS — D124 Benign neoplasm of descending colon: Secondary | ICD-10-CM | POA: Insufficient documentation

## 2016-03-14 DIAGNOSIS — D126 Benign neoplasm of colon, unspecified: Secondary | ICD-10-CM

## 2016-03-14 DIAGNOSIS — Z940 Kidney transplant status: Secondary | ICD-10-CM | POA: Diagnosis not present

## 2016-03-14 DIAGNOSIS — N186 End stage renal disease: Secondary | ICD-10-CM | POA: Diagnosis not present

## 2016-03-14 DIAGNOSIS — D128 Benign neoplasm of rectum: Secondary | ICD-10-CM | POA: Diagnosis not present

## 2016-03-14 DIAGNOSIS — Z7982 Long term (current) use of aspirin: Secondary | ICD-10-CM | POA: Insufficient documentation

## 2016-03-14 DIAGNOSIS — Q438 Other specified congenital malformations of intestine: Secondary | ICD-10-CM | POA: Insufficient documentation

## 2016-03-14 DIAGNOSIS — K4090 Unilateral inguinal hernia, without obstruction or gangrene, not specified as recurrent: Secondary | ICD-10-CM | POA: Diagnosis not present

## 2016-03-14 DIAGNOSIS — Z8249 Family history of ischemic heart disease and other diseases of the circulatory system: Secondary | ICD-10-CM | POA: Insufficient documentation

## 2016-03-14 DIAGNOSIS — I120 Hypertensive chronic kidney disease with stage 5 chronic kidney disease or end stage renal disease: Secondary | ICD-10-CM | POA: Insufficient documentation

## 2016-03-14 DIAGNOSIS — D120 Benign neoplasm of cecum: Secondary | ICD-10-CM | POA: Insufficient documentation

## 2016-03-14 DIAGNOSIS — K5730 Diverticulosis of large intestine without perforation or abscess without bleeding: Secondary | ICD-10-CM | POA: Insufficient documentation

## 2016-03-14 DIAGNOSIS — Z823 Family history of stroke: Secondary | ICD-10-CM | POA: Insufficient documentation

## 2016-03-14 DIAGNOSIS — K648 Other hemorrhoids: Secondary | ICD-10-CM | POA: Insufficient documentation

## 2016-03-14 DIAGNOSIS — K51418 Inflammatory polyps of colon with other complication: Secondary | ICD-10-CM | POA: Diagnosis not present

## 2016-03-14 DIAGNOSIS — Z91048 Other nonmedicinal substance allergy status: Secondary | ICD-10-CM | POA: Insufficient documentation

## 2016-03-14 DIAGNOSIS — K635 Polyp of colon: Secondary | ICD-10-CM

## 2016-03-14 DIAGNOSIS — K621 Rectal polyp: Secondary | ICD-10-CM | POA: Insufficient documentation

## 2016-03-14 DIAGNOSIS — Z79899 Other long term (current) drug therapy: Secondary | ICD-10-CM | POA: Insufficient documentation

## 2016-03-14 DIAGNOSIS — E1122 Type 2 diabetes mellitus with diabetic chronic kidney disease: Secondary | ICD-10-CM | POA: Diagnosis not present

## 2016-03-14 DIAGNOSIS — Z87891 Personal history of nicotine dependence: Secondary | ICD-10-CM | POA: Diagnosis not present

## 2016-03-14 DIAGNOSIS — Z992 Dependence on renal dialysis: Secondary | ICD-10-CM | POA: Diagnosis not present

## 2016-03-14 DIAGNOSIS — Z841 Family history of disorders of kidney and ureter: Secondary | ICD-10-CM | POA: Insufficient documentation

## 2016-03-14 HISTORY — PX: POLYPECTOMY: SHX5525

## 2016-03-14 HISTORY — PX: COLONOSCOPY: SHX5424

## 2016-03-14 HISTORY — PX: BIOPSY: SHX5522

## 2016-03-14 HISTORY — DX: Unilateral inguinal hernia, without obstruction or gangrene, not specified as recurrent: K40.90

## 2016-03-14 SURGERY — COLONOSCOPY
Anesthesia: Moderate Sedation

## 2016-03-14 MED ORDER — LOPERAMIDE HCL 2 MG PO CAPS: ORAL_CAPSULE | ORAL | 11 refills | Status: DC

## 2016-03-14 MED ORDER — MEPERIDINE HCL 100 MG/ML IJ SOLN
INTRAMUSCULAR | Status: AC
  Filled 2016-03-14: qty 2

## 2016-03-14 MED ORDER — STERILE WATER FOR IRRIGATION IR SOLN
Status: DC | PRN
  Administered 2016-03-14: 13:00:00

## 2016-03-14 MED ORDER — MIDAZOLAM HCL 5 MG/5ML IJ SOLN
INTRAMUSCULAR | Status: DC | PRN
  Administered 2016-03-14: 2 mg via INTRAVENOUS
  Administered 2016-03-14: 1 mg via INTRAVENOUS

## 2016-03-14 MED ORDER — SODIUM CHLORIDE 0.9 % IV SOLN
INTRAVENOUS | Status: DC
  Administered 2016-03-14: 13:00:00 via INTRAVENOUS

## 2016-03-14 MED ORDER — FENTANYL CITRATE (PF) 100 MCG/2ML IJ SOLN
INTRAMUSCULAR | Status: DC | PRN
  Administered 2016-03-14 (×2): 25 ug via INTRAVENOUS

## 2016-03-14 MED ORDER — ATROPINE SULFATE 1 MG/ML IJ SOLN
INTRAMUSCULAR | Status: DC | PRN
  Administered 2016-03-14: .5 mg via INTRAVENOUS

## 2016-03-14 MED ORDER — MIDAZOLAM HCL 5 MG/5ML IJ SOLN
INTRAMUSCULAR | Status: AC
  Filled 2016-03-14: qty 10

## 2016-03-14 MED ORDER — COLESTIPOL HCL 1 G PO TABS: ORAL_TABLET | ORAL | 11 refills | Status: DC

## 2016-03-14 MED ORDER — FENTANYL CITRATE (PF) 100 MCG/2ML IJ SOLN
INTRAMUSCULAR | Status: AC
  Filled 2016-03-14: qty 2

## 2016-03-14 MED ORDER — ATROPINE SULFATE 1 MG/ML IJ SOLN
INTRAMUSCULAR | Status: AC
  Filled 2016-03-14: qty 1

## 2016-03-14 NOTE — Op Note (Signed)
[facility] Patient Name: Wayne Hopkins Procedure Date: 03/14/2016 10:18 AM MRN: [id number] Date of Birth: 1940/06/12 Attending MD: Barney Drain , MD CSN: [id number] Age: 76 Admit Type: Outpatient Procedure:                Colonoscopy WITH COLD FORCEPS/SNARE CAUTERY                            POLYPECTOMY Indications:              Clinically significant diarrhea of unexplained                            origin Providers:                Barney Drain, MD, Otis Peak B. Sharon Seller, RN, Purcell Nails.                            [place], Merchant navy officer Referring MD:             Stoney Bang MD, MD Medicines:                Atropine 0.5 mg IV, Fentanyl 50 micrograms IV,                            Midazolam 3 mg IV Complications:            Bradycardia/BIGEMINY/PAUSE-ATROPINE GIVEN Estimated Blood Loss:     Estimated blood loss was minimal. Procedure:                Pre-Anesthesia Assessment:                           - Prior to the procedure, a History and Physical                            was performed, and patient medications and                            allergies were reviewed. The patient's tolerance of                            previous anesthesia was also reviewed. The risks                            and benefits of the procedure and the sedation                            options and risks were discussed with the patient.                            All questions were answered, and informed consent                            was obtained. Prior Anticoagulants: The patient has  taken aspirin. ASA Grade Assessment: II - A patient                            with mild systemic disease. After reviewing the                            risks and benefits, the patient was deemed in                            satisfactory condition to undergo the procedure.                            After obtaining informed consent, the colonoscope                            was passed under  direct vision. Throughout the                            procedure, the patient's blood pressure, pulse, and                            oxygen saturations were monitored continuously. The                            EC-[id number] ([id number]) scope was introduced through                            the anus and advanced to the 15 cm into the ileum.                            The colonoscopy was performed without difficulty.                            The patient tolerated the procedure well. The                            quality of the bowel preparation was good. The                            terminal ileum, ileocecal valve, appendiceal                            orifice, and rectum were photographed. Scope In: 1:44:06 PM Scope Out: 2:12:41 PM Scope Withdrawal Time: 0 hours 27 minutes 14 seconds  Total Procedure Duration: 0 hours 28 minutes 35 seconds  Findings:      The distal ileum contained one sessile, non-bleeding polyp. The polyp       was 5 mm in diameter. The polyp was removed with a cold biopsy forceps.       Resection and retrieval were complete.      The recto-sigmoid colon was mildly redundant. This was biopsied with a       cold forceps for evaluation of microscopic colitis.      Three sessile polyps were found in the ascending  colon(2) and ileocecal       valve. The polyps were 2 to 3 mm in size. These polyps were removed with       a cold biopsy forceps. Resection and retrieval were complete.      Four sessile polyps were found in the rectum, distal descending colon(3)       and ascending colon. The polyps were 6 to 8 mm in size. These polyps       were removed with a hot snare. Resection and retrieval were complete.      A 6 mm polyp was found in the distal ascending colon. The polyp was       sessile. The polyp was removed with a cold snare. Resection and       retrieval were complete.      A few small-mouthed diverticula were found in the sigmoid colon and       cecum.       Internal hemorrhoids were found during retroflexion. The hemorrhoids       were small.      -AC(4), dTC(3) Impression:               - One ileal polyp in the distal ileum, removed with                            a cold biopsy forceps. Resected and retrieved.                           - Redundant LEFT colon.                           - Three 2 to 3 mm polyps in the ascending colon and                            at the ileocecal valve, removed with a cold biopsy                            forceps. Resected and retrieved.                           - Eight 6 to 8 mm polyps in the rectum(1), in the                            distal descending colon(3) and in the ascending                            colon(4), removed with a hot snare. Resected and                            retrieved.                           - One 6 mm polyp in the distal ascending colon(1),                            removed with a cold snare. Resected and retrieved.                           -  MILD Diverticulosis in the sigmoid colon and in                            the cecum.                           - Internal hemorrhoids. Moderate Sedation:      Moderate (conscious) sedation was administered by the endoscopy nurse       and supervised by the endoscopist. The following parameters were       monitored: oxygen saturation, heart rate, blood pressure, and response       to care. Total physician intraservice time was 44 minutes. Recommendation:           - High fiber diet and low fat diet.                           - Continue present medications.                           - Await pathology results.                           - Repeat colonoscopy 1-3 YEARS for surveillance.                           - Return to GI office in 3 months.                           - Patient has a contact number available for                            emergencies. The signs and symptoms of potential                            delayed  complications were discussed with the                            patient. Return to normal activities tomorrow.                            Written discharge instructions were provided to the                            patient. Procedure Code(s):        --- Professional ---                           [phone], Colonoscopy, flexible; with removal of                            tumor(s), polyp(s), or other lesion(s) by snare                            technique  63785, 59, Colonoscopy, flexible; with biopsy,                            single or multiple                           99152, Moderate sedation services provided by the                            same physician or other qualified health care                            professional performing the diagnostic or                            therapeutic service that the sedation supports,                            requiring the presence of an independent trained                            observer to assist in the monitoring of the                            patient's level of consciousness and physiological                            status; initial 15 minutes of intraservice time,                            patient age 30 years or older                           [phone], Moderate sedation services; each additional                            15 minutes intraservice time                           99153, Moderate sedation services; each additional                            15 minutes intraservice time Diagnosis Code(s):        --- Professional ---                           D13.39, Benign neoplasm of other parts of small                            intestine                           D12.0, Benign neoplasm of cecum                           K62.1, Rectal polyp  D12.4, Benign neoplasm of descending colon                           D12.2, Benign neoplasm of ascending colon                            K64.8, Other hemorrhoids                           R19.7, Diarrhea, unspecified                           K57.30, Diverticulosis of large intestine without                            perforation or abscess without bleeding                           Q43.8, Other specified congenital malformations of                            intestine CPT copyright 2016 [employer]. All rights reserved. The codes documented in this report are preliminary and upon coder review may  be revised to meet current compliance requirements. Barney Drain, MD Barney Drain, MD 03/14/2016 2:40:28 PM This report has been signed electronically. Number of Addenda: 0

## 2016-03-14 NOTE — H&P (Signed)
Primary Care Physician:  Neale Burly, MD Primary Gastroenterologist:  Dr. Oneida Alar  Pre-Procedure History & Physical: HPI:  Wayne Hopkins is a 76 y.o. male here for  DIARRHEA.  Past Medical History:  Diagnosis Date  . Atrial flutter ([place])   . DDD (degenerative disc disease), lumbar   . Diabetes mellitus without complication ([place])   . ESRD (end stage renal disease) ([place])   . Hernia, inguinal, right   . Hyperlipidemia   . Hypertension   . Long-term use of immunosuppressant medication   . Lumbar spondylosis   . Peripheral edema   . Stroke [facility])     Past Surgical History:  Procedure Laterality Date  . BACK SURGERY    . CHOLECYSTECTOMY    . KIDNEY TRANSPLANT  2005    Prior to Admission medications   Medication Sig Start Date End Date Taking? Authorizing Provider  aspirin EC 81 MG tablet Take 81 mg by mouth daily. 11/03/14  Yes Historical Provider, MD  Cholecalciferol (VITAMIN D-1000 MAX ST) 1000 units tablet Take 2,000 Units by mouth daily.  05/24/15  Yes Historical Provider, MD  loperamide (IMODIUM) 2 MG capsule TAKE 1 CAPSULE 4 TIMES A DAY AS NEEDED FOR DIARRHEA OR LOOSE STOOL. Patient taking differently: TAKE 1 CAPSULE EVERY 4 HOURS AS NEEDED FOR DIARRHEA OR LOOSE STOOL. 02/22/16  Yes Annitta Needs, NP  midodrine (PROAMATINE) 10 MG tablet Take 10 mg by mouth. Take 15 mins before dialysis, 3 times per week to stabilize blood pressure prior to dialysis   Yes Historical Provider, MD  Omega-3 Fatty Acids (FISH OIL) 1200 MG CAPS Take 1,200 mg by mouth 2 (two) times daily.    Yes Historical Provider, MD  polyethylene glycol-electrolytes (TRILYTE) 420 g solution Take 4,000 mLs by mouth as directed. 02/17/16  Yes Danie Binder, MD  predniSONE (DELTASONE) 10 MG tablet Take 5 mg by mouth every other day.  09/28/15  Yes Historical Provider, MD  sevelamer carbonate (RENVELA) 800 MG tablet Take 2,400 mg by mouth 3 (three) times daily with meals. Takes [phone] with snacks   Yes Historical  Provider, MD  sodium bicarbonate 650 MG tablet Take 1,300 mg by mouth 2 (two) times daily. 12/03/13  Yes Historical Provider, MD  colestipol (COLESTID) 1 g tablet TAKE (1) TABLET TWICE DAILY. 02/23/16   Annitta Needs, NP    Allergies as of 03/10/2016 - Review Complete 03/10/2016  Allergen Reaction Noted  . Penicillins Swelling 01/13/2016  . Tape Other (See Comments) 01/13/2016    Family History  Problem Relation Age of Onset  . Stroke Mother   . Heart attack Father   . Kidney failure Sister   . Kidney failure Other     Social History   Social History  . Marital status: Married    Spouse name: N/A  . Number of children: N/A  . Years of education: N/A   Occupational History  . Not on file.   Social History Main Topics  . Smoking status: Former Smoker    Packs/day: 1.50    Types: Cigarettes    Quit date: 01/23/1984  . Smokeless tobacco: Never Used     Comment: Quit x 33 years  . Alcohol use No  . Drug use: No  . Sexual activity: Not on file   Other Topics Concern  . Not on file   Social History Narrative  . No narrative on file    Review of Systems: See HPI, otherwise negative ROS   Physical  Exam: BP (!) 130/55   Pulse (!) 50   Temp 97.9 F (36.6 C) (Oral)   Resp 14   Ht 5\' 10"  (1.778 m)   Wt 156 lb (70.8 kg)   SpO2 100%   BMI 22.38 kg/m  General:   Alert,  pleasant and cooperative in NAD Head:  Normocephalic and atraumatic. Neck:  Supple; Lungs:  Clear throughout to auscultation.    Heart:  Regular rate and rhythm. Abdomen:  Soft, nontender and nondistended. Normal bowel sounds, without guarding, and without rebound.   Neurologic:  Alert and  oriented x4;  grossly normal neurologically.  Impression/Plan:     Diarrhea  PLAN: TCS TODAY WITH BIOPSY. DISCUSSED PROCEDURE, BENEFITS, & RISKS: < 1% chance of medication reaction, bleeding, perforation, or rupture of spleen/liver.

## 2016-03-15 DIAGNOSIS — N2581 Secondary hyperparathyroidism of renal origin: Secondary | ICD-10-CM | POA: Diagnosis not present

## 2016-03-15 DIAGNOSIS — D631 Anemia in chronic kidney disease: Secondary | ICD-10-CM | POA: Diagnosis not present

## 2016-03-15 DIAGNOSIS — D509 Iron deficiency anemia, unspecified: Secondary | ICD-10-CM | POA: Diagnosis not present

## 2016-03-15 DIAGNOSIS — N186 End stage renal disease: Secondary | ICD-10-CM | POA: Diagnosis not present

## 2016-03-15 DIAGNOSIS — Z992 Dependence on renal dialysis: Secondary | ICD-10-CM | POA: Diagnosis not present

## 2016-03-17 DIAGNOSIS — D631 Anemia in chronic kidney disease: Secondary | ICD-10-CM | POA: Diagnosis not present

## 2016-03-17 DIAGNOSIS — D509 Iron deficiency anemia, unspecified: Secondary | ICD-10-CM | POA: Diagnosis not present

## 2016-03-17 DIAGNOSIS — Z992 Dependence on renal dialysis: Secondary | ICD-10-CM | POA: Diagnosis not present

## 2016-03-17 DIAGNOSIS — N2581 Secondary hyperparathyroidism of renal origin: Secondary | ICD-10-CM | POA: Diagnosis not present

## 2016-03-17 DIAGNOSIS — N186 End stage renal disease: Secondary | ICD-10-CM | POA: Diagnosis not present

## 2016-03-20 DIAGNOSIS — N186 End stage renal disease: Secondary | ICD-10-CM | POA: Diagnosis not present

## 2016-03-20 DIAGNOSIS — D631 Anemia in chronic kidney disease: Secondary | ICD-10-CM | POA: Diagnosis not present

## 2016-03-20 DIAGNOSIS — D509 Iron deficiency anemia, unspecified: Secondary | ICD-10-CM | POA: Diagnosis not present

## 2016-03-20 DIAGNOSIS — Z992 Dependence on renal dialysis: Secondary | ICD-10-CM | POA: Diagnosis not present

## 2016-03-20 DIAGNOSIS — N2581 Secondary hyperparathyroidism of renal origin: Secondary | ICD-10-CM | POA: Diagnosis not present

## 2016-03-22 ENCOUNTER — Encounter (HOSPITAL_COMMUNITY): Payer: Self-pay | Admitting: Gastroenterology

## 2016-03-22 DIAGNOSIS — N2581 Secondary hyperparathyroidism of renal origin: Secondary | ICD-10-CM | POA: Diagnosis not present

## 2016-03-22 DIAGNOSIS — D509 Iron deficiency anemia, unspecified: Secondary | ICD-10-CM | POA: Diagnosis not present

## 2016-03-22 DIAGNOSIS — D631 Anemia in chronic kidney disease: Secondary | ICD-10-CM | POA: Diagnosis not present

## 2016-03-22 DIAGNOSIS — Z992 Dependence on renal dialysis: Secondary | ICD-10-CM | POA: Diagnosis not present

## 2016-03-22 DIAGNOSIS — N186 End stage renal disease: Secondary | ICD-10-CM | POA: Diagnosis not present

## 2016-03-24 DIAGNOSIS — E162 Hypoglycemia, unspecified: Secondary | ICD-10-CM | POA: Diagnosis not present

## 2016-03-24 DIAGNOSIS — D631 Anemia in chronic kidney disease: Secondary | ICD-10-CM | POA: Diagnosis not present

## 2016-03-24 DIAGNOSIS — Z992 Dependence on renal dialysis: Secondary | ICD-10-CM | POA: Diagnosis not present

## 2016-03-24 DIAGNOSIS — N2581 Secondary hyperparathyroidism of renal origin: Secondary | ICD-10-CM | POA: Diagnosis not present

## 2016-03-24 DIAGNOSIS — D509 Iron deficiency anemia, unspecified: Secondary | ICD-10-CM | POA: Diagnosis not present

## 2016-03-24 DIAGNOSIS — E11649 Type 2 diabetes mellitus with hypoglycemia without coma: Secondary | ICD-10-CM | POA: Diagnosis not present

## 2016-03-24 DIAGNOSIS — Z23 Encounter for immunization: Secondary | ICD-10-CM | POA: Diagnosis not present

## 2016-03-24 DIAGNOSIS — N186 End stage renal disease: Secondary | ICD-10-CM | POA: Diagnosis not present

## 2016-03-27 DIAGNOSIS — N186 End stage renal disease: Secondary | ICD-10-CM | POA: Diagnosis not present

## 2016-03-27 DIAGNOSIS — D509 Iron deficiency anemia, unspecified: Secondary | ICD-10-CM | POA: Diagnosis not present

## 2016-03-27 DIAGNOSIS — D631 Anemia in chronic kidney disease: Secondary | ICD-10-CM | POA: Diagnosis not present

## 2016-03-27 DIAGNOSIS — N2581 Secondary hyperparathyroidism of renal origin: Secondary | ICD-10-CM | POA: Diagnosis not present

## 2016-03-27 DIAGNOSIS — E162 Hypoglycemia, unspecified: Secondary | ICD-10-CM | POA: Diagnosis not present

## 2016-03-27 DIAGNOSIS — Z23 Encounter for immunization: Secondary | ICD-10-CM | POA: Diagnosis not present

## 2016-03-28 ENCOUNTER — Telehealth: Payer: Self-pay | Admitting: Gastroenterology

## 2016-03-28 DIAGNOSIS — I10 Essential (primary) hypertension: Secondary | ICD-10-CM | POA: Diagnosis not present

## 2016-03-28 DIAGNOSIS — N185 Chronic kidney disease, stage 5: Secondary | ICD-10-CM | POA: Diagnosis not present

## 2016-03-28 DIAGNOSIS — E1121 Type 2 diabetes mellitus with diabetic nephropathy: Secondary | ICD-10-CM | POA: Diagnosis not present

## 2016-03-28 NOTE — Telephone Encounter (Signed)
PT is aware.

## 2016-03-28 NOTE — Telephone Encounter (Signed)
APPT MADE AND ON RECALL  °

## 2016-03-28 NOTE — Telephone Encounter (Signed)
Please call pt. She had a FOURS simple adenomas and five INFLAMMATORY POLYPS REMOVED removed. HIS colon and small bowel biopsies are normal.   CONTINUE COLESTID TWICE DAILY TO PREVENT DIARRHEA.  USE IMODIUM UP TO 4 PILLS A DAYS IF NEEDED TO CONTROL DIARRHEA. ADD LACTASE 3 PILLS WITH MEALS THREE TIMES A DAY to prevent diarrhea.  FOLLOW A LOW FAT/HIGH FIBER DIET. MEATS SHOULD BE BAKED, BROILED, OR BOILED. AVOID FRIED FOODS.  If you eat a high fat food(BACON, HOG JAWS), YOU SHOULD CHEW ONE TUMS. NO MORE THAN 2 TUMS A DAY.  FOLLOW UP IN 3 MOS E30 DIARRHEA.  NEXT COLONOSCOPY IN 3 YEARS IF THE BENEFITS OUTWEIGH THE RISKS.

## 2016-03-28 NOTE — Discharge Instructions (Signed)
NO OBVIOUS SOURCE FOR YOUR DIARRHEA WAS IDENTIFIED. You had NINE polyps removed. You have small internal hemorrhoids and diverticulosis IN YOUR RIGHT AND LEFT COLON. I BIOPSIED YOUR RIGHT COLON. THE LAST PART OF YOUR SMALL BOWEL IS NORMAL.   CONTINUE COLESTID TWICE DAILY TO PREVENT DIARRHEA.   USE IMODIUM UP TO 4 PILLS A DAYS IF NEEDED TO CONTROL DIARRHEA.  ADD LACTASE 3 PILLS WITH MEALS THREE TIMES A DAY to prevent diarrhea.   FOLLOW A LOW FAT/HIGH FIBER DIET. MEATS SHOULD BE BAKED, BROILED, OR BOILED. AVOID FRIED FOODS. SEE INFO BELOW.  If you eat a high fat food(BACON, HOG JAWS), YOU SHOULD CHEW ONE TUMS. NO MORE THAN 2 TUMS A DAY.   YOUR BIOPSY RESULTS WILL BE AVAILABLE IN MY CHART AFTER FEB 25 AND MY OFFICE WILL CONTACT YOU IN 10-14 DAYS WITH YOUR RESULTS.   FOLLOW UP IN 3 MOS.    Next colonoscopy in 1-3 years.   Colonoscopy Care After Read the instructions outlined below and refer to this sheet in the next week. These discharge instructions provide you with general information on caring for yourself after you leave the hospital. While your treatment has been planned according to the most current medical practices available, unavoidable complications occasionally occur. If you have any problems or questions after discharge, call DR. Cashmere Harmes, [phone].  ACTIVITY  You may resume your regular activity, but move at a slower pace for the next 24 hours.   Take frequent rest periods for the next 24 hours.   Walking will help get rid of the air and reduce the bloated feeling in your belly (abdomen).   No driving for 24 hours (because of the medicine (anesthesia) used during the test).   You may shower.   Do not sign any important legal documents or operate any machinery for 24 hours (because of the anesthesia used during the test).    NUTRITION  Drink plenty of fluids.   You may resume your normal diet as instructed by your doctor.   Begin with a light meal and  progress to your normal diet. Heavy or fried foods are harder to digest and may make you feel sick to your stomach (nauseated).   Avoid alcoholic beverages for 24 hours or as instructed.    MEDICATIONS  You may resume your normal medications.   WHAT YOU CAN EXPECT TODAY  Some feelings of bloating in the abdomen.   Passage of more gas than usual.   Spotting of blood in your stool or on the toilet paper  .  IF YOU HAD POLYPS REMOVED DURING THE COLONOSCOPY:  Eat a soft diet IF YOU HAVE NAUSEA, BLOATING, ABDOMINAL PAIN, OR VOMITING.    FINDING OUT THE RESULTS OF YOUR TEST Not all test results are available during your visit. DR. Oneida Alar WILL CALL YOU WITHIN 14 DAYS OF YOUR PROCEDUE WITH YOUR RESULTS. Do not assume everything is normal if you have not heard from DR. Genie Wenke, CALL HER OFFICE AT [phone].  SEEK IMMEDIATE MEDICAL ATTENTION AND CALL THE OFFICE: [phone] IF:  You have more than a spotting of blood in your stool.   Your belly is swollen (abdominal distention).   You are nauseated or vomiting.   You have a temperature over 101F.   You have abdominal pain or discomfort that is severe or gets worse throughout the day.  Polyps, Colon  A polyp is extra tissue that grows inside your body. Colon polyps grow in the large intestine. The large intestine,  also called the colon, is part of your digestive system. It is a long, hollow tube at the end of your digestive tract where your body makes and stores stool. Most polyps are not dangerous. They are benign. This means they are not cancerous. But over time, some types of polyps can turn into cancer. Polyps that are smaller than a pea are usually not harmful. But larger polyps could someday become or may already be cancerous. To be safe, doctors remove all polyps and test them.   PREVENTION There is not one sure way to prevent polyps. You might be able to lower your risk of getting them if you:  Eat more fruits and  vegetables and less fatty food.   Do not smoke.   Avoid alcohol.   Exercise every day.   Lose weight if you are overweight.   Eating more calcium and folate can also lower your risk of getting polyps. Some foods that are rich in calcium are milk, cheese, and broccoli. Some foods that are rich in folate are chickpeas, kidney beans, and spinach.   High-Fiber Diet A high-fiber diet changes your normal diet to include more whole grains, legumes, fruits, and vegetables. Changes in the diet involve replacing refined carbohydrates with unrefined foods. The calorie level of the diet is essentially unchanged. The Dietary Reference Intake (recommended amount) for adult males is 38 grams per day. For adult females, it is 25 grams per day. Pregnant and lactating women should consume 28 grams of fiber per day. Fiber is the intact part of a plant that is not broken down during digestion. Functional fiber is fiber that has been isolated from the plant to provide a beneficial effect in the body. PURPOSE  Increase stool bulk.   Ease and regulate bowel movements.   Lower cholesterol.   REDUCE RISK OF COLON CANCER  INDICATIONS THAT YOU NEED MORE FIBER  Constipation and hemorrhoids.   Uncomplicated diverticulosis (intestine condition) and irritable bowel syndrome.   Weight management.   As a protective measure against hardening of the arteries (atherosclerosis), diabetes, and cancer.   GUIDELINES FOR INCREASING FIBER IN THE DIET  Start adding fiber to the diet slowly. A gradual increase of about 5 more grams (2 slices of whole-wheat bread, 2 servings of most fruits or vegetables, or 1 bowl of high-fiber cereal) per day is best. Too rapid an increase in fiber may result in constipation, flatulence, and bloating.   Drink enough water and fluids to keep your urine clear or pale yellow. Water, juice, or caffeine-free drinks are recommended. Not drinking enough fluid may cause constipation.   Eat a  variety of high-fiber foods rather than one type of fiber.   Try to increase your intake of fiber through using high-fiber foods rather than fiber pills or supplements that contain small amounts of fiber.   The goal is to change the types of food eaten. Do not supplement your present diet with high-fiber foods, but replace foods in your present diet.   INCLUDE A VARIETY OF FIBER SOURCES  Replace refined and processed grains with whole grains, canned fruits with fresh fruits, and incorporate other fiber sources. White rice, white breads, and most bakery goods contain little or no fiber.   Brown whole-grain rice, buckwheat oats, and many fruits and vegetables are all good sources of fiber. These include: broccoli, Brussels sprouts, cabbage, cauliflower, beets, sweet potatoes, white potatoes (skin on), carrots, tomatoes, eggplant, squash, berries, fresh fruits, and dried fruits.   Cereals appear  to be the richest source of fiber. Cereal fiber is found in whole grains and bran. Bran is the fiber-rich outer coat of cereal grain, which is largely removed in refining. In whole-grain cereals, the bran remains. In breakfast cereals, the largest amount of fiber is found in those with "bran" in their names. The fiber content is sometimes indicated on the label.   You may need to include additional fruits and vegetables each day.   In baking, for 1 cup white flour, you may use the following substitutions:   1 cup whole-wheat flour minus 2 tablespoons.   1/2 cup white flour plus 1/2 cup whole-wheat flour.   Diverticulosis Diverticulosis is a common condition that develops when small pouches (diverticula) form in the wall of the colon. The risk of diverticulosis increases with age. It happens more often in people who eat a low-fiber diet. Most individuals with diverticulosis have no symptoms. Those individuals with symptoms usually experience belly (abdominal) pain, constipation, or loose stools  (diarrhea).  HOME CARE INSTRUCTIONS  Increase the amount of fiber in your diet as directed by your caregiver or dietician. This may reduce symptoms of diverticulosis.   Drink at least 6 to 8 glasses of water each day to prevent constipation.   Try not to strain when you have a bowel movement.   Avoiding nuts and seeds to prevent complications is NOT NECESSARY.   FOODS HAVING HIGH FIBER CONTENT INCLUDE:  Fruits. Apple, peach, pear, tangerine, raisins, prunes.   Vegetables. Brussels sprouts, asparagus, broccoli, cabbage, carrot, cauliflower, romaine lettuce, spinach, summer squash, tomato, winter squash, zucchini.   Starchy Vegetables. Baked beans, kidney beans, lima beans, split peas, lentils, potatoes (with skin).   Grains. Whole wheat bread, brown rice, bran flake cereal, plain oatmeal, white rice, shredded wheat, bran muffins.   SEEK IMMEDIATE MEDICAL CARE IF:  You develop increasing pain or severe bloating.   You have an oral temperature above 101F.   You develop vomiting or bowel movements that are bloody or black.   Hemorrhoids Hemorrhoids are dilated (enlarged) veins around the rectum. Sometimes clots will form in the veins. This makes them swollen and painful. These are called thrombosed hemorrhoids. Causes of hemorrhoids include:  Constipation.   Straining to have a bowel movement.   HEAVY LIFTING  HOME CARE INSTRUCTIONS  Eat a well balanced diet and drink 6 to 8 glasses of water every day to avoid constipation. You may also use a bulk laxative.   Avoid straining to have bowel movements.   Keep anal area dry and clean.   Do not use a donut shaped pillow or sit on the toilet for long periods. This increases blood pooling and pain.   Move your bowels when your body has the urge; this will require less straining and will decrease pain and pressure.

## 2016-03-29 DIAGNOSIS — E162 Hypoglycemia, unspecified: Secondary | ICD-10-CM | POA: Diagnosis not present

## 2016-03-29 DIAGNOSIS — D631 Anemia in chronic kidney disease: Secondary | ICD-10-CM | POA: Diagnosis not present

## 2016-03-29 DIAGNOSIS — N2581 Secondary hyperparathyroidism of renal origin: Secondary | ICD-10-CM | POA: Diagnosis not present

## 2016-03-29 DIAGNOSIS — N186 End stage renal disease: Secondary | ICD-10-CM | POA: Diagnosis not present

## 2016-03-29 DIAGNOSIS — D509 Iron deficiency anemia, unspecified: Secondary | ICD-10-CM | POA: Diagnosis not present

## 2016-03-29 DIAGNOSIS — Z23 Encounter for immunization: Secondary | ICD-10-CM | POA: Diagnosis not present

## 2016-03-31 DIAGNOSIS — N2581 Secondary hyperparathyroidism of renal origin: Secondary | ICD-10-CM | POA: Diagnosis not present

## 2016-03-31 DIAGNOSIS — Z23 Encounter for immunization: Secondary | ICD-10-CM | POA: Diagnosis not present

## 2016-03-31 DIAGNOSIS — D509 Iron deficiency anemia, unspecified: Secondary | ICD-10-CM | POA: Diagnosis not present

## 2016-03-31 DIAGNOSIS — D631 Anemia in chronic kidney disease: Secondary | ICD-10-CM | POA: Diagnosis not present

## 2016-03-31 DIAGNOSIS — E162 Hypoglycemia, unspecified: Secondary | ICD-10-CM | POA: Diagnosis not present

## 2016-03-31 DIAGNOSIS — N186 End stage renal disease: Secondary | ICD-10-CM | POA: Diagnosis not present

## 2016-04-03 DIAGNOSIS — D631 Anemia in chronic kidney disease: Secondary | ICD-10-CM | POA: Diagnosis not present

## 2016-04-03 DIAGNOSIS — N2581 Secondary hyperparathyroidism of renal origin: Secondary | ICD-10-CM | POA: Diagnosis not present

## 2016-04-03 DIAGNOSIS — E162 Hypoglycemia, unspecified: Secondary | ICD-10-CM | POA: Diagnosis not present

## 2016-04-03 DIAGNOSIS — Z23 Encounter for immunization: Secondary | ICD-10-CM | POA: Diagnosis not present

## 2016-04-03 DIAGNOSIS — N186 End stage renal disease: Secondary | ICD-10-CM | POA: Diagnosis not present

## 2016-04-03 DIAGNOSIS — D509 Iron deficiency anemia, unspecified: Secondary | ICD-10-CM | POA: Diagnosis not present

## 2016-04-05 DIAGNOSIS — E162 Hypoglycemia, unspecified: Secondary | ICD-10-CM | POA: Diagnosis not present

## 2016-04-05 DIAGNOSIS — N2581 Secondary hyperparathyroidism of renal origin: Secondary | ICD-10-CM | POA: Diagnosis not present

## 2016-04-05 DIAGNOSIS — Z23 Encounter for immunization: Secondary | ICD-10-CM | POA: Diagnosis not present

## 2016-04-05 DIAGNOSIS — N186 End stage renal disease: Secondary | ICD-10-CM | POA: Diagnosis not present

## 2016-04-05 DIAGNOSIS — D631 Anemia in chronic kidney disease: Secondary | ICD-10-CM | POA: Diagnosis not present

## 2016-04-05 DIAGNOSIS — D509 Iron deficiency anemia, unspecified: Secondary | ICD-10-CM | POA: Diagnosis not present

## 2016-04-07 DIAGNOSIS — Z23 Encounter for immunization: Secondary | ICD-10-CM | POA: Diagnosis not present

## 2016-04-07 DIAGNOSIS — N186 End stage renal disease: Secondary | ICD-10-CM | POA: Diagnosis not present

## 2016-04-07 DIAGNOSIS — D509 Iron deficiency anemia, unspecified: Secondary | ICD-10-CM | POA: Diagnosis not present

## 2016-04-07 DIAGNOSIS — E162 Hypoglycemia, unspecified: Secondary | ICD-10-CM | POA: Diagnosis not present

## 2016-04-07 DIAGNOSIS — N2581 Secondary hyperparathyroidism of renal origin: Secondary | ICD-10-CM | POA: Diagnosis not present

## 2016-04-07 DIAGNOSIS — D631 Anemia in chronic kidney disease: Secondary | ICD-10-CM | POA: Diagnosis not present

## 2016-04-10 DIAGNOSIS — D631 Anemia in chronic kidney disease: Secondary | ICD-10-CM | POA: Diagnosis not present

## 2016-04-10 DIAGNOSIS — N186 End stage renal disease: Secondary | ICD-10-CM | POA: Diagnosis not present

## 2016-04-10 DIAGNOSIS — E162 Hypoglycemia, unspecified: Secondary | ICD-10-CM | POA: Diagnosis not present

## 2016-04-10 DIAGNOSIS — N2581 Secondary hyperparathyroidism of renal origin: Secondary | ICD-10-CM | POA: Diagnosis not present

## 2016-04-10 DIAGNOSIS — D509 Iron deficiency anemia, unspecified: Secondary | ICD-10-CM | POA: Diagnosis not present

## 2016-04-10 DIAGNOSIS — Z23 Encounter for immunization: Secondary | ICD-10-CM | POA: Diagnosis not present

## 2016-04-12 DIAGNOSIS — Z23 Encounter for immunization: Secondary | ICD-10-CM | POA: Diagnosis not present

## 2016-04-12 DIAGNOSIS — E162 Hypoglycemia, unspecified: Secondary | ICD-10-CM | POA: Diagnosis not present

## 2016-04-12 DIAGNOSIS — D509 Iron deficiency anemia, unspecified: Secondary | ICD-10-CM | POA: Diagnosis not present

## 2016-04-12 DIAGNOSIS — N2581 Secondary hyperparathyroidism of renal origin: Secondary | ICD-10-CM | POA: Diagnosis not present

## 2016-04-12 DIAGNOSIS — N186 End stage renal disease: Secondary | ICD-10-CM | POA: Diagnosis not present

## 2016-04-12 DIAGNOSIS — D631 Anemia in chronic kidney disease: Secondary | ICD-10-CM | POA: Diagnosis not present

## 2016-04-14 DIAGNOSIS — D631 Anemia in chronic kidney disease: Secondary | ICD-10-CM | POA: Diagnosis not present

## 2016-04-14 DIAGNOSIS — D509 Iron deficiency anemia, unspecified: Secondary | ICD-10-CM | POA: Diagnosis not present

## 2016-04-14 DIAGNOSIS — N186 End stage renal disease: Secondary | ICD-10-CM | POA: Diagnosis not present

## 2016-04-14 DIAGNOSIS — N2581 Secondary hyperparathyroidism of renal origin: Secondary | ICD-10-CM | POA: Diagnosis not present

## 2016-04-14 DIAGNOSIS — E162 Hypoglycemia, unspecified: Secondary | ICD-10-CM | POA: Diagnosis not present

## 2016-04-14 DIAGNOSIS — Z23 Encounter for immunization: Secondary | ICD-10-CM | POA: Diagnosis not present

## 2016-04-17 DIAGNOSIS — E162 Hypoglycemia, unspecified: Secondary | ICD-10-CM | POA: Diagnosis not present

## 2016-04-17 DIAGNOSIS — D509 Iron deficiency anemia, unspecified: Secondary | ICD-10-CM | POA: Diagnosis not present

## 2016-04-17 DIAGNOSIS — Z794 Long term (current) use of insulin: Secondary | ICD-10-CM | POA: Diagnosis not present

## 2016-04-17 DIAGNOSIS — Z992 Dependence on renal dialysis: Secondary | ICD-10-CM | POA: Diagnosis not present

## 2016-04-17 DIAGNOSIS — D631 Anemia in chronic kidney disease: Secondary | ICD-10-CM | POA: Diagnosis not present

## 2016-04-17 DIAGNOSIS — N186 End stage renal disease: Secondary | ICD-10-CM | POA: Diagnosis not present

## 2016-04-17 DIAGNOSIS — N2581 Secondary hyperparathyroidism of renal origin: Secondary | ICD-10-CM | POA: Diagnosis not present

## 2016-04-17 DIAGNOSIS — Z23 Encounter for immunization: Secondary | ICD-10-CM | POA: Diagnosis not present

## 2016-04-17 DIAGNOSIS — E119 Type 2 diabetes mellitus without complications: Secondary | ICD-10-CM | POA: Diagnosis not present

## 2016-04-19 DIAGNOSIS — Z23 Encounter for immunization: Secondary | ICD-10-CM | POA: Diagnosis not present

## 2016-04-19 DIAGNOSIS — N186 End stage renal disease: Secondary | ICD-10-CM | POA: Diagnosis not present

## 2016-04-19 DIAGNOSIS — D631 Anemia in chronic kidney disease: Secondary | ICD-10-CM | POA: Diagnosis not present

## 2016-04-19 DIAGNOSIS — D509 Iron deficiency anemia, unspecified: Secondary | ICD-10-CM | POA: Diagnosis not present

## 2016-04-19 DIAGNOSIS — E162 Hypoglycemia, unspecified: Secondary | ICD-10-CM | POA: Diagnosis not present

## 2016-04-19 DIAGNOSIS — N2581 Secondary hyperparathyroidism of renal origin: Secondary | ICD-10-CM | POA: Diagnosis not present

## 2016-04-21 DIAGNOSIS — D631 Anemia in chronic kidney disease: Secondary | ICD-10-CM | POA: Diagnosis not present

## 2016-04-21 DIAGNOSIS — E162 Hypoglycemia, unspecified: Secondary | ICD-10-CM | POA: Diagnosis not present

## 2016-04-21 DIAGNOSIS — Z23 Encounter for immunization: Secondary | ICD-10-CM | POA: Diagnosis not present

## 2016-04-21 DIAGNOSIS — N2581 Secondary hyperparathyroidism of renal origin: Secondary | ICD-10-CM | POA: Diagnosis not present

## 2016-04-21 DIAGNOSIS — N186 End stage renal disease: Secondary | ICD-10-CM | POA: Diagnosis not present

## 2016-04-21 DIAGNOSIS — D509 Iron deficiency anemia, unspecified: Secondary | ICD-10-CM | POA: Diagnosis not present

## 2016-04-22 DIAGNOSIS — Z992 Dependence on renal dialysis: Secondary | ICD-10-CM | POA: Diagnosis not present

## 2016-04-22 DIAGNOSIS — N186 End stage renal disease: Secondary | ICD-10-CM | POA: Diagnosis not present

## 2016-04-24 DIAGNOSIS — D631 Anemia in chronic kidney disease: Secondary | ICD-10-CM | POA: Diagnosis not present

## 2016-04-24 DIAGNOSIS — E873 Alkalosis: Secondary | ICD-10-CM | POA: Diagnosis not present

## 2016-04-24 DIAGNOSIS — Z940 Kidney transplant status: Secondary | ICD-10-CM | POA: Diagnosis not present

## 2016-04-24 DIAGNOSIS — E162 Hypoglycemia, unspecified: Secondary | ICD-10-CM | POA: Diagnosis not present

## 2016-04-24 DIAGNOSIS — D509 Iron deficiency anemia, unspecified: Secondary | ICD-10-CM | POA: Diagnosis not present

## 2016-04-24 DIAGNOSIS — N2581 Secondary hyperparathyroidism of renal origin: Secondary | ICD-10-CM | POA: Diagnosis not present

## 2016-04-24 DIAGNOSIS — N186 End stage renal disease: Secondary | ICD-10-CM | POA: Diagnosis not present

## 2016-04-24 DIAGNOSIS — Z992 Dependence on renal dialysis: Secondary | ICD-10-CM | POA: Diagnosis not present

## 2016-04-24 DIAGNOSIS — Z7982 Long term (current) use of aspirin: Secondary | ICD-10-CM | POA: Diagnosis not present

## 2016-04-24 DIAGNOSIS — R404 Transient alteration of awareness: Secondary | ICD-10-CM | POA: Diagnosis not present

## 2016-04-24 DIAGNOSIS — Z79899 Other long term (current) drug therapy: Secondary | ICD-10-CM | POA: Diagnosis not present

## 2016-04-24 DIAGNOSIS — Z8673 Personal history of transient ischemic attack (TIA), and cerebral infarction without residual deficits: Secondary | ICD-10-CM | POA: Diagnosis not present

## 2016-04-24 DIAGNOSIS — E11649 Type 2 diabetes mellitus with hypoglycemia without coma: Secondary | ICD-10-CM | POA: Diagnosis not present

## 2016-04-24 DIAGNOSIS — E119 Type 2 diabetes mellitus without complications: Secondary | ICD-10-CM | POA: Diagnosis not present

## 2016-04-24 DIAGNOSIS — R531 Weakness: Secondary | ICD-10-CM | POA: Diagnosis not present

## 2016-04-26 DIAGNOSIS — N186 End stage renal disease: Secondary | ICD-10-CM | POA: Diagnosis not present

## 2016-04-26 DIAGNOSIS — E162 Hypoglycemia, unspecified: Secondary | ICD-10-CM | POA: Diagnosis not present

## 2016-04-26 DIAGNOSIS — D509 Iron deficiency anemia, unspecified: Secondary | ICD-10-CM | POA: Diagnosis not present

## 2016-04-26 DIAGNOSIS — D631 Anemia in chronic kidney disease: Secondary | ICD-10-CM | POA: Diagnosis not present

## 2016-04-26 DIAGNOSIS — N2581 Secondary hyperparathyroidism of renal origin: Secondary | ICD-10-CM | POA: Diagnosis not present

## 2016-04-26 DIAGNOSIS — Z992 Dependence on renal dialysis: Secondary | ICD-10-CM | POA: Diagnosis not present

## 2016-04-27 DIAGNOSIS — I10 Essential (primary) hypertension: Secondary | ICD-10-CM | POA: Diagnosis not present

## 2016-04-27 DIAGNOSIS — H6122 Impacted cerumen, left ear: Secondary | ICD-10-CM | POA: Diagnosis not present

## 2016-04-27 DIAGNOSIS — M25511 Pain in right shoulder: Secondary | ICD-10-CM | POA: Diagnosis not present

## 2016-04-27 DIAGNOSIS — E1121 Type 2 diabetes mellitus with diabetic nephropathy: Secondary | ICD-10-CM | POA: Diagnosis not present

## 2016-04-27 DIAGNOSIS — N185 Chronic kidney disease, stage 5: Secondary | ICD-10-CM | POA: Diagnosis not present

## 2016-04-28 DIAGNOSIS — N186 End stage renal disease: Secondary | ICD-10-CM | POA: Diagnosis not present

## 2016-04-28 DIAGNOSIS — N2581 Secondary hyperparathyroidism of renal origin: Secondary | ICD-10-CM | POA: Diagnosis not present

## 2016-04-28 DIAGNOSIS — Z992 Dependence on renal dialysis: Secondary | ICD-10-CM | POA: Diagnosis not present

## 2016-04-28 DIAGNOSIS — D631 Anemia in chronic kidney disease: Secondary | ICD-10-CM | POA: Diagnosis not present

## 2016-04-28 DIAGNOSIS — E162 Hypoglycemia, unspecified: Secondary | ICD-10-CM | POA: Diagnosis not present

## 2016-04-28 DIAGNOSIS — D509 Iron deficiency anemia, unspecified: Secondary | ICD-10-CM | POA: Diagnosis not present

## 2016-05-01 DIAGNOSIS — Z87891 Personal history of nicotine dependence: Secondary | ICD-10-CM | POA: Diagnosis not present

## 2016-05-01 DIAGNOSIS — W06XXXA Fall from bed, initial encounter: Secondary | ICD-10-CM | POA: Diagnosis not present

## 2016-05-01 DIAGNOSIS — S39012A Strain of muscle, fascia and tendon of lower back, initial encounter: Secondary | ICD-10-CM | POA: Diagnosis not present

## 2016-05-01 DIAGNOSIS — S6992XA Unspecified injury of left wrist, hand and finger(s), initial encounter: Secondary | ICD-10-CM | POA: Diagnosis not present

## 2016-05-01 DIAGNOSIS — S0990XA Unspecified injury of head, initial encounter: Secondary | ICD-10-CM | POA: Diagnosis not present

## 2016-05-01 DIAGNOSIS — R51 Headache: Secondary | ICD-10-CM | POA: Diagnosis not present

## 2016-05-01 DIAGNOSIS — I739 Peripheral vascular disease, unspecified: Secondary | ICD-10-CM | POA: Diagnosis not present

## 2016-05-01 DIAGNOSIS — E119 Type 2 diabetes mellitus without complications: Secondary | ICD-10-CM | POA: Diagnosis not present

## 2016-05-01 DIAGNOSIS — S161XXA Strain of muscle, fascia and tendon at neck level, initial encounter: Secondary | ICD-10-CM | POA: Diagnosis not present

## 2016-05-01 DIAGNOSIS — M25532 Pain in left wrist: Secondary | ICD-10-CM | POA: Diagnosis not present

## 2016-05-01 DIAGNOSIS — I10 Essential (primary) hypertension: Secondary | ICD-10-CM | POA: Diagnosis not present

## 2016-05-01 DIAGNOSIS — M545 Low back pain: Secondary | ICD-10-CM | POA: Diagnosis not present

## 2016-05-01 DIAGNOSIS — Z8673 Personal history of transient ischemic attack (TIA), and cerebral infarction without residual deficits: Secondary | ICD-10-CM | POA: Diagnosis not present

## 2016-05-01 DIAGNOSIS — S3992XA Unspecified injury of lower back, initial encounter: Secondary | ICD-10-CM | POA: Diagnosis not present

## 2016-05-01 DIAGNOSIS — S199XXA Unspecified injury of neck, initial encounter: Secondary | ICD-10-CM | POA: Diagnosis not present

## 2016-05-01 DIAGNOSIS — S46911A Strain of unspecified muscle, fascia and tendon at shoulder and upper arm level, right arm, initial encounter: Secondary | ICD-10-CM | POA: Diagnosis not present

## 2016-05-01 DIAGNOSIS — Z7982 Long term (current) use of aspirin: Secondary | ICD-10-CM | POA: Diagnosis not present

## 2016-05-01 DIAGNOSIS — M25511 Pain in right shoulder: Secondary | ICD-10-CM | POA: Diagnosis not present

## 2016-05-01 DIAGNOSIS — M542 Cervicalgia: Secondary | ICD-10-CM | POA: Diagnosis not present

## 2016-05-02 DIAGNOSIS — E875 Hyperkalemia: Secondary | ICD-10-CM | POA: Diagnosis not present

## 2016-05-02 DIAGNOSIS — R51 Headache: Secondary | ICD-10-CM | POA: Diagnosis not present

## 2016-05-02 DIAGNOSIS — Z7982 Long term (current) use of aspirin: Secondary | ICD-10-CM | POA: Diagnosis not present

## 2016-05-02 DIAGNOSIS — S161XXA Strain of muscle, fascia and tendon at neck level, initial encounter: Secondary | ICD-10-CM | POA: Diagnosis not present

## 2016-05-02 DIAGNOSIS — Z992 Dependence on renal dialysis: Secondary | ICD-10-CM | POA: Diagnosis not present

## 2016-05-02 DIAGNOSIS — M479 Spondylosis, unspecified: Secondary | ICD-10-CM | POA: Diagnosis not present

## 2016-05-02 DIAGNOSIS — Z880 Allergy status to penicillin: Secondary | ICD-10-CM | POA: Diagnosis not present

## 2016-05-02 DIAGNOSIS — E1122 Type 2 diabetes mellitus with diabetic chronic kidney disease: Secondary | ICD-10-CM | POA: Diagnosis not present

## 2016-05-02 DIAGNOSIS — Z8489 Family history of other specified conditions: Secondary | ICD-10-CM | POA: Diagnosis not present

## 2016-05-02 DIAGNOSIS — Z8249 Family history of ischemic heart disease and other diseases of the circulatory system: Secondary | ICD-10-CM | POA: Diagnosis not present

## 2016-05-02 DIAGNOSIS — R531 Weakness: Secondary | ICD-10-CM | POA: Diagnosis not present

## 2016-05-02 DIAGNOSIS — Z955 Presence of coronary angioplasty implant and graft: Secondary | ICD-10-CM | POA: Diagnosis not present

## 2016-05-02 DIAGNOSIS — T8612 Kidney transplant failure: Secondary | ICD-10-CM | POA: Diagnosis not present

## 2016-05-02 DIAGNOSIS — Z79899 Other long term (current) drug therapy: Secondary | ICD-10-CM | POA: Diagnosis not present

## 2016-05-02 DIAGNOSIS — I739 Peripheral vascular disease, unspecified: Secondary | ICD-10-CM | POA: Diagnosis not present

## 2016-05-02 DIAGNOSIS — M542 Cervicalgia: Secondary | ICD-10-CM | POA: Diagnosis not present

## 2016-05-02 DIAGNOSIS — I2510 Atherosclerotic heart disease of native coronary artery without angina pectoris: Secondary | ICD-10-CM | POA: Diagnosis not present

## 2016-05-02 DIAGNOSIS — M549 Dorsalgia, unspecified: Secondary | ICD-10-CM | POA: Diagnosis not present

## 2016-05-02 DIAGNOSIS — Z8673 Personal history of transient ischemic attack (TIA), and cerebral infarction without residual deficits: Secondary | ICD-10-CM | POA: Diagnosis not present

## 2016-05-02 DIAGNOSIS — E11319 Type 2 diabetes mellitus with unspecified diabetic retinopathy without macular edema: Secondary | ICD-10-CM | POA: Diagnosis not present

## 2016-05-02 DIAGNOSIS — D631 Anemia in chronic kidney disease: Secondary | ICD-10-CM | POA: Diagnosis not present

## 2016-05-02 DIAGNOSIS — I120 Hypertensive chronic kidney disease with stage 5 chronic kidney disease or end stage renal disease: Secondary | ICD-10-CM | POA: Diagnosis not present

## 2016-05-02 DIAGNOSIS — N186 End stage renal disease: Secondary | ICD-10-CM | POA: Diagnosis not present

## 2016-05-02 DIAGNOSIS — Z87891 Personal history of nicotine dependence: Secondary | ICD-10-CM | POA: Diagnosis not present

## 2016-05-02 DIAGNOSIS — Z86718 Personal history of other venous thrombosis and embolism: Secondary | ICD-10-CM | POA: Diagnosis not present

## 2016-05-02 DIAGNOSIS — E1129 Type 2 diabetes mellitus with other diabetic kidney complication: Secondary | ICD-10-CM | POA: Diagnosis not present

## 2016-05-03 DIAGNOSIS — Z992 Dependence on renal dialysis: Secondary | ICD-10-CM | POA: Diagnosis not present

## 2016-05-03 DIAGNOSIS — N186 End stage renal disease: Secondary | ICD-10-CM | POA: Diagnosis not present

## 2016-05-03 DIAGNOSIS — E875 Hyperkalemia: Secondary | ICD-10-CM | POA: Diagnosis not present

## 2016-05-03 DIAGNOSIS — I739 Peripheral vascular disease, unspecified: Secondary | ICD-10-CM | POA: Diagnosis not present

## 2016-05-03 DIAGNOSIS — M542 Cervicalgia: Secondary | ICD-10-CM | POA: Diagnosis not present

## 2016-05-03 DIAGNOSIS — I120 Hypertensive chronic kidney disease with stage 5 chronic kidney disease or end stage renal disease: Secondary | ICD-10-CM | POA: Diagnosis not present

## 2016-05-03 DIAGNOSIS — I2510 Atherosclerotic heart disease of native coronary artery without angina pectoris: Secondary | ICD-10-CM | POA: Diagnosis not present

## 2016-05-03 DIAGNOSIS — I10 Essential (primary) hypertension: Secondary | ICD-10-CM | POA: Diagnosis not present

## 2016-05-03 DIAGNOSIS — E1122 Type 2 diabetes mellitus with diabetic chronic kidney disease: Secondary | ICD-10-CM | POA: Diagnosis not present

## 2016-05-04 DIAGNOSIS — E162 Hypoglycemia, unspecified: Secondary | ICD-10-CM | POA: Diagnosis not present

## 2016-05-04 DIAGNOSIS — Z992 Dependence on renal dialysis: Secondary | ICD-10-CM | POA: Diagnosis not present

## 2016-05-04 DIAGNOSIS — D509 Iron deficiency anemia, unspecified: Secondary | ICD-10-CM | POA: Diagnosis not present

## 2016-05-04 DIAGNOSIS — N2581 Secondary hyperparathyroidism of renal origin: Secondary | ICD-10-CM | POA: Diagnosis not present

## 2016-05-04 DIAGNOSIS — N186 End stage renal disease: Secondary | ICD-10-CM | POA: Diagnosis not present

## 2016-05-04 DIAGNOSIS — I739 Peripheral vascular disease, unspecified: Secondary | ICD-10-CM | POA: Diagnosis not present

## 2016-05-04 DIAGNOSIS — I120 Hypertensive chronic kidney disease with stage 5 chronic kidney disease or end stage renal disease: Secondary | ICD-10-CM | POA: Diagnosis not present

## 2016-05-04 DIAGNOSIS — M542 Cervicalgia: Secondary | ICD-10-CM | POA: Diagnosis not present

## 2016-05-04 DIAGNOSIS — E1122 Type 2 diabetes mellitus with diabetic chronic kidney disease: Secondary | ICD-10-CM | POA: Diagnosis not present

## 2016-05-04 DIAGNOSIS — D631 Anemia in chronic kidney disease: Secondary | ICD-10-CM | POA: Diagnosis not present

## 2016-05-04 DIAGNOSIS — I10 Essential (primary) hypertension: Secondary | ICD-10-CM | POA: Diagnosis not present

## 2016-05-04 DIAGNOSIS — I2510 Atherosclerotic heart disease of native coronary artery without angina pectoris: Secondary | ICD-10-CM | POA: Diagnosis not present

## 2016-05-05 DIAGNOSIS — M5020 Other cervical disc displacement, unspecified cervical region: Secondary | ICD-10-CM | POA: Diagnosis not present

## 2016-05-05 DIAGNOSIS — H548 Legal blindness, as defined in USA: Secondary | ICD-10-CM | POA: Diagnosis not present

## 2016-05-05 DIAGNOSIS — T8612 Kidney transplant failure: Secondary | ICD-10-CM | POA: Diagnosis not present

## 2016-05-05 DIAGNOSIS — Z7982 Long term (current) use of aspirin: Secondary | ICD-10-CM | POA: Diagnosis not present

## 2016-05-05 DIAGNOSIS — R296 Repeated falls: Secondary | ICD-10-CM | POA: Diagnosis not present

## 2016-05-05 DIAGNOSIS — I2510 Atherosclerotic heart disease of native coronary artery without angina pectoris: Secondary | ICD-10-CM | POA: Diagnosis not present

## 2016-05-05 DIAGNOSIS — S134XXD Sprain of ligaments of cervical spine, subsequent encounter: Secondary | ICD-10-CM | POA: Diagnosis not present

## 2016-05-05 DIAGNOSIS — E1122 Type 2 diabetes mellitus with diabetic chronic kidney disease: Secondary | ICD-10-CM | POA: Diagnosis not present

## 2016-05-05 DIAGNOSIS — N186 End stage renal disease: Secondary | ICD-10-CM | POA: Diagnosis not present

## 2016-05-05 DIAGNOSIS — M519 Unspecified thoracic, thoracolumbar and lumbosacral intervertebral disc disorder: Secondary | ICD-10-CM | POA: Diagnosis not present

## 2016-05-05 DIAGNOSIS — K279 Peptic ulcer, site unspecified, unspecified as acute or chronic, without hemorrhage or perforation: Secondary | ICD-10-CM | POA: Diagnosis not present

## 2016-05-05 DIAGNOSIS — Z9181 History of falling: Secondary | ICD-10-CM | POA: Diagnosis not present

## 2016-05-05 DIAGNOSIS — M5021 Other cervical disc displacement,  high cervical region: Secondary | ICD-10-CM | POA: Diagnosis not present

## 2016-05-06 DIAGNOSIS — Z992 Dependence on renal dialysis: Secondary | ICD-10-CM | POA: Diagnosis not present

## 2016-05-06 DIAGNOSIS — E162 Hypoglycemia, unspecified: Secondary | ICD-10-CM | POA: Diagnosis not present

## 2016-05-06 DIAGNOSIS — N2581 Secondary hyperparathyroidism of renal origin: Secondary | ICD-10-CM | POA: Diagnosis not present

## 2016-05-06 DIAGNOSIS — N186 End stage renal disease: Secondary | ICD-10-CM | POA: Diagnosis not present

## 2016-05-06 DIAGNOSIS — D631 Anemia in chronic kidney disease: Secondary | ICD-10-CM | POA: Diagnosis not present

## 2016-05-06 DIAGNOSIS — D509 Iron deficiency anemia, unspecified: Secondary | ICD-10-CM | POA: Diagnosis not present

## 2016-05-08 DIAGNOSIS — D509 Iron deficiency anemia, unspecified: Secondary | ICD-10-CM | POA: Diagnosis not present

## 2016-05-08 DIAGNOSIS — D631 Anemia in chronic kidney disease: Secondary | ICD-10-CM | POA: Diagnosis not present

## 2016-05-08 DIAGNOSIS — E162 Hypoglycemia, unspecified: Secondary | ICD-10-CM | POA: Diagnosis not present

## 2016-05-08 DIAGNOSIS — Z992 Dependence on renal dialysis: Secondary | ICD-10-CM | POA: Diagnosis not present

## 2016-05-08 DIAGNOSIS — N186 End stage renal disease: Secondary | ICD-10-CM | POA: Diagnosis not present

## 2016-05-08 DIAGNOSIS — N2581 Secondary hyperparathyroidism of renal origin: Secondary | ICD-10-CM | POA: Diagnosis not present

## 2016-05-09 DIAGNOSIS — S134XXD Sprain of ligaments of cervical spine, subsequent encounter: Secondary | ICD-10-CM | POA: Diagnosis not present

## 2016-05-09 DIAGNOSIS — M5020 Other cervical disc displacement, unspecified cervical region: Secondary | ICD-10-CM | POA: Diagnosis not present

## 2016-05-09 DIAGNOSIS — M519 Unspecified thoracic, thoracolumbar and lumbosacral intervertebral disc disorder: Secondary | ICD-10-CM | POA: Diagnosis not present

## 2016-05-09 DIAGNOSIS — E1122 Type 2 diabetes mellitus with diabetic chronic kidney disease: Secondary | ICD-10-CM | POA: Diagnosis not present

## 2016-05-09 DIAGNOSIS — N186 End stage renal disease: Secondary | ICD-10-CM | POA: Diagnosis not present

## 2016-05-09 DIAGNOSIS — I2510 Atherosclerotic heart disease of native coronary artery without angina pectoris: Secondary | ICD-10-CM | POA: Diagnosis not present

## 2016-05-10 DIAGNOSIS — Z992 Dependence on renal dialysis: Secondary | ICD-10-CM | POA: Diagnosis not present

## 2016-05-10 DIAGNOSIS — D631 Anemia in chronic kidney disease: Secondary | ICD-10-CM | POA: Diagnosis not present

## 2016-05-10 DIAGNOSIS — N2581 Secondary hyperparathyroidism of renal origin: Secondary | ICD-10-CM | POA: Diagnosis not present

## 2016-05-10 DIAGNOSIS — N186 End stage renal disease: Secondary | ICD-10-CM | POA: Diagnosis not present

## 2016-05-10 DIAGNOSIS — D509 Iron deficiency anemia, unspecified: Secondary | ICD-10-CM | POA: Diagnosis not present

## 2016-05-10 DIAGNOSIS — E162 Hypoglycemia, unspecified: Secondary | ICD-10-CM | POA: Diagnosis not present

## 2016-05-11 DIAGNOSIS — M5020 Other cervical disc displacement, unspecified cervical region: Secondary | ICD-10-CM | POA: Diagnosis not present

## 2016-05-11 DIAGNOSIS — E1122 Type 2 diabetes mellitus with diabetic chronic kidney disease: Secondary | ICD-10-CM | POA: Diagnosis not present

## 2016-05-11 DIAGNOSIS — K611 Rectal abscess: Secondary | ICD-10-CM | POA: Diagnosis not present

## 2016-05-11 DIAGNOSIS — M542 Cervicalgia: Secondary | ICD-10-CM | POA: Diagnosis not present

## 2016-05-11 DIAGNOSIS — I2510 Atherosclerotic heart disease of native coronary artery without angina pectoris: Secondary | ICD-10-CM | POA: Diagnosis not present

## 2016-05-11 DIAGNOSIS — S134XXD Sprain of ligaments of cervical spine, subsequent encounter: Secondary | ICD-10-CM | POA: Diagnosis not present

## 2016-05-11 DIAGNOSIS — N186 End stage renal disease: Secondary | ICD-10-CM | POA: Diagnosis not present

## 2016-05-11 DIAGNOSIS — M519 Unspecified thoracic, thoracolumbar and lumbosacral intervertebral disc disorder: Secondary | ICD-10-CM | POA: Diagnosis not present

## 2016-05-12 DIAGNOSIS — N186 End stage renal disease: Secondary | ICD-10-CM | POA: Diagnosis not present

## 2016-05-12 DIAGNOSIS — N2581 Secondary hyperparathyroidism of renal origin: Secondary | ICD-10-CM | POA: Diagnosis not present

## 2016-05-12 DIAGNOSIS — E162 Hypoglycemia, unspecified: Secondary | ICD-10-CM | POA: Diagnosis not present

## 2016-05-12 DIAGNOSIS — D631 Anemia in chronic kidney disease: Secondary | ICD-10-CM | POA: Diagnosis not present

## 2016-05-12 DIAGNOSIS — D509 Iron deficiency anemia, unspecified: Secondary | ICD-10-CM | POA: Diagnosis not present

## 2016-05-12 DIAGNOSIS — Z992 Dependence on renal dialysis: Secondary | ICD-10-CM | POA: Diagnosis not present

## 2016-05-15 DIAGNOSIS — D631 Anemia in chronic kidney disease: Secondary | ICD-10-CM | POA: Diagnosis not present

## 2016-05-15 DIAGNOSIS — N186 End stage renal disease: Secondary | ICD-10-CM | POA: Diagnosis not present

## 2016-05-15 DIAGNOSIS — E162 Hypoglycemia, unspecified: Secondary | ICD-10-CM | POA: Diagnosis not present

## 2016-05-15 DIAGNOSIS — D509 Iron deficiency anemia, unspecified: Secondary | ICD-10-CM | POA: Diagnosis not present

## 2016-05-15 DIAGNOSIS — N2581 Secondary hyperparathyroidism of renal origin: Secondary | ICD-10-CM | POA: Diagnosis not present

## 2016-05-15 DIAGNOSIS — Z992 Dependence on renal dialysis: Secondary | ICD-10-CM | POA: Diagnosis not present

## 2016-05-16 DIAGNOSIS — I2510 Atherosclerotic heart disease of native coronary artery without angina pectoris: Secondary | ICD-10-CM | POA: Diagnosis not present

## 2016-05-16 DIAGNOSIS — S134XXD Sprain of ligaments of cervical spine, subsequent encounter: Secondary | ICD-10-CM | POA: Diagnosis not present

## 2016-05-16 DIAGNOSIS — M519 Unspecified thoracic, thoracolumbar and lumbosacral intervertebral disc disorder: Secondary | ICD-10-CM | POA: Diagnosis not present

## 2016-05-16 DIAGNOSIS — M5020 Other cervical disc displacement, unspecified cervical region: Secondary | ICD-10-CM | POA: Diagnosis not present

## 2016-05-16 DIAGNOSIS — E1122 Type 2 diabetes mellitus with diabetic chronic kidney disease: Secondary | ICD-10-CM | POA: Diagnosis not present

## 2016-05-16 DIAGNOSIS — N186 End stage renal disease: Secondary | ICD-10-CM | POA: Diagnosis not present

## 2016-05-17 DIAGNOSIS — D631 Anemia in chronic kidney disease: Secondary | ICD-10-CM | POA: Diagnosis not present

## 2016-05-17 DIAGNOSIS — N2581 Secondary hyperparathyroidism of renal origin: Secondary | ICD-10-CM | POA: Diagnosis not present

## 2016-05-17 DIAGNOSIS — E162 Hypoglycemia, unspecified: Secondary | ICD-10-CM | POA: Diagnosis not present

## 2016-05-17 DIAGNOSIS — Z992 Dependence on renal dialysis: Secondary | ICD-10-CM | POA: Diagnosis not present

## 2016-05-17 DIAGNOSIS — N186 End stage renal disease: Secondary | ICD-10-CM | POA: Diagnosis not present

## 2016-05-17 DIAGNOSIS — D509 Iron deficiency anemia, unspecified: Secondary | ICD-10-CM | POA: Diagnosis not present

## 2016-05-18 DIAGNOSIS — E1122 Type 2 diabetes mellitus with diabetic chronic kidney disease: Secondary | ICD-10-CM | POA: Diagnosis not present

## 2016-05-18 DIAGNOSIS — S134XXD Sprain of ligaments of cervical spine, subsequent encounter: Secondary | ICD-10-CM | POA: Diagnosis not present

## 2016-05-18 DIAGNOSIS — M519 Unspecified thoracic, thoracolumbar and lumbosacral intervertebral disc disorder: Secondary | ICD-10-CM | POA: Diagnosis not present

## 2016-05-18 DIAGNOSIS — I2510 Atherosclerotic heart disease of native coronary artery without angina pectoris: Secondary | ICD-10-CM | POA: Diagnosis not present

## 2016-05-18 DIAGNOSIS — N186 End stage renal disease: Secondary | ICD-10-CM | POA: Diagnosis not present

## 2016-05-18 DIAGNOSIS — M5020 Other cervical disc displacement, unspecified cervical region: Secondary | ICD-10-CM | POA: Diagnosis not present

## 2016-05-19 DIAGNOSIS — D509 Iron deficiency anemia, unspecified: Secondary | ICD-10-CM | POA: Diagnosis not present

## 2016-05-19 DIAGNOSIS — D631 Anemia in chronic kidney disease: Secondary | ICD-10-CM | POA: Diagnosis not present

## 2016-05-19 DIAGNOSIS — N2581 Secondary hyperparathyroidism of renal origin: Secondary | ICD-10-CM | POA: Diagnosis not present

## 2016-05-19 DIAGNOSIS — E162 Hypoglycemia, unspecified: Secondary | ICD-10-CM | POA: Diagnosis not present

## 2016-05-19 DIAGNOSIS — N186 End stage renal disease: Secondary | ICD-10-CM | POA: Diagnosis not present

## 2016-05-19 DIAGNOSIS — Z992 Dependence on renal dialysis: Secondary | ICD-10-CM | POA: Diagnosis not present

## 2016-05-22 DIAGNOSIS — E162 Hypoglycemia, unspecified: Secondary | ICD-10-CM | POA: Diagnosis not present

## 2016-05-22 DIAGNOSIS — N186 End stage renal disease: Secondary | ICD-10-CM | POA: Diagnosis not present

## 2016-05-22 DIAGNOSIS — D631 Anemia in chronic kidney disease: Secondary | ICD-10-CM | POA: Diagnosis not present

## 2016-05-22 DIAGNOSIS — Z992 Dependence on renal dialysis: Secondary | ICD-10-CM | POA: Diagnosis not present

## 2016-05-22 DIAGNOSIS — N2581 Secondary hyperparathyroidism of renal origin: Secondary | ICD-10-CM | POA: Diagnosis not present

## 2016-05-22 DIAGNOSIS — D509 Iron deficiency anemia, unspecified: Secondary | ICD-10-CM | POA: Diagnosis not present

## 2016-05-23 DIAGNOSIS — M5020 Other cervical disc displacement, unspecified cervical region: Secondary | ICD-10-CM | POA: Diagnosis not present

## 2016-05-23 DIAGNOSIS — M519 Unspecified thoracic, thoracolumbar and lumbosacral intervertebral disc disorder: Secondary | ICD-10-CM | POA: Diagnosis not present

## 2016-05-23 DIAGNOSIS — S134XXD Sprain of ligaments of cervical spine, subsequent encounter: Secondary | ICD-10-CM | POA: Diagnosis not present

## 2016-05-23 DIAGNOSIS — I2510 Atherosclerotic heart disease of native coronary artery without angina pectoris: Secondary | ICD-10-CM | POA: Diagnosis not present

## 2016-05-23 DIAGNOSIS — N186 End stage renal disease: Secondary | ICD-10-CM | POA: Diagnosis not present

## 2016-05-23 DIAGNOSIS — E1122 Type 2 diabetes mellitus with diabetic chronic kidney disease: Secondary | ICD-10-CM | POA: Diagnosis not present

## 2016-05-24 DIAGNOSIS — N186 End stage renal disease: Secondary | ICD-10-CM | POA: Diagnosis not present

## 2016-05-24 DIAGNOSIS — D509 Iron deficiency anemia, unspecified: Secondary | ICD-10-CM | POA: Diagnosis not present

## 2016-05-24 DIAGNOSIS — Z992 Dependence on renal dialysis: Secondary | ICD-10-CM | POA: Diagnosis not present

## 2016-05-24 DIAGNOSIS — N2581 Secondary hyperparathyroidism of renal origin: Secondary | ICD-10-CM | POA: Diagnosis not present

## 2016-05-25 DIAGNOSIS — I2510 Atherosclerotic heart disease of native coronary artery without angina pectoris: Secondary | ICD-10-CM | POA: Diagnosis not present

## 2016-05-25 DIAGNOSIS — E1122 Type 2 diabetes mellitus with diabetic chronic kidney disease: Secondary | ICD-10-CM | POA: Diagnosis not present

## 2016-05-25 DIAGNOSIS — M5020 Other cervical disc displacement, unspecified cervical region: Secondary | ICD-10-CM | POA: Diagnosis not present

## 2016-05-25 DIAGNOSIS — M519 Unspecified thoracic, thoracolumbar and lumbosacral intervertebral disc disorder: Secondary | ICD-10-CM | POA: Diagnosis not present

## 2016-05-25 DIAGNOSIS — S134XXD Sprain of ligaments of cervical spine, subsequent encounter: Secondary | ICD-10-CM | POA: Diagnosis not present

## 2016-05-25 DIAGNOSIS — N186 End stage renal disease: Secondary | ICD-10-CM | POA: Diagnosis not present

## 2016-05-26 DIAGNOSIS — N2581 Secondary hyperparathyroidism of renal origin: Secondary | ICD-10-CM | POA: Diagnosis not present

## 2016-05-26 DIAGNOSIS — Z992 Dependence on renal dialysis: Secondary | ICD-10-CM | POA: Diagnosis not present

## 2016-05-26 DIAGNOSIS — N186 End stage renal disease: Secondary | ICD-10-CM | POA: Diagnosis not present

## 2016-05-26 DIAGNOSIS — D509 Iron deficiency anemia, unspecified: Secondary | ICD-10-CM | POA: Diagnosis not present

## 2016-05-29 DIAGNOSIS — N186 End stage renal disease: Secondary | ICD-10-CM | POA: Diagnosis not present

## 2016-05-29 DIAGNOSIS — D509 Iron deficiency anemia, unspecified: Secondary | ICD-10-CM | POA: Diagnosis not present

## 2016-05-29 DIAGNOSIS — Z992 Dependence on renal dialysis: Secondary | ICD-10-CM | POA: Diagnosis not present

## 2016-05-29 DIAGNOSIS — N2581 Secondary hyperparathyroidism of renal origin: Secondary | ICD-10-CM | POA: Diagnosis not present

## 2016-05-31 DIAGNOSIS — N186 End stage renal disease: Secondary | ICD-10-CM | POA: Diagnosis not present

## 2016-05-31 DIAGNOSIS — D509 Iron deficiency anemia, unspecified: Secondary | ICD-10-CM | POA: Diagnosis not present

## 2016-05-31 DIAGNOSIS — N2581 Secondary hyperparathyroidism of renal origin: Secondary | ICD-10-CM | POA: Diagnosis not present

## 2016-05-31 DIAGNOSIS — Z992 Dependence on renal dialysis: Secondary | ICD-10-CM | POA: Diagnosis not present

## 2016-06-02 DIAGNOSIS — N186 End stage renal disease: Secondary | ICD-10-CM | POA: Diagnosis not present

## 2016-06-02 DIAGNOSIS — Z992 Dependence on renal dialysis: Secondary | ICD-10-CM | POA: Diagnosis not present

## 2016-06-02 DIAGNOSIS — D509 Iron deficiency anemia, unspecified: Secondary | ICD-10-CM | POA: Diagnosis not present

## 2016-06-02 DIAGNOSIS — N2581 Secondary hyperparathyroidism of renal origin: Secondary | ICD-10-CM | POA: Diagnosis not present

## 2016-06-05 DIAGNOSIS — N186 End stage renal disease: Secondary | ICD-10-CM | POA: Diagnosis not present

## 2016-06-05 DIAGNOSIS — Z992 Dependence on renal dialysis: Secondary | ICD-10-CM | POA: Diagnosis not present

## 2016-06-05 DIAGNOSIS — D509 Iron deficiency anemia, unspecified: Secondary | ICD-10-CM | POA: Diagnosis not present

## 2016-06-05 DIAGNOSIS — N2581 Secondary hyperparathyroidism of renal origin: Secondary | ICD-10-CM | POA: Diagnosis not present

## 2016-06-06 DIAGNOSIS — M5020 Other cervical disc displacement, unspecified cervical region: Secondary | ICD-10-CM | POA: Diagnosis not present

## 2016-06-06 DIAGNOSIS — I2510 Atherosclerotic heart disease of native coronary artery without angina pectoris: Secondary | ICD-10-CM | POA: Diagnosis not present

## 2016-06-06 DIAGNOSIS — E1122 Type 2 diabetes mellitus with diabetic chronic kidney disease: Secondary | ICD-10-CM | POA: Diagnosis not present

## 2016-06-06 DIAGNOSIS — N186 End stage renal disease: Secondary | ICD-10-CM | POA: Diagnosis not present

## 2016-06-06 DIAGNOSIS — M519 Unspecified thoracic, thoracolumbar and lumbosacral intervertebral disc disorder: Secondary | ICD-10-CM | POA: Diagnosis not present

## 2016-06-06 DIAGNOSIS — S134XXD Sprain of ligaments of cervical spine, subsequent encounter: Secondary | ICD-10-CM | POA: Diagnosis not present

## 2016-06-07 DIAGNOSIS — D509 Iron deficiency anemia, unspecified: Secondary | ICD-10-CM | POA: Diagnosis not present

## 2016-06-07 DIAGNOSIS — N186 End stage renal disease: Secondary | ICD-10-CM | POA: Diagnosis not present

## 2016-06-07 DIAGNOSIS — N2581 Secondary hyperparathyroidism of renal origin: Secondary | ICD-10-CM | POA: Diagnosis not present

## 2016-06-07 DIAGNOSIS — Z992 Dependence on renal dialysis: Secondary | ICD-10-CM | POA: Diagnosis not present

## 2016-06-08 DIAGNOSIS — E1151 Type 2 diabetes mellitus with diabetic peripheral angiopathy without gangrene: Secondary | ICD-10-CM | POA: Diagnosis not present

## 2016-06-08 DIAGNOSIS — M79676 Pain in unspecified toe(s): Secondary | ICD-10-CM | POA: Diagnosis not present

## 2016-06-08 DIAGNOSIS — B351 Tinea unguium: Secondary | ICD-10-CM | POA: Diagnosis not present

## 2016-06-08 DIAGNOSIS — L84 Corns and callosities: Secondary | ICD-10-CM | POA: Diagnosis not present

## 2016-06-09 DIAGNOSIS — N2581 Secondary hyperparathyroidism of renal origin: Secondary | ICD-10-CM | POA: Diagnosis not present

## 2016-06-09 DIAGNOSIS — N186 End stage renal disease: Secondary | ICD-10-CM | POA: Diagnosis not present

## 2016-06-09 DIAGNOSIS — D509 Iron deficiency anemia, unspecified: Secondary | ICD-10-CM | POA: Diagnosis not present

## 2016-06-09 DIAGNOSIS — Z992 Dependence on renal dialysis: Secondary | ICD-10-CM | POA: Diagnosis not present

## 2016-06-12 ENCOUNTER — Ambulatory Visit: Payer: Medicare Other | Admitting: Gastroenterology

## 2016-06-12 DIAGNOSIS — N2581 Secondary hyperparathyroidism of renal origin: Secondary | ICD-10-CM | POA: Diagnosis not present

## 2016-06-12 DIAGNOSIS — N186 End stage renal disease: Secondary | ICD-10-CM | POA: Diagnosis not present

## 2016-06-12 DIAGNOSIS — Z992 Dependence on renal dialysis: Secondary | ICD-10-CM | POA: Diagnosis not present

## 2016-06-12 DIAGNOSIS — D509 Iron deficiency anemia, unspecified: Secondary | ICD-10-CM | POA: Diagnosis not present

## 2016-06-14 DIAGNOSIS — Z992 Dependence on renal dialysis: Secondary | ICD-10-CM | POA: Diagnosis not present

## 2016-06-14 DIAGNOSIS — N2581 Secondary hyperparathyroidism of renal origin: Secondary | ICD-10-CM | POA: Diagnosis not present

## 2016-06-14 DIAGNOSIS — D509 Iron deficiency anemia, unspecified: Secondary | ICD-10-CM | POA: Diagnosis not present

## 2016-06-14 DIAGNOSIS — N186 End stage renal disease: Secondary | ICD-10-CM | POA: Diagnosis not present

## 2016-06-15 ENCOUNTER — Encounter: Payer: Self-pay | Admitting: Gastroenterology

## 2016-06-15 ENCOUNTER — Ambulatory Visit (INDEPENDENT_AMBULATORY_CARE_PROVIDER_SITE_OTHER): Payer: Medicare Other | Admitting: Gastroenterology

## 2016-06-15 DIAGNOSIS — R195 Other fecal abnormalities: Secondary | ICD-10-CM | POA: Diagnosis not present

## 2016-06-15 MED ORDER — PANCRELIPASE (LIP-PROT-AMYL) 36000-114000 UNITS PO CPEP: ORAL_CAPSULE | ORAL | 11 refills | Status: AC

## 2016-06-15 NOTE — Patient Instructions (Addendum)
TO PREVENT EXPLOSIVE WATERY DIARRHEA,       1. FOLLOW A LOW FAT DIET. MEATS SHOULD BE BAKED, BROILED, OR BOILED. AVOID FRIED FOODS.       2. CONTINUE COLESTID TWICE A DAY.       3. ADD CREON 2 WITH MEALS AND ONE WITH SNACKS.       4. IF YOU CONSUME DAIRY, ADD LACTASE 3 PILLS WITH MEALS UP TO THREE TIMES A DAY.   FOLLOW UP IN 2 MOS.

## 2016-06-15 NOTE — Progress Notes (Signed)
cc'ed to pcp °

## 2016-06-15 NOTE — Progress Notes (Signed)
Subjective:    Patient ID: Wayne Hopkins, male    DOB: 06/14/1940, 76 y.o.   MRN: [id number]  Neale Burly, MD  HPI HAVING DIARRHEA OFF AND ON. NOT EATING GOOD SOME DAYS. WATERY STOOLS TODAY: BIG BLOW OUT AFTER 2 SAUSAGE BISCUITS. DEVILED EGGS FOR BREAKFAST AND TOLERATES. WHITE PILL GIVES HIM GAS. LAST NL FORMED STOOL: MONTH OR BETTER. HARD TO FLUSH STOOL DOWN. FELT SUBJECTIVE CHILLS. STAYS COLD AT THE TIME. MAY HAVE OCCASIONAL NAUSEA. FELL LAST THUR & THOUGHT HE CRACKED A RIB HAS DISCOMFORT IN LEFT LOWER ABD DUE TO A HERNIA. CT DEC 2017 ATROPHIED PANCREAS.  PT DENIES FEVER, CHILLS, HEMATOCHEZIA, HEMATEMESIS, vomiting, melena, SHORTNESS OF BREATH, CHANGE IN BOWEL IN HABITS, constipation, problems swallowing, OR  heartburn or indigestion.  Past Medical History:  Diagnosis Date  . Atrial flutter ([place])   . DDD (degenerative disc disease), lumbar   . Diabetes mellitus without complication ([place])   . ESRD (end stage renal disease) ([place])   . Hernia, inguinal, right   . Hyperlipidemia   . Hypertension   . Long-term use of immunosuppressant medication   . Lumbar spondylosis   . Peripheral edema   . Stroke [facility])    Past Surgical History:  Procedure Laterality Date  . BACK SURGERY    . BIOPSY  03/14/2016   Procedure: BIOPSY;  Surgeon: Danie Binder, MD;  [facility];  Service: Endoscopy;;  colon  . CHOLECYSTECTOMY    . COLONOSCOPY N/A 03/14/2016   Procedure: COLONOSCOPY;  Surgeon: Danie Binder, MD;  Location: [facility];  Service: Endoscopy;  Laterality: N/A;  2:45pm - pt knows to arrive at 11:45  . KIDNEY TRANSPLANT  2005  . POLYPECTOMY  03/14/2016   Procedure: POLYPECTOMY;  Surgeon: Danie Binder, MD;  Location: [facility];  Service: Endoscopy;;  colon; ileal   Allergies  Allergen Reactions  . Penicillins Swelling       . Tape Other (See Comments)    Pt states he cannot use plastic tape, tears his skin   Current Outpatient Prescriptions  Medication Sig  Dispense Refill  . aspirin EC 81 MG tablet Take 81 mg by mouth daily.    . Cholecalciferol (VITAMIN D-1000 MAX ST) 1000 units tablet Take 2,000 Units by mouth daily.     . colestipol (COLESTID) 1 g tablet TAKE (1) TABLET TWICE DAILY TO PREVENT DIARRHEA    . loperamide (IMODIUM) 2 MG capsule 1 CAPSULE Q2-3 HRS IF NEEDED TO CONTROL STOOL. NO MORE THAN 4 PILLS DAILY TOOK 2 TODAY   . midodrine (PROAMATINE) 10 MG tablet Take 10 mg by mouth. Take 15 mins before dialysis, 3 times per week to stabilize blood pressure prior to dialysis    . Omega-3 Fatty Acids (FISH OIL) 1200 MG CAPS Take 1,200 mg by mouth 2 (two) times daily.     . predniSONE (DELTASONE) 10 MG tablet Take 5 mg by mouth every other day.     . sevelamer carbonate (RENVELA) 800 MG tablet Take 2,400 mg by mouth 3 (three) times daily with meals. Takes [phone] with snacks    . sodium bicarbonate 650 MG tablet Take 1,300 mg by mouth 2 (two) times daily.     Review of Systems PER HPI OTHERWISE ALL SYSTEMS ARE NEGATIVE.    Objective:   Physical Exam  Constitutional: He is oriented to person, place, and time. He appears well-developed and well-nourished. No distress.  HENT:  Head: Normocephalic and atraumatic.  Mouth/Throat: Oropharynx is clear and moist. No oropharyngeal exudate.  Eyes: Pupils are equal, round, and reactive to light. No scleral icterus.  Neck: Normal range of motion. Neck supple.  Cardiovascular: Normal rate, regular rhythm and normal heart sounds.   Pulmonary/Chest: Effort normal and breath sounds normal. No respiratory distress.  Abdominal: Soft. Bowel sounds are normal. He exhibits no distension. There is tenderness. There is no rebound and no guarding.  MILD TTP IN THE LUQ. BULGE IN RLQ.  Musculoskeletal: He exhibits no edema.  Lymphadenopathy:    He has no cervical adenopathy.  Neurological: He is alert and oriented to person, place, and time.  NO  NEW FOCAL DEFICITS, ABLE TO GET ON EXAM TABLE WITHOUT ASSISTANCE    Psychiatric: He has a normal mood and affect.  Vitals reviewed.     Assessment & Plan:

## 2016-06-15 NOTE — Assessment & Plan Note (Addendum)
SYMPTOMS NOT CONTROLLED ON COLESTID AND IMODIUM PRN AND DUE TO BILE SALT DIARRHEA/PANCREATIC INSUFFICIENCY(RISK FACTORS: AGE, DIABETES, ATROPHIC PANCREAS).  TO PREVENT EXPLOSIVE WATERY DIARRHEA,       1. FOLLOW A LOW FAT DIET. MEATS SHOULD BE BAKED, BROILED, OR BOILED. AVOID FRIED FOODS.      2. CONTINUE COLESTID TWICE A DAY.      3. ADD CREON 2 WITH MEALS AND ONE WITH SNACKS.      4. IF YOU CONSUME DAIRY, ADD LACTASE 3 PILLS WITH MEALS UP TO THREE TIMES A DAY.  FOLLOW UP IN 2 MOS.

## 2016-06-15 NOTE — Progress Notes (Signed)
ON RECALL  °

## 2016-06-16 DIAGNOSIS — Z992 Dependence on renal dialysis: Secondary | ICD-10-CM | POA: Diagnosis not present

## 2016-06-16 DIAGNOSIS — D509 Iron deficiency anemia, unspecified: Secondary | ICD-10-CM | POA: Diagnosis not present

## 2016-06-16 DIAGNOSIS — N186 End stage renal disease: Secondary | ICD-10-CM | POA: Diagnosis not present

## 2016-06-16 DIAGNOSIS — N2581 Secondary hyperparathyroidism of renal origin: Secondary | ICD-10-CM | POA: Diagnosis not present

## 2016-06-19 DIAGNOSIS — D509 Iron deficiency anemia, unspecified: Secondary | ICD-10-CM | POA: Diagnosis not present

## 2016-06-19 DIAGNOSIS — Z992 Dependence on renal dialysis: Secondary | ICD-10-CM | POA: Diagnosis not present

## 2016-06-19 DIAGNOSIS — N186 End stage renal disease: Secondary | ICD-10-CM | POA: Diagnosis not present

## 2016-06-19 DIAGNOSIS — N2581 Secondary hyperparathyroidism of renal origin: Secondary | ICD-10-CM | POA: Diagnosis not present

## 2016-06-20 DIAGNOSIS — M5020 Other cervical disc displacement, unspecified cervical region: Secondary | ICD-10-CM | POA: Diagnosis not present

## 2016-06-20 DIAGNOSIS — E1122 Type 2 diabetes mellitus with diabetic chronic kidney disease: Secondary | ICD-10-CM | POA: Diagnosis not present

## 2016-06-20 DIAGNOSIS — N186 End stage renal disease: Secondary | ICD-10-CM | POA: Diagnosis not present

## 2016-06-20 DIAGNOSIS — M519 Unspecified thoracic, thoracolumbar and lumbosacral intervertebral disc disorder: Secondary | ICD-10-CM | POA: Diagnosis not present

## 2016-06-20 DIAGNOSIS — I2510 Atherosclerotic heart disease of native coronary artery without angina pectoris: Secondary | ICD-10-CM | POA: Diagnosis not present

## 2016-06-20 DIAGNOSIS — S134XXD Sprain of ligaments of cervical spine, subsequent encounter: Secondary | ICD-10-CM | POA: Diagnosis not present

## 2016-06-21 DIAGNOSIS — N186 End stage renal disease: Secondary | ICD-10-CM | POA: Diagnosis not present

## 2016-06-21 DIAGNOSIS — D509 Iron deficiency anemia, unspecified: Secondary | ICD-10-CM | POA: Diagnosis not present

## 2016-06-21 DIAGNOSIS — N2581 Secondary hyperparathyroidism of renal origin: Secondary | ICD-10-CM | POA: Diagnosis not present

## 2016-06-21 DIAGNOSIS — Z992 Dependence on renal dialysis: Secondary | ICD-10-CM | POA: Diagnosis not present

## 2016-06-22 DIAGNOSIS — Z992 Dependence on renal dialysis: Secondary | ICD-10-CM | POA: Diagnosis not present

## 2016-06-22 DIAGNOSIS — N186 End stage renal disease: Secondary | ICD-10-CM | POA: Diagnosis not present

## 2016-06-23 DIAGNOSIS — D631 Anemia in chronic kidney disease: Secondary | ICD-10-CM | POA: Diagnosis not present

## 2016-06-23 DIAGNOSIS — D509 Iron deficiency anemia, unspecified: Secondary | ICD-10-CM | POA: Diagnosis not present

## 2016-06-23 DIAGNOSIS — N186 End stage renal disease: Secondary | ICD-10-CM | POA: Diagnosis not present

## 2016-06-23 DIAGNOSIS — Z992 Dependence on renal dialysis: Secondary | ICD-10-CM | POA: Diagnosis not present

## 2016-06-23 DIAGNOSIS — N2581 Secondary hyperparathyroidism of renal origin: Secondary | ICD-10-CM | POA: Diagnosis not present

## 2016-06-26 ENCOUNTER — Encounter: Payer: Self-pay | Admitting: Gastroenterology

## 2016-06-26 DIAGNOSIS — D509 Iron deficiency anemia, unspecified: Secondary | ICD-10-CM | POA: Diagnosis not present

## 2016-06-26 DIAGNOSIS — N2581 Secondary hyperparathyroidism of renal origin: Secondary | ICD-10-CM | POA: Diagnosis not present

## 2016-06-26 DIAGNOSIS — Z992 Dependence on renal dialysis: Secondary | ICD-10-CM | POA: Diagnosis not present

## 2016-06-26 DIAGNOSIS — N186 End stage renal disease: Secondary | ICD-10-CM | POA: Diagnosis not present

## 2016-06-26 DIAGNOSIS — D631 Anemia in chronic kidney disease: Secondary | ICD-10-CM | POA: Diagnosis not present

## 2016-06-28 DIAGNOSIS — Z992 Dependence on renal dialysis: Secondary | ICD-10-CM | POA: Diagnosis not present

## 2016-06-28 DIAGNOSIS — N186 End stage renal disease: Secondary | ICD-10-CM | POA: Diagnosis not present

## 2016-06-28 DIAGNOSIS — D509 Iron deficiency anemia, unspecified: Secondary | ICD-10-CM | POA: Diagnosis not present

## 2016-06-28 DIAGNOSIS — N2581 Secondary hyperparathyroidism of renal origin: Secondary | ICD-10-CM | POA: Diagnosis not present

## 2016-06-28 DIAGNOSIS — D631 Anemia in chronic kidney disease: Secondary | ICD-10-CM | POA: Diagnosis not present

## 2016-06-30 DIAGNOSIS — D631 Anemia in chronic kidney disease: Secondary | ICD-10-CM | POA: Diagnosis not present

## 2016-06-30 DIAGNOSIS — Z992 Dependence on renal dialysis: Secondary | ICD-10-CM | POA: Diagnosis not present

## 2016-06-30 DIAGNOSIS — N186 End stage renal disease: Secondary | ICD-10-CM | POA: Diagnosis not present

## 2016-06-30 DIAGNOSIS — D509 Iron deficiency anemia, unspecified: Secondary | ICD-10-CM | POA: Diagnosis not present

## 2016-06-30 DIAGNOSIS — N2581 Secondary hyperparathyroidism of renal origin: Secondary | ICD-10-CM | POA: Diagnosis not present

## 2016-07-03 DIAGNOSIS — D509 Iron deficiency anemia, unspecified: Secondary | ICD-10-CM | POA: Diagnosis not present

## 2016-07-03 DIAGNOSIS — N186 End stage renal disease: Secondary | ICD-10-CM | POA: Diagnosis not present

## 2016-07-03 DIAGNOSIS — N2581 Secondary hyperparathyroidism of renal origin: Secondary | ICD-10-CM | POA: Diagnosis not present

## 2016-07-03 DIAGNOSIS — D631 Anemia in chronic kidney disease: Secondary | ICD-10-CM | POA: Diagnosis not present

## 2016-07-03 DIAGNOSIS — Z992 Dependence on renal dialysis: Secondary | ICD-10-CM | POA: Diagnosis not present

## 2016-07-05 DIAGNOSIS — N186 End stage renal disease: Secondary | ICD-10-CM | POA: Diagnosis not present

## 2016-07-05 DIAGNOSIS — N2581 Secondary hyperparathyroidism of renal origin: Secondary | ICD-10-CM | POA: Diagnosis not present

## 2016-07-05 DIAGNOSIS — D631 Anemia in chronic kidney disease: Secondary | ICD-10-CM | POA: Diagnosis not present

## 2016-07-05 DIAGNOSIS — Z992 Dependence on renal dialysis: Secondary | ICD-10-CM | POA: Diagnosis not present

## 2016-07-05 DIAGNOSIS — D509 Iron deficiency anemia, unspecified: Secondary | ICD-10-CM | POA: Diagnosis not present

## 2016-07-07 DIAGNOSIS — Z992 Dependence on renal dialysis: Secondary | ICD-10-CM | POA: Diagnosis not present

## 2016-07-07 DIAGNOSIS — N186 End stage renal disease: Secondary | ICD-10-CM | POA: Diagnosis not present

## 2016-07-07 DIAGNOSIS — D509 Iron deficiency anemia, unspecified: Secondary | ICD-10-CM | POA: Diagnosis not present

## 2016-07-07 DIAGNOSIS — N2581 Secondary hyperparathyroidism of renal origin: Secondary | ICD-10-CM | POA: Diagnosis not present

## 2016-07-07 DIAGNOSIS — D631 Anemia in chronic kidney disease: Secondary | ICD-10-CM | POA: Diagnosis not present

## 2016-07-10 DIAGNOSIS — Z992 Dependence on renal dialysis: Secondary | ICD-10-CM | POA: Diagnosis not present

## 2016-07-10 DIAGNOSIS — N186 End stage renal disease: Secondary | ICD-10-CM | POA: Diagnosis not present

## 2016-07-10 DIAGNOSIS — D631 Anemia in chronic kidney disease: Secondary | ICD-10-CM | POA: Diagnosis not present

## 2016-07-10 DIAGNOSIS — D509 Iron deficiency anemia, unspecified: Secondary | ICD-10-CM | POA: Diagnosis not present

## 2016-07-10 DIAGNOSIS — N2581 Secondary hyperparathyroidism of renal origin: Secondary | ICD-10-CM | POA: Diagnosis not present

## 2016-07-12 DIAGNOSIS — N186 End stage renal disease: Secondary | ICD-10-CM | POA: Diagnosis not present

## 2016-07-12 DIAGNOSIS — D509 Iron deficiency anemia, unspecified: Secondary | ICD-10-CM | POA: Diagnosis not present

## 2016-07-12 DIAGNOSIS — Z992 Dependence on renal dialysis: Secondary | ICD-10-CM | POA: Diagnosis not present

## 2016-07-12 DIAGNOSIS — N2581 Secondary hyperparathyroidism of renal origin: Secondary | ICD-10-CM | POA: Diagnosis not present

## 2016-07-12 DIAGNOSIS — D631 Anemia in chronic kidney disease: Secondary | ICD-10-CM | POA: Diagnosis not present

## 2016-07-14 DIAGNOSIS — N186 End stage renal disease: Secondary | ICD-10-CM | POA: Diagnosis not present

## 2016-07-14 DIAGNOSIS — D631 Anemia in chronic kidney disease: Secondary | ICD-10-CM | POA: Diagnosis not present

## 2016-07-14 DIAGNOSIS — Z992 Dependence on renal dialysis: Secondary | ICD-10-CM | POA: Diagnosis not present

## 2016-07-14 DIAGNOSIS — N2581 Secondary hyperparathyroidism of renal origin: Secondary | ICD-10-CM | POA: Diagnosis not present

## 2016-07-14 DIAGNOSIS — D509 Iron deficiency anemia, unspecified: Secondary | ICD-10-CM | POA: Diagnosis not present

## 2016-07-17 DIAGNOSIS — N186 End stage renal disease: Secondary | ICD-10-CM | POA: Diagnosis not present

## 2016-07-17 DIAGNOSIS — D631 Anemia in chronic kidney disease: Secondary | ICD-10-CM | POA: Diagnosis not present

## 2016-07-17 DIAGNOSIS — E119 Type 2 diabetes mellitus without complications: Secondary | ICD-10-CM | POA: Diagnosis not present

## 2016-07-17 DIAGNOSIS — Z794 Long term (current) use of insulin: Secondary | ICD-10-CM | POA: Diagnosis not present

## 2016-07-17 DIAGNOSIS — D509 Iron deficiency anemia, unspecified: Secondary | ICD-10-CM | POA: Diagnosis not present

## 2016-07-17 DIAGNOSIS — Z992 Dependence on renal dialysis: Secondary | ICD-10-CM | POA: Diagnosis not present

## 2016-07-17 DIAGNOSIS — N2581 Secondary hyperparathyroidism of renal origin: Secondary | ICD-10-CM | POA: Diagnosis not present

## 2016-07-19 DIAGNOSIS — D509 Iron deficiency anemia, unspecified: Secondary | ICD-10-CM | POA: Diagnosis not present

## 2016-07-19 DIAGNOSIS — D631 Anemia in chronic kidney disease: Secondary | ICD-10-CM | POA: Diagnosis not present

## 2016-07-19 DIAGNOSIS — N2581 Secondary hyperparathyroidism of renal origin: Secondary | ICD-10-CM | POA: Diagnosis not present

## 2016-07-19 DIAGNOSIS — Z992 Dependence on renal dialysis: Secondary | ICD-10-CM | POA: Diagnosis not present

## 2016-07-19 DIAGNOSIS — N186 End stage renal disease: Secondary | ICD-10-CM | POA: Diagnosis not present

## 2016-07-21 DIAGNOSIS — D509 Iron deficiency anemia, unspecified: Secondary | ICD-10-CM | POA: Diagnosis not present

## 2016-07-21 DIAGNOSIS — N186 End stage renal disease: Secondary | ICD-10-CM | POA: Diagnosis not present

## 2016-07-21 DIAGNOSIS — Z992 Dependence on renal dialysis: Secondary | ICD-10-CM | POA: Diagnosis not present

## 2016-07-21 DIAGNOSIS — D631 Anemia in chronic kidney disease: Secondary | ICD-10-CM | POA: Diagnosis not present

## 2016-07-21 DIAGNOSIS — N2581 Secondary hyperparathyroidism of renal origin: Secondary | ICD-10-CM | POA: Diagnosis not present

## 2016-07-22 DIAGNOSIS — N186 End stage renal disease: Secondary | ICD-10-CM | POA: Diagnosis not present

## 2016-07-22 DIAGNOSIS — Z992 Dependence on renal dialysis: Secondary | ICD-10-CM | POA: Diagnosis not present

## 2016-07-24 DIAGNOSIS — Z992 Dependence on renal dialysis: Secondary | ICD-10-CM | POA: Diagnosis not present

## 2016-07-24 DIAGNOSIS — N2581 Secondary hyperparathyroidism of renal origin: Secondary | ICD-10-CM | POA: Diagnosis not present

## 2016-07-24 DIAGNOSIS — D631 Anemia in chronic kidney disease: Secondary | ICD-10-CM | POA: Diagnosis not present

## 2016-07-24 DIAGNOSIS — N186 End stage renal disease: Secondary | ICD-10-CM | POA: Diagnosis not present

## 2016-07-24 DIAGNOSIS — D509 Iron deficiency anemia, unspecified: Secondary | ICD-10-CM | POA: Diagnosis not present

## 2016-07-26 DIAGNOSIS — Z992 Dependence on renal dialysis: Secondary | ICD-10-CM | POA: Diagnosis not present

## 2016-07-26 DIAGNOSIS — N186 End stage renal disease: Secondary | ICD-10-CM | POA: Diagnosis not present

## 2016-07-26 DIAGNOSIS — D509 Iron deficiency anemia, unspecified: Secondary | ICD-10-CM | POA: Diagnosis not present

## 2016-07-26 DIAGNOSIS — N2581 Secondary hyperparathyroidism of renal origin: Secondary | ICD-10-CM | POA: Diagnosis not present

## 2016-07-26 DIAGNOSIS — D631 Anemia in chronic kidney disease: Secondary | ICD-10-CM | POA: Diagnosis not present

## 2016-07-28 DIAGNOSIS — D631 Anemia in chronic kidney disease: Secondary | ICD-10-CM | POA: Diagnosis not present

## 2016-07-28 DIAGNOSIS — D509 Iron deficiency anemia, unspecified: Secondary | ICD-10-CM | POA: Diagnosis not present

## 2016-07-28 DIAGNOSIS — N186 End stage renal disease: Secondary | ICD-10-CM | POA: Diagnosis not present

## 2016-07-28 DIAGNOSIS — Z992 Dependence on renal dialysis: Secondary | ICD-10-CM | POA: Diagnosis not present

## 2016-07-28 DIAGNOSIS — N2581 Secondary hyperparathyroidism of renal origin: Secondary | ICD-10-CM | POA: Diagnosis not present

## 2016-07-31 DIAGNOSIS — D631 Anemia in chronic kidney disease: Secondary | ICD-10-CM | POA: Diagnosis not present

## 2016-07-31 DIAGNOSIS — Z992 Dependence on renal dialysis: Secondary | ICD-10-CM | POA: Diagnosis not present

## 2016-07-31 DIAGNOSIS — N2581 Secondary hyperparathyroidism of renal origin: Secondary | ICD-10-CM | POA: Diagnosis not present

## 2016-07-31 DIAGNOSIS — D509 Iron deficiency anemia, unspecified: Secondary | ICD-10-CM | POA: Diagnosis not present

## 2016-07-31 DIAGNOSIS — N186 End stage renal disease: Secondary | ICD-10-CM | POA: Diagnosis not present

## 2016-08-02 DIAGNOSIS — Z992 Dependence on renal dialysis: Secondary | ICD-10-CM | POA: Diagnosis not present

## 2016-08-02 DIAGNOSIS — D509 Iron deficiency anemia, unspecified: Secondary | ICD-10-CM | POA: Diagnosis not present

## 2016-08-02 DIAGNOSIS — D631 Anemia in chronic kidney disease: Secondary | ICD-10-CM | POA: Diagnosis not present

## 2016-08-02 DIAGNOSIS — N2581 Secondary hyperparathyroidism of renal origin: Secondary | ICD-10-CM | POA: Diagnosis not present

## 2016-08-02 DIAGNOSIS — N186 End stage renal disease: Secondary | ICD-10-CM | POA: Diagnosis not present

## 2016-08-04 DIAGNOSIS — N186 End stage renal disease: Secondary | ICD-10-CM | POA: Diagnosis not present

## 2016-08-04 DIAGNOSIS — Z992 Dependence on renal dialysis: Secondary | ICD-10-CM | POA: Diagnosis not present

## 2016-08-04 DIAGNOSIS — D509 Iron deficiency anemia, unspecified: Secondary | ICD-10-CM | POA: Diagnosis not present

## 2016-08-04 DIAGNOSIS — D631 Anemia in chronic kidney disease: Secondary | ICD-10-CM | POA: Diagnosis not present

## 2016-08-04 DIAGNOSIS — N2581 Secondary hyperparathyroidism of renal origin: Secondary | ICD-10-CM | POA: Diagnosis not present

## 2016-08-07 DIAGNOSIS — Z992 Dependence on renal dialysis: Secondary | ICD-10-CM | POA: Diagnosis not present

## 2016-08-07 DIAGNOSIS — N2581 Secondary hyperparathyroidism of renal origin: Secondary | ICD-10-CM | POA: Diagnosis not present

## 2016-08-07 DIAGNOSIS — N186 End stage renal disease: Secondary | ICD-10-CM | POA: Diagnosis not present

## 2016-08-07 DIAGNOSIS — D509 Iron deficiency anemia, unspecified: Secondary | ICD-10-CM | POA: Diagnosis not present

## 2016-08-07 DIAGNOSIS — D631 Anemia in chronic kidney disease: Secondary | ICD-10-CM | POA: Diagnosis not present

## 2016-08-09 DIAGNOSIS — D631 Anemia in chronic kidney disease: Secondary | ICD-10-CM | POA: Diagnosis not present

## 2016-08-09 DIAGNOSIS — N186 End stage renal disease: Secondary | ICD-10-CM | POA: Diagnosis not present

## 2016-08-09 DIAGNOSIS — Z992 Dependence on renal dialysis: Secondary | ICD-10-CM | POA: Diagnosis not present

## 2016-08-09 DIAGNOSIS — N2581 Secondary hyperparathyroidism of renal origin: Secondary | ICD-10-CM | POA: Diagnosis not present

## 2016-08-09 DIAGNOSIS — D509 Iron deficiency anemia, unspecified: Secondary | ICD-10-CM | POA: Diagnosis not present

## 2016-08-11 DIAGNOSIS — D509 Iron deficiency anemia, unspecified: Secondary | ICD-10-CM | POA: Diagnosis not present

## 2016-08-11 DIAGNOSIS — N2581 Secondary hyperparathyroidism of renal origin: Secondary | ICD-10-CM | POA: Diagnosis not present

## 2016-08-11 DIAGNOSIS — Z992 Dependence on renal dialysis: Secondary | ICD-10-CM | POA: Diagnosis not present

## 2016-08-11 DIAGNOSIS — D631 Anemia in chronic kidney disease: Secondary | ICD-10-CM | POA: Diagnosis not present

## 2016-08-11 DIAGNOSIS — N186 End stage renal disease: Secondary | ICD-10-CM | POA: Diagnosis not present

## 2016-08-14 DIAGNOSIS — N186 End stage renal disease: Secondary | ICD-10-CM | POA: Diagnosis not present

## 2016-08-14 DIAGNOSIS — N2581 Secondary hyperparathyroidism of renal origin: Secondary | ICD-10-CM | POA: Diagnosis not present

## 2016-08-14 DIAGNOSIS — D509 Iron deficiency anemia, unspecified: Secondary | ICD-10-CM | POA: Diagnosis not present

## 2016-08-14 DIAGNOSIS — D631 Anemia in chronic kidney disease: Secondary | ICD-10-CM | POA: Diagnosis not present

## 2016-08-14 DIAGNOSIS — Z992 Dependence on renal dialysis: Secondary | ICD-10-CM | POA: Diagnosis not present

## 2016-08-16 DIAGNOSIS — Z992 Dependence on renal dialysis: Secondary | ICD-10-CM | POA: Diagnosis not present

## 2016-08-16 DIAGNOSIS — N186 End stage renal disease: Secondary | ICD-10-CM | POA: Diagnosis not present

## 2016-08-16 DIAGNOSIS — D509 Iron deficiency anemia, unspecified: Secondary | ICD-10-CM | POA: Diagnosis not present

## 2016-08-16 DIAGNOSIS — N2581 Secondary hyperparathyroidism of renal origin: Secondary | ICD-10-CM | POA: Diagnosis not present

## 2016-08-16 DIAGNOSIS — D631 Anemia in chronic kidney disease: Secondary | ICD-10-CM | POA: Diagnosis not present

## 2016-08-17 DIAGNOSIS — E1129 Type 2 diabetes mellitus with other diabetic kidney complication: Secondary | ICD-10-CM | POA: Diagnosis not present

## 2016-08-17 DIAGNOSIS — M545 Low back pain: Secondary | ICD-10-CM | POA: Diagnosis not present

## 2016-08-17 DIAGNOSIS — I10 Essential (primary) hypertension: Secondary | ICD-10-CM | POA: Diagnosis not present

## 2016-08-17 DIAGNOSIS — F0281 Dementia in other diseases classified elsewhere with behavioral disturbance: Secondary | ICD-10-CM | POA: Diagnosis not present

## 2016-08-17 DIAGNOSIS — N185 Chronic kidney disease, stage 5: Secondary | ICD-10-CM | POA: Diagnosis not present

## 2016-08-18 DIAGNOSIS — N186 End stage renal disease: Secondary | ICD-10-CM | POA: Diagnosis not present

## 2016-08-18 DIAGNOSIS — N2581 Secondary hyperparathyroidism of renal origin: Secondary | ICD-10-CM | POA: Diagnosis not present

## 2016-08-18 DIAGNOSIS — D631 Anemia in chronic kidney disease: Secondary | ICD-10-CM | POA: Diagnosis not present

## 2016-08-18 DIAGNOSIS — D509 Iron deficiency anemia, unspecified: Secondary | ICD-10-CM | POA: Diagnosis not present

## 2016-08-18 DIAGNOSIS — Z992 Dependence on renal dialysis: Secondary | ICD-10-CM | POA: Diagnosis not present

## 2016-08-21 DIAGNOSIS — D631 Anemia in chronic kidney disease: Secondary | ICD-10-CM | POA: Diagnosis not present

## 2016-08-21 DIAGNOSIS — Z992 Dependence on renal dialysis: Secondary | ICD-10-CM | POA: Diagnosis not present

## 2016-08-21 DIAGNOSIS — N186 End stage renal disease: Secondary | ICD-10-CM | POA: Diagnosis not present

## 2016-08-21 DIAGNOSIS — D509 Iron deficiency anemia, unspecified: Secondary | ICD-10-CM | POA: Diagnosis not present

## 2016-08-21 DIAGNOSIS — N2581 Secondary hyperparathyroidism of renal origin: Secondary | ICD-10-CM | POA: Diagnosis not present

## 2016-08-22 DIAGNOSIS — Z992 Dependence on renal dialysis: Secondary | ICD-10-CM | POA: Diagnosis not present

## 2016-08-22 DIAGNOSIS — N186 End stage renal disease: Secondary | ICD-10-CM | POA: Diagnosis not present

## 2016-08-23 DIAGNOSIS — N2581 Secondary hyperparathyroidism of renal origin: Secondary | ICD-10-CM | POA: Diagnosis not present

## 2016-08-23 DIAGNOSIS — Z992 Dependence on renal dialysis: Secondary | ICD-10-CM | POA: Diagnosis not present

## 2016-08-23 DIAGNOSIS — D509 Iron deficiency anemia, unspecified: Secondary | ICD-10-CM | POA: Diagnosis not present

## 2016-08-23 DIAGNOSIS — N186 End stage renal disease: Secondary | ICD-10-CM | POA: Diagnosis not present

## 2016-08-23 DIAGNOSIS — D631 Anemia in chronic kidney disease: Secondary | ICD-10-CM | POA: Diagnosis not present

## 2016-08-25 DIAGNOSIS — D509 Iron deficiency anemia, unspecified: Secondary | ICD-10-CM | POA: Diagnosis not present

## 2016-08-25 DIAGNOSIS — Z992 Dependence on renal dialysis: Secondary | ICD-10-CM | POA: Diagnosis not present

## 2016-08-25 DIAGNOSIS — N2581 Secondary hyperparathyroidism of renal origin: Secondary | ICD-10-CM | POA: Diagnosis not present

## 2016-08-25 DIAGNOSIS — D631 Anemia in chronic kidney disease: Secondary | ICD-10-CM | POA: Diagnosis not present

## 2016-08-25 DIAGNOSIS — N186 End stage renal disease: Secondary | ICD-10-CM | POA: Diagnosis not present

## 2016-08-28 DIAGNOSIS — D631 Anemia in chronic kidney disease: Secondary | ICD-10-CM | POA: Diagnosis not present

## 2016-08-28 DIAGNOSIS — D509 Iron deficiency anemia, unspecified: Secondary | ICD-10-CM | POA: Diagnosis not present

## 2016-08-28 DIAGNOSIS — N186 End stage renal disease: Secondary | ICD-10-CM | POA: Diagnosis not present

## 2016-08-28 DIAGNOSIS — Z992 Dependence on renal dialysis: Secondary | ICD-10-CM | POA: Diagnosis not present

## 2016-08-28 DIAGNOSIS — N2581 Secondary hyperparathyroidism of renal origin: Secondary | ICD-10-CM | POA: Diagnosis not present

## 2016-08-30 DIAGNOSIS — D509 Iron deficiency anemia, unspecified: Secondary | ICD-10-CM | POA: Diagnosis not present

## 2016-08-30 DIAGNOSIS — N2581 Secondary hyperparathyroidism of renal origin: Secondary | ICD-10-CM | POA: Diagnosis not present

## 2016-08-30 DIAGNOSIS — D631 Anemia in chronic kidney disease: Secondary | ICD-10-CM | POA: Diagnosis not present

## 2016-08-30 DIAGNOSIS — Z992 Dependence on renal dialysis: Secondary | ICD-10-CM | POA: Diagnosis not present

## 2016-08-30 DIAGNOSIS — N186 End stage renal disease: Secondary | ICD-10-CM | POA: Diagnosis not present

## 2016-09-01 DIAGNOSIS — M810 Age-related osteoporosis without current pathological fracture: Secondary | ICD-10-CM | POA: Diagnosis present

## 2016-09-01 DIAGNOSIS — Z880 Allergy status to penicillin: Secondary | ICD-10-CM | POA: Diagnosis not present

## 2016-09-01 DIAGNOSIS — S42215A Unspecified nondisplaced fracture of surgical neck of left humerus, initial encounter for closed fracture: Secondary | ICD-10-CM | POA: Diagnosis not present

## 2016-09-01 DIAGNOSIS — I25118 Atherosclerotic heart disease of native coronary artery with other forms of angina pectoris: Secondary | ICD-10-CM | POA: Diagnosis not present

## 2016-09-01 DIAGNOSIS — R278 Other lack of coordination: Secondary | ICD-10-CM | POA: Diagnosis not present

## 2016-09-01 DIAGNOSIS — K219 Gastro-esophageal reflux disease without esophagitis: Secondary | ICD-10-CM | POA: Diagnosis not present

## 2016-09-01 DIAGNOSIS — T402X5A Adverse effect of other opioids, initial encounter: Secondary | ICD-10-CM | POA: Diagnosis not present

## 2016-09-01 DIAGNOSIS — N186 End stage renal disease: Secondary | ICD-10-CM | POA: Diagnosis not present

## 2016-09-01 DIAGNOSIS — I2510 Atherosclerotic heart disease of native coronary artery without angina pectoris: Secondary | ICD-10-CM | POA: Diagnosis present

## 2016-09-01 DIAGNOSIS — Z8249 Family history of ischemic heart disease and other diseases of the circulatory system: Secondary | ICD-10-CM | POA: Diagnosis not present

## 2016-09-01 DIAGNOSIS — Z8673 Personal history of transient ischemic attack (TIA), and cerebral infarction without residual deficits: Secondary | ICD-10-CM | POA: Diagnosis not present

## 2016-09-01 DIAGNOSIS — Z4789 Encounter for other orthopedic aftercare: Secondary | ICD-10-CM | POA: Diagnosis not present

## 2016-09-01 DIAGNOSIS — M6281 Muscle weakness (generalized): Secondary | ICD-10-CM | POA: Diagnosis not present

## 2016-09-01 DIAGNOSIS — S42295A Other nondisplaced fracture of upper end of left humerus, initial encounter for closed fracture: Secondary | ICD-10-CM | POA: Diagnosis not present

## 2016-09-01 DIAGNOSIS — Z833 Family history of diabetes mellitus: Secondary | ICD-10-CM | POA: Diagnosis not present

## 2016-09-01 DIAGNOSIS — R262 Difficulty in walking, not elsewhere classified: Secondary | ICD-10-CM | POA: Diagnosis not present

## 2016-09-01 DIAGNOSIS — W07XXXA Fall from chair, initial encounter: Secondary | ICD-10-CM | POA: Diagnosis not present

## 2016-09-01 DIAGNOSIS — Z7982 Long term (current) use of aspirin: Secondary | ICD-10-CM | POA: Diagnosis not present

## 2016-09-01 DIAGNOSIS — S42202D Unspecified fracture of upper end of left humerus, subsequent encounter for fracture with routine healing: Secondary | ICD-10-CM | POA: Diagnosis not present

## 2016-09-01 DIAGNOSIS — D638 Anemia in other chronic diseases classified elsewhere: Secondary | ICD-10-CM | POA: Diagnosis present

## 2016-09-01 DIAGNOSIS — I10 Essential (primary) hypertension: Secondary | ICD-10-CM | POA: Diagnosis not present

## 2016-09-01 DIAGNOSIS — M25512 Pain in left shoulder: Secondary | ICD-10-CM | POA: Diagnosis not present

## 2016-09-01 DIAGNOSIS — T8612 Kidney transplant failure: Secondary | ICD-10-CM | POA: Diagnosis not present

## 2016-09-01 DIAGNOSIS — S42202A Unspecified fracture of upper end of left humerus, initial encounter for closed fracture: Secondary | ICD-10-CM | POA: Diagnosis not present

## 2016-09-01 DIAGNOSIS — E1122 Type 2 diabetes mellitus with diabetic chronic kidney disease: Secondary | ICD-10-CM | POA: Diagnosis present

## 2016-09-01 DIAGNOSIS — M1990 Unspecified osteoarthritis, unspecified site: Secondary | ICD-10-CM | POA: Diagnosis not present

## 2016-09-01 DIAGNOSIS — M47896 Other spondylosis, lumbar region: Secondary | ICD-10-CM | POA: Diagnosis present

## 2016-09-01 DIAGNOSIS — W1789XA Other fall from one level to another, initial encounter: Secondary | ICD-10-CM | POA: Diagnosis not present

## 2016-09-01 DIAGNOSIS — Z79899 Other long term (current) drug therapy: Secondary | ICD-10-CM | POA: Diagnosis not present

## 2016-09-01 DIAGNOSIS — Z79891 Long term (current) use of opiate analgesic: Secondary | ICD-10-CM | POA: Diagnosis not present

## 2016-09-01 DIAGNOSIS — R410 Disorientation, unspecified: Secondary | ICD-10-CM | POA: Diagnosis not present

## 2016-09-01 DIAGNOSIS — R2689 Other abnormalities of gait and mobility: Secondary | ICD-10-CM | POA: Diagnosis not present

## 2016-09-01 DIAGNOSIS — Z87891 Personal history of nicotine dependence: Secondary | ICD-10-CM | POA: Diagnosis not present

## 2016-09-01 DIAGNOSIS — I7389 Other specified peripheral vascular diseases: Secondary | ICD-10-CM | POA: Diagnosis not present

## 2016-09-01 DIAGNOSIS — E1129 Type 2 diabetes mellitus with other diabetic kidney complication: Secondary | ICD-10-CM | POA: Diagnosis not present

## 2016-09-01 DIAGNOSIS — E1151 Type 2 diabetes mellitus with diabetic peripheral angiopathy without gangrene: Secondary | ICD-10-CM | POA: Diagnosis present

## 2016-09-01 DIAGNOSIS — M5136 Other intervertebral disc degeneration, lumbar region: Secondary | ICD-10-CM | POA: Diagnosis not present

## 2016-09-01 DIAGNOSIS — Z992 Dependence on renal dialysis: Secondary | ICD-10-CM | POA: Diagnosis not present

## 2016-09-01 DIAGNOSIS — Z955 Presence of coronary angioplasty implant and graft: Secondary | ICD-10-CM | POA: Diagnosis not present

## 2016-09-01 DIAGNOSIS — R531 Weakness: Secondary | ICD-10-CM | POA: Diagnosis not present

## 2016-09-01 DIAGNOSIS — E119 Type 2 diabetes mellitus without complications: Secondary | ICD-10-CM | POA: Diagnosis not present

## 2016-09-01 DIAGNOSIS — S42212A Unspecified displaced fracture of surgical neck of left humerus, initial encounter for closed fracture: Secondary | ICD-10-CM | POA: Diagnosis not present

## 2016-09-01 DIAGNOSIS — Z91048 Other nonmedicinal substance allergy status: Secondary | ICD-10-CM | POA: Diagnosis not present

## 2016-09-01 DIAGNOSIS — S42302A Unspecified fracture of shaft of humerus, left arm, initial encounter for closed fracture: Secondary | ICD-10-CM | POA: Diagnosis not present

## 2016-09-08 DIAGNOSIS — M1990 Unspecified osteoarthritis, unspecified site: Secondary | ICD-10-CM | POA: Diagnosis not present

## 2016-09-08 DIAGNOSIS — D631 Anemia in chronic kidney disease: Secondary | ICD-10-CM | POA: Diagnosis not present

## 2016-09-08 DIAGNOSIS — I25118 Atherosclerotic heart disease of native coronary artery with other forms of angina pectoris: Secondary | ICD-10-CM | POA: Diagnosis not present

## 2016-09-08 DIAGNOSIS — M6281 Muscle weakness (generalized): Secondary | ICD-10-CM | POA: Diagnosis not present

## 2016-09-08 DIAGNOSIS — I7389 Other specified peripheral vascular diseases: Secondary | ICD-10-CM | POA: Diagnosis not present

## 2016-09-08 DIAGNOSIS — S42302A Unspecified fracture of shaft of humerus, left arm, initial encounter for closed fracture: Secondary | ICD-10-CM | POA: Diagnosis not present

## 2016-09-08 DIAGNOSIS — S42202D Unspecified fracture of upper end of left humerus, subsequent encounter for fracture with routine healing: Secondary | ICD-10-CM | POA: Diagnosis not present

## 2016-09-08 DIAGNOSIS — T402X5A Adverse effect of other opioids, initial encounter: Secondary | ICD-10-CM | POA: Diagnosis not present

## 2016-09-08 DIAGNOSIS — Z91048 Other nonmedicinal substance allergy status: Secondary | ICD-10-CM | POA: Diagnosis not present

## 2016-09-08 DIAGNOSIS — Z743 Need for continuous supervision: Secondary | ICD-10-CM | POA: Diagnosis not present

## 2016-09-08 DIAGNOSIS — R531 Weakness: Secondary | ICD-10-CM | POA: Diagnosis not present

## 2016-09-08 DIAGNOSIS — Z7982 Long term (current) use of aspirin: Secondary | ICD-10-CM | POA: Diagnosis not present

## 2016-09-08 DIAGNOSIS — N2581 Secondary hyperparathyroidism of renal origin: Secondary | ICD-10-CM | POA: Diagnosis not present

## 2016-09-08 DIAGNOSIS — I10 Essential (primary) hypertension: Secondary | ICD-10-CM | POA: Diagnosis not present

## 2016-09-08 DIAGNOSIS — Z4789 Encounter for other orthopedic aftercare: Secondary | ICD-10-CM | POA: Diagnosis not present

## 2016-09-08 DIAGNOSIS — E1129 Type 2 diabetes mellitus with other diabetic kidney complication: Secondary | ICD-10-CM | POA: Diagnosis not present

## 2016-09-08 DIAGNOSIS — I259 Chronic ischemic heart disease, unspecified: Secondary | ICD-10-CM | POA: Diagnosis not present

## 2016-09-08 DIAGNOSIS — M5136 Other intervertebral disc degeneration, lumbar region: Secondary | ICD-10-CM | POA: Diagnosis not present

## 2016-09-08 DIAGNOSIS — R2689 Other abnormalities of gait and mobility: Secondary | ICD-10-CM | POA: Diagnosis not present

## 2016-09-08 DIAGNOSIS — I2510 Atherosclerotic heart disease of native coronary artery without angina pectoris: Secondary | ICD-10-CM | POA: Diagnosis not present

## 2016-09-08 DIAGNOSIS — Z87891 Personal history of nicotine dependence: Secondary | ICD-10-CM | POA: Diagnosis not present

## 2016-09-08 DIAGNOSIS — E1151 Type 2 diabetes mellitus with diabetic peripheral angiopathy without gangrene: Secondary | ICD-10-CM | POA: Diagnosis not present

## 2016-09-08 DIAGNOSIS — R279 Unspecified lack of coordination: Secondary | ICD-10-CM | POA: Diagnosis not present

## 2016-09-08 DIAGNOSIS — E119 Type 2 diabetes mellitus without complications: Secondary | ICD-10-CM | POA: Diagnosis not present

## 2016-09-08 DIAGNOSIS — N185 Chronic kidney disease, stage 5: Secondary | ICD-10-CM | POA: Diagnosis not present

## 2016-09-08 DIAGNOSIS — R7989 Other specified abnormal findings of blood chemistry: Secondary | ICD-10-CM | POA: Diagnosis not present

## 2016-09-08 DIAGNOSIS — N186 End stage renal disease: Secondary | ICD-10-CM | POA: Diagnosis not present

## 2016-09-08 DIAGNOSIS — K219 Gastro-esophageal reflux disease without esophagitis: Secondary | ICD-10-CM | POA: Diagnosis not present

## 2016-09-08 DIAGNOSIS — T8612 Kidney transplant failure: Secondary | ICD-10-CM | POA: Diagnosis not present

## 2016-09-08 DIAGNOSIS — W07XXXA Fall from chair, initial encounter: Secondary | ICD-10-CM | POA: Diagnosis not present

## 2016-09-08 DIAGNOSIS — R278 Other lack of coordination: Secondary | ICD-10-CM | POA: Diagnosis not present

## 2016-09-08 DIAGNOSIS — Z794 Long term (current) use of insulin: Secondary | ICD-10-CM | POA: Diagnosis not present

## 2016-09-08 DIAGNOSIS — Z79899 Other long term (current) drug therapy: Secondary | ICD-10-CM | POA: Diagnosis not present

## 2016-09-08 DIAGNOSIS — Z8673 Personal history of transient ischemic attack (TIA), and cerebral infarction without residual deficits: Secondary | ICD-10-CM | POA: Diagnosis not present

## 2016-09-08 DIAGNOSIS — R262 Difficulty in walking, not elsewhere classified: Secondary | ICD-10-CM | POA: Diagnosis not present

## 2016-09-08 DIAGNOSIS — R7889 Finding of other specified substances, not normally found in blood: Secondary | ICD-10-CM | POA: Diagnosis not present

## 2016-09-08 DIAGNOSIS — D509 Iron deficiency anemia, unspecified: Secondary | ICD-10-CM | POA: Diagnosis not present

## 2016-09-08 DIAGNOSIS — Z992 Dependence on renal dialysis: Secondary | ICD-10-CM | POA: Diagnosis not present

## 2016-09-08 DIAGNOSIS — S42212D Unspecified displaced fracture of surgical neck of left humerus, subsequent encounter for fracture with routine healing: Secondary | ICD-10-CM | POA: Diagnosis not present

## 2016-09-08 DIAGNOSIS — Z23 Encounter for immunization: Secondary | ICD-10-CM | POA: Diagnosis not present

## 2016-09-08 DIAGNOSIS — R0902 Hypoxemia: Secondary | ICD-10-CM | POA: Diagnosis not present

## 2016-09-08 DIAGNOSIS — S42295D Other nondisplaced fracture of upper end of left humerus, subsequent encounter for fracture with routine healing: Secondary | ICD-10-CM | POA: Diagnosis not present

## 2016-09-08 DIAGNOSIS — S42295A Other nondisplaced fracture of upper end of left humerus, initial encounter for closed fracture: Secondary | ICD-10-CM | POA: Diagnosis not present

## 2016-09-08 DIAGNOSIS — E1122 Type 2 diabetes mellitus with diabetic chronic kidney disease: Secondary | ICD-10-CM | POA: Diagnosis not present

## 2016-09-08 DIAGNOSIS — S42215A Unspecified nondisplaced fracture of surgical neck of left humerus, initial encounter for closed fracture: Secondary | ICD-10-CM | POA: Diagnosis not present

## 2016-09-08 DIAGNOSIS — Z880 Allergy status to penicillin: Secondary | ICD-10-CM | POA: Diagnosis not present

## 2016-09-08 DIAGNOSIS — R410 Disorientation, unspecified: Secondary | ICD-10-CM | POA: Diagnosis not present

## 2016-09-11 DIAGNOSIS — N2581 Secondary hyperparathyroidism of renal origin: Secondary | ICD-10-CM | POA: Diagnosis not present

## 2016-09-11 DIAGNOSIS — Z992 Dependence on renal dialysis: Secondary | ICD-10-CM | POA: Diagnosis not present

## 2016-09-11 DIAGNOSIS — N186 End stage renal disease: Secondary | ICD-10-CM | POA: Diagnosis not present

## 2016-09-11 DIAGNOSIS — D631 Anemia in chronic kidney disease: Secondary | ICD-10-CM | POA: Diagnosis not present

## 2016-09-11 DIAGNOSIS — D509 Iron deficiency anemia, unspecified: Secondary | ICD-10-CM | POA: Diagnosis not present

## 2016-09-12 DIAGNOSIS — Z7982 Long term (current) use of aspirin: Secondary | ICD-10-CM | POA: Diagnosis not present

## 2016-09-12 DIAGNOSIS — R7989 Other specified abnormal findings of blood chemistry: Secondary | ICD-10-CM | POA: Diagnosis not present

## 2016-09-12 DIAGNOSIS — Z79899 Other long term (current) drug therapy: Secondary | ICD-10-CM | POA: Diagnosis not present

## 2016-09-12 DIAGNOSIS — E119 Type 2 diabetes mellitus without complications: Secondary | ICD-10-CM | POA: Diagnosis not present

## 2016-09-12 DIAGNOSIS — I10 Essential (primary) hypertension: Secondary | ICD-10-CM | POA: Diagnosis not present

## 2016-09-12 DIAGNOSIS — Z8673 Personal history of transient ischemic attack (TIA), and cerebral infarction without residual deficits: Secondary | ICD-10-CM | POA: Diagnosis not present

## 2016-09-13 DIAGNOSIS — N186 End stage renal disease: Secondary | ICD-10-CM | POA: Diagnosis not present

## 2016-09-13 DIAGNOSIS — Z992 Dependence on renal dialysis: Secondary | ICD-10-CM | POA: Diagnosis not present

## 2016-09-13 DIAGNOSIS — D631 Anemia in chronic kidney disease: Secondary | ICD-10-CM | POA: Diagnosis not present

## 2016-09-13 DIAGNOSIS — D509 Iron deficiency anemia, unspecified: Secondary | ICD-10-CM | POA: Diagnosis not present

## 2016-09-13 DIAGNOSIS — N2581 Secondary hyperparathyroidism of renal origin: Secondary | ICD-10-CM | POA: Diagnosis not present

## 2016-09-15 DIAGNOSIS — D631 Anemia in chronic kidney disease: Secondary | ICD-10-CM | POA: Diagnosis not present

## 2016-09-15 DIAGNOSIS — N186 End stage renal disease: Secondary | ICD-10-CM | POA: Diagnosis not present

## 2016-09-15 DIAGNOSIS — D509 Iron deficiency anemia, unspecified: Secondary | ICD-10-CM | POA: Diagnosis not present

## 2016-09-15 DIAGNOSIS — N2581 Secondary hyperparathyroidism of renal origin: Secondary | ICD-10-CM | POA: Diagnosis not present

## 2016-09-15 DIAGNOSIS — Z992 Dependence on renal dialysis: Secondary | ICD-10-CM | POA: Diagnosis not present

## 2016-09-18 DIAGNOSIS — N186 End stage renal disease: Secondary | ICD-10-CM | POA: Diagnosis not present

## 2016-09-18 DIAGNOSIS — N2581 Secondary hyperparathyroidism of renal origin: Secondary | ICD-10-CM | POA: Diagnosis not present

## 2016-09-18 DIAGNOSIS — Z992 Dependence on renal dialysis: Secondary | ICD-10-CM | POA: Diagnosis not present

## 2016-09-18 DIAGNOSIS — D631 Anemia in chronic kidney disease: Secondary | ICD-10-CM | POA: Diagnosis not present

## 2016-09-18 DIAGNOSIS — D509 Iron deficiency anemia, unspecified: Secondary | ICD-10-CM | POA: Diagnosis not present

## 2016-09-20 DIAGNOSIS — D631 Anemia in chronic kidney disease: Secondary | ICD-10-CM | POA: Diagnosis not present

## 2016-09-20 DIAGNOSIS — Z992 Dependence on renal dialysis: Secondary | ICD-10-CM | POA: Diagnosis not present

## 2016-09-20 DIAGNOSIS — N186 End stage renal disease: Secondary | ICD-10-CM | POA: Diagnosis not present

## 2016-09-20 DIAGNOSIS — N2581 Secondary hyperparathyroidism of renal origin: Secondary | ICD-10-CM | POA: Diagnosis not present

## 2016-09-20 DIAGNOSIS — D509 Iron deficiency anemia, unspecified: Secondary | ICD-10-CM | POA: Diagnosis not present

## 2016-09-21 ENCOUNTER — Ambulatory Visit: Payer: Medicare Other | Admitting: Gastroenterology

## 2016-09-21 DIAGNOSIS — Z992 Dependence on renal dialysis: Secondary | ICD-10-CM | POA: Diagnosis not present

## 2016-09-21 DIAGNOSIS — N186 End stage renal disease: Secondary | ICD-10-CM | POA: Diagnosis not present

## 2016-09-22 DIAGNOSIS — N186 End stage renal disease: Secondary | ICD-10-CM | POA: Diagnosis not present

## 2016-09-22 DIAGNOSIS — D631 Anemia in chronic kidney disease: Secondary | ICD-10-CM | POA: Diagnosis not present

## 2016-09-22 DIAGNOSIS — D509 Iron deficiency anemia, unspecified: Secondary | ICD-10-CM | POA: Diagnosis not present

## 2016-09-22 DIAGNOSIS — N2581 Secondary hyperparathyroidism of renal origin: Secondary | ICD-10-CM | POA: Diagnosis not present

## 2016-09-22 DIAGNOSIS — Z992 Dependence on renal dialysis: Secondary | ICD-10-CM | POA: Diagnosis not present

## 2016-09-25 DIAGNOSIS — D509 Iron deficiency anemia, unspecified: Secondary | ICD-10-CM | POA: Diagnosis not present

## 2016-09-25 DIAGNOSIS — Z992 Dependence on renal dialysis: Secondary | ICD-10-CM | POA: Diagnosis not present

## 2016-09-25 DIAGNOSIS — N186 End stage renal disease: Secondary | ICD-10-CM | POA: Diagnosis not present

## 2016-09-25 DIAGNOSIS — N2581 Secondary hyperparathyroidism of renal origin: Secondary | ICD-10-CM | POA: Diagnosis not present

## 2016-09-25 DIAGNOSIS — Z23 Encounter for immunization: Secondary | ICD-10-CM | POA: Diagnosis not present

## 2016-09-25 DIAGNOSIS — D631 Anemia in chronic kidney disease: Secondary | ICD-10-CM | POA: Diagnosis not present

## 2016-09-27 DIAGNOSIS — D631 Anemia in chronic kidney disease: Secondary | ICD-10-CM | POA: Diagnosis not present

## 2016-09-27 DIAGNOSIS — N2581 Secondary hyperparathyroidism of renal origin: Secondary | ICD-10-CM | POA: Diagnosis not present

## 2016-09-27 DIAGNOSIS — D509 Iron deficiency anemia, unspecified: Secondary | ICD-10-CM | POA: Diagnosis not present

## 2016-09-27 DIAGNOSIS — Z23 Encounter for immunization: Secondary | ICD-10-CM | POA: Diagnosis not present

## 2016-09-27 DIAGNOSIS — N186 End stage renal disease: Secondary | ICD-10-CM | POA: Diagnosis not present

## 2016-09-27 DIAGNOSIS — Z992 Dependence on renal dialysis: Secondary | ICD-10-CM | POA: Diagnosis not present

## 2016-09-29 DIAGNOSIS — N2581 Secondary hyperparathyroidism of renal origin: Secondary | ICD-10-CM | POA: Diagnosis not present

## 2016-09-29 DIAGNOSIS — N186 End stage renal disease: Secondary | ICD-10-CM | POA: Diagnosis not present

## 2016-09-29 DIAGNOSIS — D631 Anemia in chronic kidney disease: Secondary | ICD-10-CM | POA: Diagnosis not present

## 2016-09-29 DIAGNOSIS — D509 Iron deficiency anemia, unspecified: Secondary | ICD-10-CM | POA: Diagnosis not present

## 2016-09-29 DIAGNOSIS — Z992 Dependence on renal dialysis: Secondary | ICD-10-CM | POA: Diagnosis not present

## 2016-09-29 DIAGNOSIS — Z23 Encounter for immunization: Secondary | ICD-10-CM | POA: Diagnosis not present

## 2016-10-02 DIAGNOSIS — D631 Anemia in chronic kidney disease: Secondary | ICD-10-CM | POA: Diagnosis not present

## 2016-10-02 DIAGNOSIS — N186 End stage renal disease: Secondary | ICD-10-CM | POA: Diagnosis not present

## 2016-10-02 DIAGNOSIS — N2581 Secondary hyperparathyroidism of renal origin: Secondary | ICD-10-CM | POA: Diagnosis not present

## 2016-10-02 DIAGNOSIS — Z23 Encounter for immunization: Secondary | ICD-10-CM | POA: Diagnosis not present

## 2016-10-02 DIAGNOSIS — D509 Iron deficiency anemia, unspecified: Secondary | ICD-10-CM | POA: Diagnosis not present

## 2016-10-02 DIAGNOSIS — Z992 Dependence on renal dialysis: Secondary | ICD-10-CM | POA: Diagnosis not present

## 2016-10-04 DIAGNOSIS — N2581 Secondary hyperparathyroidism of renal origin: Secondary | ICD-10-CM | POA: Diagnosis not present

## 2016-10-04 DIAGNOSIS — Z23 Encounter for immunization: Secondary | ICD-10-CM | POA: Diagnosis not present

## 2016-10-04 DIAGNOSIS — D509 Iron deficiency anemia, unspecified: Secondary | ICD-10-CM | POA: Diagnosis not present

## 2016-10-04 DIAGNOSIS — N186 End stage renal disease: Secondary | ICD-10-CM | POA: Diagnosis not present

## 2016-10-04 DIAGNOSIS — D631 Anemia in chronic kidney disease: Secondary | ICD-10-CM | POA: Diagnosis not present

## 2016-10-04 DIAGNOSIS — Z992 Dependence on renal dialysis: Secondary | ICD-10-CM | POA: Diagnosis not present

## 2016-10-05 DIAGNOSIS — D509 Iron deficiency anemia, unspecified: Secondary | ICD-10-CM | POA: Diagnosis not present

## 2016-10-05 DIAGNOSIS — D631 Anemia in chronic kidney disease: Secondary | ICD-10-CM | POA: Diagnosis not present

## 2016-10-05 DIAGNOSIS — Z992 Dependence on renal dialysis: Secondary | ICD-10-CM | POA: Diagnosis not present

## 2016-10-05 DIAGNOSIS — N2581 Secondary hyperparathyroidism of renal origin: Secondary | ICD-10-CM | POA: Diagnosis not present

## 2016-10-05 DIAGNOSIS — N186 End stage renal disease: Secondary | ICD-10-CM | POA: Diagnosis not present

## 2016-10-05 DIAGNOSIS — Z23 Encounter for immunization: Secondary | ICD-10-CM | POA: Diagnosis not present

## 2016-10-09 DIAGNOSIS — Z992 Dependence on renal dialysis: Secondary | ICD-10-CM | POA: Diagnosis not present

## 2016-10-09 DIAGNOSIS — D631 Anemia in chronic kidney disease: Secondary | ICD-10-CM | POA: Diagnosis not present

## 2016-10-09 DIAGNOSIS — Z23 Encounter for immunization: Secondary | ICD-10-CM | POA: Diagnosis not present

## 2016-10-09 DIAGNOSIS — D509 Iron deficiency anemia, unspecified: Secondary | ICD-10-CM | POA: Diagnosis not present

## 2016-10-09 DIAGNOSIS — N2581 Secondary hyperparathyroidism of renal origin: Secondary | ICD-10-CM | POA: Diagnosis not present

## 2016-10-09 DIAGNOSIS — N186 End stage renal disease: Secondary | ICD-10-CM | POA: Diagnosis not present

## 2016-10-11 DIAGNOSIS — N2581 Secondary hyperparathyroidism of renal origin: Secondary | ICD-10-CM | POA: Diagnosis not present

## 2016-10-11 DIAGNOSIS — N186 End stage renal disease: Secondary | ICD-10-CM | POA: Diagnosis not present

## 2016-10-11 DIAGNOSIS — Z992 Dependence on renal dialysis: Secondary | ICD-10-CM | POA: Diagnosis not present

## 2016-10-11 DIAGNOSIS — Z23 Encounter for immunization: Secondary | ICD-10-CM | POA: Diagnosis not present

## 2016-10-11 DIAGNOSIS — D509 Iron deficiency anemia, unspecified: Secondary | ICD-10-CM | POA: Diagnosis not present

## 2016-10-11 DIAGNOSIS — D631 Anemia in chronic kidney disease: Secondary | ICD-10-CM | POA: Diagnosis not present

## 2016-10-13 DIAGNOSIS — Z992 Dependence on renal dialysis: Secondary | ICD-10-CM | POA: Diagnosis not present

## 2016-10-13 DIAGNOSIS — N2581 Secondary hyperparathyroidism of renal origin: Secondary | ICD-10-CM | POA: Diagnosis not present

## 2016-10-13 DIAGNOSIS — Z23 Encounter for immunization: Secondary | ICD-10-CM | POA: Diagnosis not present

## 2016-10-13 DIAGNOSIS — D631 Anemia in chronic kidney disease: Secondary | ICD-10-CM | POA: Diagnosis not present

## 2016-10-13 DIAGNOSIS — D509 Iron deficiency anemia, unspecified: Secondary | ICD-10-CM | POA: Diagnosis not present

## 2016-10-13 DIAGNOSIS — N186 End stage renal disease: Secondary | ICD-10-CM | POA: Diagnosis not present

## 2016-10-16 DIAGNOSIS — D509 Iron deficiency anemia, unspecified: Secondary | ICD-10-CM | POA: Diagnosis not present

## 2016-10-16 DIAGNOSIS — Z23 Encounter for immunization: Secondary | ICD-10-CM | POA: Diagnosis not present

## 2016-10-16 DIAGNOSIS — Z992 Dependence on renal dialysis: Secondary | ICD-10-CM | POA: Diagnosis not present

## 2016-10-16 DIAGNOSIS — D631 Anemia in chronic kidney disease: Secondary | ICD-10-CM | POA: Diagnosis not present

## 2016-10-16 DIAGNOSIS — N2581 Secondary hyperparathyroidism of renal origin: Secondary | ICD-10-CM | POA: Diagnosis not present

## 2016-10-16 DIAGNOSIS — N186 End stage renal disease: Secondary | ICD-10-CM | POA: Diagnosis not present

## 2016-10-18 DIAGNOSIS — Z23 Encounter for immunization: Secondary | ICD-10-CM | POA: Diagnosis not present

## 2016-10-18 DIAGNOSIS — Z992 Dependence on renal dialysis: Secondary | ICD-10-CM | POA: Diagnosis not present

## 2016-10-18 DIAGNOSIS — N2581 Secondary hyperparathyroidism of renal origin: Secondary | ICD-10-CM | POA: Diagnosis not present

## 2016-10-18 DIAGNOSIS — D631 Anemia in chronic kidney disease: Secondary | ICD-10-CM | POA: Diagnosis not present

## 2016-10-18 DIAGNOSIS — N186 End stage renal disease: Secondary | ICD-10-CM | POA: Diagnosis not present

## 2016-10-18 DIAGNOSIS — D509 Iron deficiency anemia, unspecified: Secondary | ICD-10-CM | POA: Diagnosis not present

## 2016-10-20 DIAGNOSIS — N2581 Secondary hyperparathyroidism of renal origin: Secondary | ICD-10-CM | POA: Diagnosis not present

## 2016-10-20 DIAGNOSIS — N186 End stage renal disease: Secondary | ICD-10-CM | POA: Diagnosis not present

## 2016-10-20 DIAGNOSIS — D631 Anemia in chronic kidney disease: Secondary | ICD-10-CM | POA: Diagnosis not present

## 2016-10-20 DIAGNOSIS — Z23 Encounter for immunization: Secondary | ICD-10-CM | POA: Diagnosis not present

## 2016-10-20 DIAGNOSIS — Z992 Dependence on renal dialysis: Secondary | ICD-10-CM | POA: Diagnosis not present

## 2016-10-20 DIAGNOSIS — D509 Iron deficiency anemia, unspecified: Secondary | ICD-10-CM | POA: Diagnosis not present

## 2016-10-22 DIAGNOSIS — N186 End stage renal disease: Secondary | ICD-10-CM | POA: Diagnosis not present

## 2016-10-22 DIAGNOSIS — Z992 Dependence on renal dialysis: Secondary | ICD-10-CM | POA: Diagnosis not present

## 2016-10-23 DIAGNOSIS — D509 Iron deficiency anemia, unspecified: Secondary | ICD-10-CM | POA: Diagnosis not present

## 2016-10-23 DIAGNOSIS — N186 End stage renal disease: Secondary | ICD-10-CM | POA: Diagnosis not present

## 2016-10-23 DIAGNOSIS — Z992 Dependence on renal dialysis: Secondary | ICD-10-CM | POA: Diagnosis not present

## 2016-10-23 DIAGNOSIS — N2581 Secondary hyperparathyroidism of renal origin: Secondary | ICD-10-CM | POA: Diagnosis not present

## 2016-10-24 DIAGNOSIS — S42202D Unspecified fracture of upper end of left humerus, subsequent encounter for fracture with routine healing: Secondary | ICD-10-CM | POA: Diagnosis not present

## 2016-10-25 DIAGNOSIS — Z992 Dependence on renal dialysis: Secondary | ICD-10-CM | POA: Diagnosis not present

## 2016-10-25 DIAGNOSIS — N2581 Secondary hyperparathyroidism of renal origin: Secondary | ICD-10-CM | POA: Diagnosis not present

## 2016-10-25 DIAGNOSIS — D509 Iron deficiency anemia, unspecified: Secondary | ICD-10-CM | POA: Diagnosis not present

## 2016-10-25 DIAGNOSIS — N186 End stage renal disease: Secondary | ICD-10-CM | POA: Diagnosis not present

## 2016-10-26 DIAGNOSIS — S42295D Other nondisplaced fracture of upper end of left humerus, subsequent encounter for fracture with routine healing: Secondary | ICD-10-CM | POA: Diagnosis not present

## 2016-10-26 DIAGNOSIS — I7389 Other specified peripheral vascular diseases: Secondary | ICD-10-CM | POA: Diagnosis not present

## 2016-10-26 DIAGNOSIS — E119 Type 2 diabetes mellitus without complications: Secondary | ICD-10-CM | POA: Diagnosis not present

## 2016-10-26 DIAGNOSIS — N185 Chronic kidney disease, stage 5: Secondary | ICD-10-CM | POA: Diagnosis not present

## 2016-10-27 DIAGNOSIS — Z992 Dependence on renal dialysis: Secondary | ICD-10-CM | POA: Diagnosis not present

## 2016-10-27 DIAGNOSIS — D509 Iron deficiency anemia, unspecified: Secondary | ICD-10-CM | POA: Diagnosis not present

## 2016-10-27 DIAGNOSIS — N2581 Secondary hyperparathyroidism of renal origin: Secondary | ICD-10-CM | POA: Diagnosis not present

## 2016-10-27 DIAGNOSIS — N186 End stage renal disease: Secondary | ICD-10-CM | POA: Diagnosis not present

## 2016-10-30 DIAGNOSIS — Z992 Dependence on renal dialysis: Secondary | ICD-10-CM | POA: Diagnosis not present

## 2016-10-30 DIAGNOSIS — D509 Iron deficiency anemia, unspecified: Secondary | ICD-10-CM | POA: Diagnosis not present

## 2016-10-30 DIAGNOSIS — N2581 Secondary hyperparathyroidism of renal origin: Secondary | ICD-10-CM | POA: Diagnosis not present

## 2016-10-30 DIAGNOSIS — N186 End stage renal disease: Secondary | ICD-10-CM | POA: Diagnosis not present

## 2016-11-01 DIAGNOSIS — N2581 Secondary hyperparathyroidism of renal origin: Secondary | ICD-10-CM | POA: Diagnosis not present

## 2016-11-01 DIAGNOSIS — N186 End stage renal disease: Secondary | ICD-10-CM | POA: Diagnosis not present

## 2016-11-01 DIAGNOSIS — Z992 Dependence on renal dialysis: Secondary | ICD-10-CM | POA: Diagnosis not present

## 2016-11-01 DIAGNOSIS — D509 Iron deficiency anemia, unspecified: Secondary | ICD-10-CM | POA: Diagnosis not present

## 2016-11-03 DIAGNOSIS — N186 End stage renal disease: Secondary | ICD-10-CM | POA: Diagnosis not present

## 2016-11-03 DIAGNOSIS — Z992 Dependence on renal dialysis: Secondary | ICD-10-CM | POA: Diagnosis not present

## 2016-11-03 DIAGNOSIS — D509 Iron deficiency anemia, unspecified: Secondary | ICD-10-CM | POA: Diagnosis not present

## 2016-11-03 DIAGNOSIS — N2581 Secondary hyperparathyroidism of renal origin: Secondary | ICD-10-CM | POA: Diagnosis not present

## 2016-11-06 DIAGNOSIS — D509 Iron deficiency anemia, unspecified: Secondary | ICD-10-CM | POA: Diagnosis not present

## 2016-11-06 DIAGNOSIS — N2581 Secondary hyperparathyroidism of renal origin: Secondary | ICD-10-CM | POA: Diagnosis not present

## 2016-11-06 DIAGNOSIS — N186 End stage renal disease: Secondary | ICD-10-CM | POA: Diagnosis not present

## 2016-11-06 DIAGNOSIS — Z992 Dependence on renal dialysis: Secondary | ICD-10-CM | POA: Diagnosis not present

## 2016-11-08 DIAGNOSIS — N2581 Secondary hyperparathyroidism of renal origin: Secondary | ICD-10-CM | POA: Diagnosis not present

## 2016-11-08 DIAGNOSIS — D509 Iron deficiency anemia, unspecified: Secondary | ICD-10-CM | POA: Diagnosis not present

## 2016-11-08 DIAGNOSIS — N186 End stage renal disease: Secondary | ICD-10-CM | POA: Diagnosis not present

## 2016-11-08 DIAGNOSIS — Z992 Dependence on renal dialysis: Secondary | ICD-10-CM | POA: Diagnosis not present

## 2016-11-10 DIAGNOSIS — D509 Iron deficiency anemia, unspecified: Secondary | ICD-10-CM | POA: Diagnosis not present

## 2016-11-10 DIAGNOSIS — Z992 Dependence on renal dialysis: Secondary | ICD-10-CM | POA: Diagnosis not present

## 2016-11-10 DIAGNOSIS — N2581 Secondary hyperparathyroidism of renal origin: Secondary | ICD-10-CM | POA: Diagnosis not present

## 2016-11-10 DIAGNOSIS — N186 End stage renal disease: Secondary | ICD-10-CM | POA: Diagnosis not present

## 2016-11-13 DIAGNOSIS — N2581 Secondary hyperparathyroidism of renal origin: Secondary | ICD-10-CM | POA: Diagnosis not present

## 2016-11-13 DIAGNOSIS — Z992 Dependence on renal dialysis: Secondary | ICD-10-CM | POA: Diagnosis not present

## 2016-11-13 DIAGNOSIS — D509 Iron deficiency anemia, unspecified: Secondary | ICD-10-CM | POA: Diagnosis not present

## 2016-11-13 DIAGNOSIS — N186 End stage renal disease: Secondary | ICD-10-CM | POA: Diagnosis not present

## 2016-11-15 DIAGNOSIS — N2581 Secondary hyperparathyroidism of renal origin: Secondary | ICD-10-CM | POA: Diagnosis not present

## 2016-11-15 DIAGNOSIS — D509 Iron deficiency anemia, unspecified: Secondary | ICD-10-CM | POA: Diagnosis not present

## 2016-11-15 DIAGNOSIS — N186 End stage renal disease: Secondary | ICD-10-CM | POA: Diagnosis not present

## 2016-11-15 DIAGNOSIS — Z992 Dependence on renal dialysis: Secondary | ICD-10-CM | POA: Diagnosis not present

## 2016-11-17 DIAGNOSIS — N2581 Secondary hyperparathyroidism of renal origin: Secondary | ICD-10-CM | POA: Diagnosis not present

## 2016-11-17 DIAGNOSIS — D509 Iron deficiency anemia, unspecified: Secondary | ICD-10-CM | POA: Diagnosis not present

## 2016-11-17 DIAGNOSIS — N186 End stage renal disease: Secondary | ICD-10-CM | POA: Diagnosis not present

## 2016-11-17 DIAGNOSIS — Z992 Dependence on renal dialysis: Secondary | ICD-10-CM | POA: Diagnosis not present

## 2016-11-20 DIAGNOSIS — N2581 Secondary hyperparathyroidism of renal origin: Secondary | ICD-10-CM | POA: Diagnosis not present

## 2016-11-20 DIAGNOSIS — D509 Iron deficiency anemia, unspecified: Secondary | ICD-10-CM | POA: Diagnosis not present

## 2016-11-20 DIAGNOSIS — N186 End stage renal disease: Secondary | ICD-10-CM | POA: Diagnosis not present

## 2016-11-20 DIAGNOSIS — Z992 Dependence on renal dialysis: Secondary | ICD-10-CM | POA: Diagnosis not present

## 2016-11-21 DIAGNOSIS — N186 End stage renal disease: Secondary | ICD-10-CM | POA: Diagnosis not present

## 2016-11-21 DIAGNOSIS — Z992 Dependence on renal dialysis: Secondary | ICD-10-CM | POA: Diagnosis not present

## 2016-11-22 DIAGNOSIS — D509 Iron deficiency anemia, unspecified: Secondary | ICD-10-CM | POA: Diagnosis not present

## 2016-11-22 DIAGNOSIS — N2581 Secondary hyperparathyroidism of renal origin: Secondary | ICD-10-CM | POA: Diagnosis not present

## 2016-11-22 DIAGNOSIS — N186 End stage renal disease: Secondary | ICD-10-CM | POA: Diagnosis not present

## 2016-11-22 DIAGNOSIS — Z992 Dependence on renal dialysis: Secondary | ICD-10-CM | POA: Diagnosis not present

## 2016-11-24 DIAGNOSIS — Z992 Dependence on renal dialysis: Secondary | ICD-10-CM | POA: Diagnosis not present

## 2016-11-24 DIAGNOSIS — D631 Anemia in chronic kidney disease: Secondary | ICD-10-CM | POA: Diagnosis not present

## 2016-11-24 DIAGNOSIS — D509 Iron deficiency anemia, unspecified: Secondary | ICD-10-CM | POA: Diagnosis not present

## 2016-11-24 DIAGNOSIS — N186 End stage renal disease: Secondary | ICD-10-CM | POA: Diagnosis not present

## 2016-11-24 DIAGNOSIS — N2581 Secondary hyperparathyroidism of renal origin: Secondary | ICD-10-CM | POA: Diagnosis not present

## 2016-11-27 DIAGNOSIS — D631 Anemia in chronic kidney disease: Secondary | ICD-10-CM | POA: Diagnosis not present

## 2016-11-27 DIAGNOSIS — Z992 Dependence on renal dialysis: Secondary | ICD-10-CM | POA: Diagnosis not present

## 2016-11-27 DIAGNOSIS — N2581 Secondary hyperparathyroidism of renal origin: Secondary | ICD-10-CM | POA: Diagnosis not present

## 2016-11-27 DIAGNOSIS — N186 End stage renal disease: Secondary | ICD-10-CM | POA: Diagnosis not present

## 2016-11-27 DIAGNOSIS — D509 Iron deficiency anemia, unspecified: Secondary | ICD-10-CM | POA: Diagnosis not present

## 2016-11-28 DIAGNOSIS — S42212D Unspecified displaced fracture of surgical neck of left humerus, subsequent encounter for fracture with routine healing: Secondary | ICD-10-CM | POA: Diagnosis not present

## 2016-11-29 DIAGNOSIS — D631 Anemia in chronic kidney disease: Secondary | ICD-10-CM | POA: Diagnosis not present

## 2016-11-29 DIAGNOSIS — N2581 Secondary hyperparathyroidism of renal origin: Secondary | ICD-10-CM | POA: Diagnosis not present

## 2016-11-29 DIAGNOSIS — N186 End stage renal disease: Secondary | ICD-10-CM | POA: Diagnosis not present

## 2016-11-29 DIAGNOSIS — D509 Iron deficiency anemia, unspecified: Secondary | ICD-10-CM | POA: Diagnosis not present

## 2016-11-29 DIAGNOSIS — Z992 Dependence on renal dialysis: Secondary | ICD-10-CM | POA: Diagnosis not present

## 2016-12-01 DIAGNOSIS — N186 End stage renal disease: Secondary | ICD-10-CM | POA: Diagnosis not present

## 2016-12-01 DIAGNOSIS — D509 Iron deficiency anemia, unspecified: Secondary | ICD-10-CM | POA: Diagnosis not present

## 2016-12-01 DIAGNOSIS — D631 Anemia in chronic kidney disease: Secondary | ICD-10-CM | POA: Diagnosis not present

## 2016-12-01 DIAGNOSIS — N2581 Secondary hyperparathyroidism of renal origin: Secondary | ICD-10-CM | POA: Diagnosis not present

## 2016-12-01 DIAGNOSIS — Z992 Dependence on renal dialysis: Secondary | ICD-10-CM | POA: Diagnosis not present

## 2016-12-04 DIAGNOSIS — D509 Iron deficiency anemia, unspecified: Secondary | ICD-10-CM | POA: Diagnosis not present

## 2016-12-04 DIAGNOSIS — N2581 Secondary hyperparathyroidism of renal origin: Secondary | ICD-10-CM | POA: Diagnosis not present

## 2016-12-04 DIAGNOSIS — D631 Anemia in chronic kidney disease: Secondary | ICD-10-CM | POA: Diagnosis not present

## 2016-12-04 DIAGNOSIS — N186 End stage renal disease: Secondary | ICD-10-CM | POA: Diagnosis not present

## 2016-12-04 DIAGNOSIS — Z992 Dependence on renal dialysis: Secondary | ICD-10-CM | POA: Diagnosis not present

## 2016-12-06 DIAGNOSIS — N2581 Secondary hyperparathyroidism of renal origin: Secondary | ICD-10-CM | POA: Diagnosis not present

## 2016-12-06 DIAGNOSIS — D631 Anemia in chronic kidney disease: Secondary | ICD-10-CM | POA: Diagnosis not present

## 2016-12-06 DIAGNOSIS — D509 Iron deficiency anemia, unspecified: Secondary | ICD-10-CM | POA: Diagnosis not present

## 2016-12-06 DIAGNOSIS — Z992 Dependence on renal dialysis: Secondary | ICD-10-CM | POA: Diagnosis not present

## 2016-12-06 DIAGNOSIS — N186 End stage renal disease: Secondary | ICD-10-CM | POA: Diagnosis not present

## 2016-12-08 DIAGNOSIS — D509 Iron deficiency anemia, unspecified: Secondary | ICD-10-CM | POA: Diagnosis not present

## 2016-12-08 DIAGNOSIS — N186 End stage renal disease: Secondary | ICD-10-CM | POA: Diagnosis not present

## 2016-12-08 DIAGNOSIS — N2581 Secondary hyperparathyroidism of renal origin: Secondary | ICD-10-CM | POA: Diagnosis not present

## 2016-12-08 DIAGNOSIS — D631 Anemia in chronic kidney disease: Secondary | ICD-10-CM | POA: Diagnosis not present

## 2016-12-08 DIAGNOSIS — Z992 Dependence on renal dialysis: Secondary | ICD-10-CM | POA: Diagnosis not present

## 2016-12-11 DIAGNOSIS — D631 Anemia in chronic kidney disease: Secondary | ICD-10-CM | POA: Diagnosis not present

## 2016-12-11 DIAGNOSIS — D509 Iron deficiency anemia, unspecified: Secondary | ICD-10-CM | POA: Diagnosis not present

## 2016-12-11 DIAGNOSIS — N186 End stage renal disease: Secondary | ICD-10-CM | POA: Diagnosis not present

## 2016-12-11 DIAGNOSIS — Z992 Dependence on renal dialysis: Secondary | ICD-10-CM | POA: Diagnosis not present

## 2016-12-11 DIAGNOSIS — N2581 Secondary hyperparathyroidism of renal origin: Secondary | ICD-10-CM | POA: Diagnosis not present

## 2016-12-13 DIAGNOSIS — Z992 Dependence on renal dialysis: Secondary | ICD-10-CM | POA: Diagnosis not present

## 2016-12-13 DIAGNOSIS — D509 Iron deficiency anemia, unspecified: Secondary | ICD-10-CM | POA: Diagnosis not present

## 2016-12-13 DIAGNOSIS — N186 End stage renal disease: Secondary | ICD-10-CM | POA: Diagnosis not present

## 2016-12-13 DIAGNOSIS — D631 Anemia in chronic kidney disease: Secondary | ICD-10-CM | POA: Diagnosis not present

## 2016-12-13 DIAGNOSIS — N2581 Secondary hyperparathyroidism of renal origin: Secondary | ICD-10-CM | POA: Diagnosis not present

## 2016-12-14 DIAGNOSIS — E119 Type 2 diabetes mellitus without complications: Secondary | ICD-10-CM | POA: Diagnosis not present

## 2016-12-14 DIAGNOSIS — N185 Chronic kidney disease, stage 5: Secondary | ICD-10-CM | POA: Diagnosis not present

## 2016-12-14 DIAGNOSIS — S42295D Other nondisplaced fracture of upper end of left humerus, subsequent encounter for fracture with routine healing: Secondary | ICD-10-CM | POA: Diagnosis not present

## 2016-12-14 DIAGNOSIS — I7389 Other specified peripheral vascular diseases: Secondary | ICD-10-CM | POA: Diagnosis not present

## 2016-12-15 DIAGNOSIS — N186 End stage renal disease: Secondary | ICD-10-CM | POA: Diagnosis not present

## 2016-12-15 DIAGNOSIS — N2581 Secondary hyperparathyroidism of renal origin: Secondary | ICD-10-CM | POA: Diagnosis not present

## 2016-12-15 DIAGNOSIS — D509 Iron deficiency anemia, unspecified: Secondary | ICD-10-CM | POA: Diagnosis not present

## 2016-12-15 DIAGNOSIS — Z992 Dependence on renal dialysis: Secondary | ICD-10-CM | POA: Diagnosis not present

## 2016-12-15 DIAGNOSIS — D631 Anemia in chronic kidney disease: Secondary | ICD-10-CM | POA: Diagnosis not present

## 2016-12-18 DIAGNOSIS — N2581 Secondary hyperparathyroidism of renal origin: Secondary | ICD-10-CM | POA: Diagnosis not present

## 2016-12-18 DIAGNOSIS — Z992 Dependence on renal dialysis: Secondary | ICD-10-CM | POA: Diagnosis not present

## 2016-12-18 DIAGNOSIS — D509 Iron deficiency anemia, unspecified: Secondary | ICD-10-CM | POA: Diagnosis not present

## 2016-12-18 DIAGNOSIS — D631 Anemia in chronic kidney disease: Secondary | ICD-10-CM | POA: Diagnosis not present

## 2016-12-18 DIAGNOSIS — N186 End stage renal disease: Secondary | ICD-10-CM | POA: Diagnosis not present

## 2016-12-19 DIAGNOSIS — W19XXXD Unspecified fall, subsequent encounter: Secondary | ICD-10-CM | POA: Diagnosis not present

## 2016-12-19 DIAGNOSIS — I739 Peripheral vascular disease, unspecified: Secondary | ICD-10-CM | POA: Diagnosis not present

## 2016-12-19 DIAGNOSIS — M5136 Other intervertebral disc degeneration, lumbar region: Secondary | ICD-10-CM | POA: Diagnosis not present

## 2016-12-19 DIAGNOSIS — S42295D Other nondisplaced fracture of upper end of left humerus, subsequent encounter for fracture with routine healing: Secondary | ICD-10-CM | POA: Diagnosis not present

## 2016-12-19 DIAGNOSIS — N186 End stage renal disease: Secondary | ICD-10-CM | POA: Diagnosis not present

## 2016-12-19 DIAGNOSIS — Z955 Presence of coronary angioplasty implant and graft: Secondary | ICD-10-CM | POA: Diagnosis not present

## 2016-12-19 DIAGNOSIS — Z7982 Long term (current) use of aspirin: Secondary | ICD-10-CM | POA: Diagnosis not present

## 2016-12-19 DIAGNOSIS — E1151 Type 2 diabetes mellitus with diabetic peripheral angiopathy without gangrene: Secondary | ICD-10-CM | POA: Diagnosis not present

## 2016-12-19 DIAGNOSIS — M1991 Primary osteoarthritis, unspecified site: Secondary | ICD-10-CM | POA: Diagnosis not present

## 2016-12-19 DIAGNOSIS — S42302D Unspecified fracture of shaft of humerus, left arm, subsequent encounter for fracture with routine healing: Secondary | ICD-10-CM | POA: Diagnosis not present

## 2016-12-19 DIAGNOSIS — K219 Gastro-esophageal reflux disease without esophagitis: Secondary | ICD-10-CM | POA: Diagnosis not present

## 2016-12-19 DIAGNOSIS — Z992 Dependence on renal dialysis: Secondary | ICD-10-CM | POA: Diagnosis not present

## 2016-12-19 DIAGNOSIS — E1122 Type 2 diabetes mellitus with diabetic chronic kidney disease: Secondary | ICD-10-CM | POA: Diagnosis not present

## 2016-12-19 DIAGNOSIS — I2510 Atherosclerotic heart disease of native coronary artery without angina pectoris: Secondary | ICD-10-CM | POA: Diagnosis not present

## 2016-12-20 DIAGNOSIS — Z992 Dependence on renal dialysis: Secondary | ICD-10-CM | POA: Diagnosis not present

## 2016-12-20 DIAGNOSIS — D631 Anemia in chronic kidney disease: Secondary | ICD-10-CM | POA: Diagnosis not present

## 2016-12-20 DIAGNOSIS — N186 End stage renal disease: Secondary | ICD-10-CM | POA: Diagnosis not present

## 2016-12-20 DIAGNOSIS — D509 Iron deficiency anemia, unspecified: Secondary | ICD-10-CM | POA: Diagnosis not present

## 2016-12-20 DIAGNOSIS — N2581 Secondary hyperparathyroidism of renal origin: Secondary | ICD-10-CM | POA: Diagnosis not present

## 2016-12-21 DIAGNOSIS — S42302D Unspecified fracture of shaft of humerus, left arm, subsequent encounter for fracture with routine healing: Secondary | ICD-10-CM | POA: Diagnosis not present

## 2016-12-21 DIAGNOSIS — E1122 Type 2 diabetes mellitus with diabetic chronic kidney disease: Secondary | ICD-10-CM | POA: Diagnosis not present

## 2016-12-21 DIAGNOSIS — E1151 Type 2 diabetes mellitus with diabetic peripheral angiopathy without gangrene: Secondary | ICD-10-CM | POA: Diagnosis not present

## 2016-12-21 DIAGNOSIS — I2510 Atherosclerotic heart disease of native coronary artery without angina pectoris: Secondary | ICD-10-CM | POA: Diagnosis not present

## 2016-12-21 DIAGNOSIS — M5136 Other intervertebral disc degeneration, lumbar region: Secondary | ICD-10-CM | POA: Diagnosis not present

## 2016-12-21 DIAGNOSIS — N186 End stage renal disease: Secondary | ICD-10-CM | POA: Diagnosis not present

## 2016-12-22 DIAGNOSIS — N2581 Secondary hyperparathyroidism of renal origin: Secondary | ICD-10-CM | POA: Diagnosis not present

## 2016-12-22 DIAGNOSIS — Z992 Dependence on renal dialysis: Secondary | ICD-10-CM | POA: Diagnosis not present

## 2016-12-22 DIAGNOSIS — D509 Iron deficiency anemia, unspecified: Secondary | ICD-10-CM | POA: Diagnosis not present

## 2016-12-22 DIAGNOSIS — N186 End stage renal disease: Secondary | ICD-10-CM | POA: Diagnosis not present

## 2016-12-22 DIAGNOSIS — D631 Anemia in chronic kidney disease: Secondary | ICD-10-CM | POA: Diagnosis not present

## 2016-12-25 DIAGNOSIS — Z992 Dependence on renal dialysis: Secondary | ICD-10-CM | POA: Diagnosis not present

## 2016-12-25 DIAGNOSIS — E8770 Fluid overload, unspecified: Secondary | ICD-10-CM | POA: Diagnosis not present

## 2016-12-25 DIAGNOSIS — N186 End stage renal disease: Secondary | ICD-10-CM | POA: Diagnosis not present

## 2016-12-25 DIAGNOSIS — D509 Iron deficiency anemia, unspecified: Secondary | ICD-10-CM | POA: Diagnosis not present

## 2016-12-25 DIAGNOSIS — D631 Anemia in chronic kidney disease: Secondary | ICD-10-CM | POA: Diagnosis not present

## 2016-12-25 DIAGNOSIS — N2581 Secondary hyperparathyroidism of renal origin: Secondary | ICD-10-CM | POA: Diagnosis not present

## 2016-12-26 DIAGNOSIS — S42302D Unspecified fracture of shaft of humerus, left arm, subsequent encounter for fracture with routine healing: Secondary | ICD-10-CM | POA: Diagnosis not present

## 2016-12-26 DIAGNOSIS — E1122 Type 2 diabetes mellitus with diabetic chronic kidney disease: Secondary | ICD-10-CM | POA: Diagnosis not present

## 2016-12-26 DIAGNOSIS — N186 End stage renal disease: Secondary | ICD-10-CM | POA: Diagnosis not present

## 2016-12-26 DIAGNOSIS — E1151 Type 2 diabetes mellitus with diabetic peripheral angiopathy without gangrene: Secondary | ICD-10-CM | POA: Diagnosis not present

## 2016-12-26 DIAGNOSIS — M5136 Other intervertebral disc degeneration, lumbar region: Secondary | ICD-10-CM | POA: Diagnosis not present

## 2016-12-26 DIAGNOSIS — I2510 Atherosclerotic heart disease of native coronary artery without angina pectoris: Secondary | ICD-10-CM | POA: Diagnosis not present

## 2016-12-27 DIAGNOSIS — N2581 Secondary hyperparathyroidism of renal origin: Secondary | ICD-10-CM | POA: Diagnosis not present

## 2016-12-27 DIAGNOSIS — D631 Anemia in chronic kidney disease: Secondary | ICD-10-CM | POA: Diagnosis not present

## 2016-12-27 DIAGNOSIS — N186 End stage renal disease: Secondary | ICD-10-CM | POA: Diagnosis not present

## 2016-12-27 DIAGNOSIS — E8770 Fluid overload, unspecified: Secondary | ICD-10-CM | POA: Diagnosis not present

## 2016-12-27 DIAGNOSIS — D509 Iron deficiency anemia, unspecified: Secondary | ICD-10-CM | POA: Diagnosis not present

## 2016-12-27 DIAGNOSIS — Z992 Dependence on renal dialysis: Secondary | ICD-10-CM | POA: Diagnosis not present

## 2016-12-28 DIAGNOSIS — M5136 Other intervertebral disc degeneration, lumbar region: Secondary | ICD-10-CM | POA: Diagnosis not present

## 2016-12-28 DIAGNOSIS — S42302D Unspecified fracture of shaft of humerus, left arm, subsequent encounter for fracture with routine healing: Secondary | ICD-10-CM | POA: Diagnosis not present

## 2016-12-28 DIAGNOSIS — E1122 Type 2 diabetes mellitus with diabetic chronic kidney disease: Secondary | ICD-10-CM | POA: Diagnosis not present

## 2016-12-28 DIAGNOSIS — N186 End stage renal disease: Secondary | ICD-10-CM | POA: Diagnosis not present

## 2016-12-28 DIAGNOSIS — E1151 Type 2 diabetes mellitus with diabetic peripheral angiopathy without gangrene: Secondary | ICD-10-CM | POA: Diagnosis not present

## 2016-12-28 DIAGNOSIS — I2510 Atherosclerotic heart disease of native coronary artery without angina pectoris: Secondary | ICD-10-CM | POA: Diagnosis not present

## 2016-12-29 DIAGNOSIS — E8770 Fluid overload, unspecified: Secondary | ICD-10-CM | POA: Diagnosis not present

## 2016-12-29 DIAGNOSIS — N2581 Secondary hyperparathyroidism of renal origin: Secondary | ICD-10-CM | POA: Diagnosis not present

## 2016-12-29 DIAGNOSIS — D631 Anemia in chronic kidney disease: Secondary | ICD-10-CM | POA: Diagnosis not present

## 2016-12-29 DIAGNOSIS — Z992 Dependence on renal dialysis: Secondary | ICD-10-CM | POA: Diagnosis not present

## 2016-12-29 DIAGNOSIS — D509 Iron deficiency anemia, unspecified: Secondary | ICD-10-CM | POA: Diagnosis not present

## 2016-12-29 DIAGNOSIS — N186 End stage renal disease: Secondary | ICD-10-CM | POA: Diagnosis not present

## 2016-12-30 DIAGNOSIS — N186 End stage renal disease: Secondary | ICD-10-CM | POA: Diagnosis not present

## 2016-12-30 DIAGNOSIS — D509 Iron deficiency anemia, unspecified: Secondary | ICD-10-CM | POA: Diagnosis not present

## 2016-12-30 DIAGNOSIS — E8770 Fluid overload, unspecified: Secondary | ICD-10-CM | POA: Diagnosis not present

## 2016-12-30 DIAGNOSIS — D631 Anemia in chronic kidney disease: Secondary | ICD-10-CM | POA: Diagnosis not present

## 2016-12-30 DIAGNOSIS — Z992 Dependence on renal dialysis: Secondary | ICD-10-CM | POA: Diagnosis not present

## 2016-12-30 DIAGNOSIS — N2581 Secondary hyperparathyroidism of renal origin: Secondary | ICD-10-CM | POA: Diagnosis not present

## 2017-01-02 DIAGNOSIS — N186 End stage renal disease: Secondary | ICD-10-CM | POA: Diagnosis not present

## 2017-01-02 DIAGNOSIS — D509 Iron deficiency anemia, unspecified: Secondary | ICD-10-CM | POA: Diagnosis not present

## 2017-01-02 DIAGNOSIS — Z992 Dependence on renal dialysis: Secondary | ICD-10-CM | POA: Diagnosis not present

## 2017-01-02 DIAGNOSIS — N2581 Secondary hyperparathyroidism of renal origin: Secondary | ICD-10-CM | POA: Diagnosis not present

## 2017-01-02 DIAGNOSIS — D631 Anemia in chronic kidney disease: Secondary | ICD-10-CM | POA: Diagnosis not present

## 2017-01-02 DIAGNOSIS — E8770 Fluid overload, unspecified: Secondary | ICD-10-CM | POA: Diagnosis not present

## 2017-01-03 DIAGNOSIS — N186 End stage renal disease: Secondary | ICD-10-CM | POA: Diagnosis not present

## 2017-01-03 DIAGNOSIS — D509 Iron deficiency anemia, unspecified: Secondary | ICD-10-CM | POA: Diagnosis not present

## 2017-01-03 DIAGNOSIS — D631 Anemia in chronic kidney disease: Secondary | ICD-10-CM | POA: Diagnosis not present

## 2017-01-03 DIAGNOSIS — N2581 Secondary hyperparathyroidism of renal origin: Secondary | ICD-10-CM | POA: Diagnosis not present

## 2017-01-03 DIAGNOSIS — E8770 Fluid overload, unspecified: Secondary | ICD-10-CM | POA: Diagnosis not present

## 2017-01-03 DIAGNOSIS — Z992 Dependence on renal dialysis: Secondary | ICD-10-CM | POA: Diagnosis not present

## 2017-01-04 DIAGNOSIS — E1151 Type 2 diabetes mellitus with diabetic peripheral angiopathy without gangrene: Secondary | ICD-10-CM | POA: Diagnosis not present

## 2017-01-04 DIAGNOSIS — E1122 Type 2 diabetes mellitus with diabetic chronic kidney disease: Secondary | ICD-10-CM | POA: Diagnosis not present

## 2017-01-04 DIAGNOSIS — S42302D Unspecified fracture of shaft of humerus, left arm, subsequent encounter for fracture with routine healing: Secondary | ICD-10-CM | POA: Diagnosis not present

## 2017-01-04 DIAGNOSIS — I2510 Atherosclerotic heart disease of native coronary artery without angina pectoris: Secondary | ICD-10-CM | POA: Diagnosis not present

## 2017-01-04 DIAGNOSIS — N186 End stage renal disease: Secondary | ICD-10-CM | POA: Diagnosis not present

## 2017-01-04 DIAGNOSIS — M5136 Other intervertebral disc degeneration, lumbar region: Secondary | ICD-10-CM | POA: Diagnosis not present

## 2017-01-05 DIAGNOSIS — E8770 Fluid overload, unspecified: Secondary | ICD-10-CM | POA: Diagnosis not present

## 2017-01-05 DIAGNOSIS — S42302D Unspecified fracture of shaft of humerus, left arm, subsequent encounter for fracture with routine healing: Secondary | ICD-10-CM | POA: Diagnosis not present

## 2017-01-05 DIAGNOSIS — E1151 Type 2 diabetes mellitus with diabetic peripheral angiopathy without gangrene: Secondary | ICD-10-CM | POA: Diagnosis not present

## 2017-01-05 DIAGNOSIS — D509 Iron deficiency anemia, unspecified: Secondary | ICD-10-CM | POA: Diagnosis not present

## 2017-01-05 DIAGNOSIS — I2510 Atherosclerotic heart disease of native coronary artery without angina pectoris: Secondary | ICD-10-CM | POA: Diagnosis not present

## 2017-01-05 DIAGNOSIS — Z992 Dependence on renal dialysis: Secondary | ICD-10-CM | POA: Diagnosis not present

## 2017-01-05 DIAGNOSIS — E1122 Type 2 diabetes mellitus with diabetic chronic kidney disease: Secondary | ICD-10-CM | POA: Diagnosis not present

## 2017-01-05 DIAGNOSIS — M5136 Other intervertebral disc degeneration, lumbar region: Secondary | ICD-10-CM | POA: Diagnosis not present

## 2017-01-05 DIAGNOSIS — N2581 Secondary hyperparathyroidism of renal origin: Secondary | ICD-10-CM | POA: Diagnosis not present

## 2017-01-05 DIAGNOSIS — D631 Anemia in chronic kidney disease: Secondary | ICD-10-CM | POA: Diagnosis not present

## 2017-01-05 DIAGNOSIS — N186 End stage renal disease: Secondary | ICD-10-CM | POA: Diagnosis not present

## 2017-01-08 DIAGNOSIS — E119 Type 2 diabetes mellitus without complications: Secondary | ICD-10-CM | POA: Diagnosis not present

## 2017-01-08 DIAGNOSIS — D509 Iron deficiency anemia, unspecified: Secondary | ICD-10-CM | POA: Diagnosis not present

## 2017-01-08 DIAGNOSIS — D631 Anemia in chronic kidney disease: Secondary | ICD-10-CM | POA: Diagnosis not present

## 2017-01-08 DIAGNOSIS — N2581 Secondary hyperparathyroidism of renal origin: Secondary | ICD-10-CM | POA: Diagnosis not present

## 2017-01-08 DIAGNOSIS — Z794 Long term (current) use of insulin: Secondary | ICD-10-CM | POA: Diagnosis not present

## 2017-01-08 DIAGNOSIS — Z992 Dependence on renal dialysis: Secondary | ICD-10-CM | POA: Diagnosis not present

## 2017-01-08 DIAGNOSIS — N186 End stage renal disease: Secondary | ICD-10-CM | POA: Diagnosis not present

## 2017-01-08 DIAGNOSIS — E8770 Fluid overload, unspecified: Secondary | ICD-10-CM | POA: Diagnosis not present

## 2017-01-10 DIAGNOSIS — Z992 Dependence on renal dialysis: Secondary | ICD-10-CM | POA: Diagnosis not present

## 2017-01-10 DIAGNOSIS — N2581 Secondary hyperparathyroidism of renal origin: Secondary | ICD-10-CM | POA: Diagnosis not present

## 2017-01-10 DIAGNOSIS — N186 End stage renal disease: Secondary | ICD-10-CM | POA: Diagnosis not present

## 2017-01-10 DIAGNOSIS — D631 Anemia in chronic kidney disease: Secondary | ICD-10-CM | POA: Diagnosis not present

## 2017-01-10 DIAGNOSIS — D509 Iron deficiency anemia, unspecified: Secondary | ICD-10-CM | POA: Diagnosis not present

## 2017-01-10 DIAGNOSIS — E8770 Fluid overload, unspecified: Secondary | ICD-10-CM | POA: Diagnosis not present

## 2017-01-12 DIAGNOSIS — N2581 Secondary hyperparathyroidism of renal origin: Secondary | ICD-10-CM | POA: Diagnosis not present

## 2017-01-12 DIAGNOSIS — E8770 Fluid overload, unspecified: Secondary | ICD-10-CM | POA: Diagnosis not present

## 2017-01-12 DIAGNOSIS — N186 End stage renal disease: Secondary | ICD-10-CM | POA: Diagnosis not present

## 2017-01-12 DIAGNOSIS — D631 Anemia in chronic kidney disease: Secondary | ICD-10-CM | POA: Diagnosis not present

## 2017-01-12 DIAGNOSIS — D509 Iron deficiency anemia, unspecified: Secondary | ICD-10-CM | POA: Diagnosis not present

## 2017-01-12 DIAGNOSIS — Z992 Dependence on renal dialysis: Secondary | ICD-10-CM | POA: Diagnosis not present

## 2017-01-15 DIAGNOSIS — N2581 Secondary hyperparathyroidism of renal origin: Secondary | ICD-10-CM | POA: Diagnosis not present

## 2017-01-15 DIAGNOSIS — N186 End stage renal disease: Secondary | ICD-10-CM | POA: Diagnosis not present

## 2017-01-15 DIAGNOSIS — E8770 Fluid overload, unspecified: Secondary | ICD-10-CM | POA: Diagnosis not present

## 2017-01-15 DIAGNOSIS — D631 Anemia in chronic kidney disease: Secondary | ICD-10-CM | POA: Diagnosis not present

## 2017-01-15 DIAGNOSIS — D509 Iron deficiency anemia, unspecified: Secondary | ICD-10-CM | POA: Diagnosis not present

## 2017-01-15 DIAGNOSIS — Z992 Dependence on renal dialysis: Secondary | ICD-10-CM | POA: Diagnosis not present

## 2017-01-17 DIAGNOSIS — N186 End stage renal disease: Secondary | ICD-10-CM | POA: Diagnosis not present

## 2017-01-17 DIAGNOSIS — Z992 Dependence on renal dialysis: Secondary | ICD-10-CM | POA: Diagnosis not present

## 2017-01-17 DIAGNOSIS — D631 Anemia in chronic kidney disease: Secondary | ICD-10-CM | POA: Diagnosis not present

## 2017-01-17 DIAGNOSIS — D509 Iron deficiency anemia, unspecified: Secondary | ICD-10-CM | POA: Diagnosis not present

## 2017-01-17 DIAGNOSIS — N2581 Secondary hyperparathyroidism of renal origin: Secondary | ICD-10-CM | POA: Diagnosis not present

## 2017-01-17 DIAGNOSIS — E8770 Fluid overload, unspecified: Secondary | ICD-10-CM | POA: Diagnosis not present

## 2017-01-19 DIAGNOSIS — E8770 Fluid overload, unspecified: Secondary | ICD-10-CM | POA: Diagnosis not present

## 2017-01-19 DIAGNOSIS — D509 Iron deficiency anemia, unspecified: Secondary | ICD-10-CM | POA: Diagnosis not present

## 2017-01-19 DIAGNOSIS — N186 End stage renal disease: Secondary | ICD-10-CM | POA: Diagnosis not present

## 2017-01-19 DIAGNOSIS — D631 Anemia in chronic kidney disease: Secondary | ICD-10-CM | POA: Diagnosis not present

## 2017-01-19 DIAGNOSIS — Z992 Dependence on renal dialysis: Secondary | ICD-10-CM | POA: Diagnosis not present

## 2017-01-19 DIAGNOSIS — N2581 Secondary hyperparathyroidism of renal origin: Secondary | ICD-10-CM | POA: Diagnosis not present

## 2017-01-22 DIAGNOSIS — D631 Anemia in chronic kidney disease: Secondary | ICD-10-CM | POA: Diagnosis not present

## 2017-01-22 DIAGNOSIS — N2581 Secondary hyperparathyroidism of renal origin: Secondary | ICD-10-CM | POA: Diagnosis not present

## 2017-01-22 DIAGNOSIS — E8770 Fluid overload, unspecified: Secondary | ICD-10-CM | POA: Diagnosis not present

## 2017-01-22 DIAGNOSIS — Z992 Dependence on renal dialysis: Secondary | ICD-10-CM | POA: Diagnosis not present

## 2017-01-22 DIAGNOSIS — D509 Iron deficiency anemia, unspecified: Secondary | ICD-10-CM | POA: Diagnosis not present

## 2017-01-22 DIAGNOSIS — N186 End stage renal disease: Secondary | ICD-10-CM | POA: Diagnosis not present

## 2017-01-24 DIAGNOSIS — Z992 Dependence on renal dialysis: Secondary | ICD-10-CM | POA: Diagnosis not present

## 2017-01-24 DIAGNOSIS — N186 End stage renal disease: Secondary | ICD-10-CM | POA: Diagnosis not present

## 2017-01-24 DIAGNOSIS — N2581 Secondary hyperparathyroidism of renal origin: Secondary | ICD-10-CM | POA: Diagnosis not present

## 2017-01-24 DIAGNOSIS — D631 Anemia in chronic kidney disease: Secondary | ICD-10-CM | POA: Diagnosis not present

## 2017-01-24 DIAGNOSIS — D509 Iron deficiency anemia, unspecified: Secondary | ICD-10-CM | POA: Diagnosis not present

## 2017-01-26 DIAGNOSIS — D509 Iron deficiency anemia, unspecified: Secondary | ICD-10-CM | POA: Diagnosis not present

## 2017-01-26 DIAGNOSIS — N2581 Secondary hyperparathyroidism of renal origin: Secondary | ICD-10-CM | POA: Diagnosis not present

## 2017-01-26 DIAGNOSIS — D631 Anemia in chronic kidney disease: Secondary | ICD-10-CM | POA: Diagnosis not present

## 2017-01-26 DIAGNOSIS — Z992 Dependence on renal dialysis: Secondary | ICD-10-CM | POA: Diagnosis not present

## 2017-01-26 DIAGNOSIS — N186 End stage renal disease: Secondary | ICD-10-CM | POA: Diagnosis not present

## 2017-01-29 DIAGNOSIS — Z992 Dependence on renal dialysis: Secondary | ICD-10-CM | POA: Diagnosis not present

## 2017-01-29 DIAGNOSIS — D509 Iron deficiency anemia, unspecified: Secondary | ICD-10-CM | POA: Diagnosis not present

## 2017-01-29 DIAGNOSIS — N2581 Secondary hyperparathyroidism of renal origin: Secondary | ICD-10-CM | POA: Diagnosis not present

## 2017-01-29 DIAGNOSIS — N186 End stage renal disease: Secondary | ICD-10-CM | POA: Diagnosis not present

## 2017-01-29 DIAGNOSIS — D631 Anemia in chronic kidney disease: Secondary | ICD-10-CM | POA: Diagnosis not present

## 2017-01-31 DIAGNOSIS — J9811 Atelectasis: Secondary | ICD-10-CM | POA: Diagnosis not present

## 2017-01-31 DIAGNOSIS — D631 Anemia in chronic kidney disease: Secondary | ICD-10-CM | POA: Diagnosis not present

## 2017-01-31 DIAGNOSIS — R531 Weakness: Secondary | ICD-10-CM | POA: Diagnosis not present

## 2017-01-31 DIAGNOSIS — J449 Chronic obstructive pulmonary disease, unspecified: Secondary | ICD-10-CM | POA: Diagnosis not present

## 2017-01-31 DIAGNOSIS — E1129 Type 2 diabetes mellitus with other diabetic kidney complication: Secondary | ICD-10-CM | POA: Diagnosis not present

## 2017-01-31 DIAGNOSIS — R404 Transient alteration of awareness: Secondary | ICD-10-CM | POA: Diagnosis not present

## 2017-01-31 DIAGNOSIS — I480 Paroxysmal atrial fibrillation: Secondary | ICD-10-CM | POA: Diagnosis not present

## 2017-01-31 DIAGNOSIS — I120 Hypertensive chronic kidney disease with stage 5 chronic kidney disease or end stage renal disease: Secondary | ICD-10-CM | POA: Diagnosis not present

## 2017-01-31 DIAGNOSIS — E1122 Type 2 diabetes mellitus with diabetic chronic kidney disease: Secondary | ICD-10-CM | POA: Diagnosis not present

## 2017-01-31 DIAGNOSIS — R001 Bradycardia, unspecified: Secondary | ICD-10-CM | POA: Diagnosis not present

## 2017-01-31 DIAGNOSIS — N186 End stage renal disease: Secondary | ICD-10-CM | POA: Diagnosis not present

## 2017-01-31 DIAGNOSIS — Z992 Dependence on renal dialysis: Secondary | ICD-10-CM | POA: Diagnosis not present

## 2017-01-31 DIAGNOSIS — I4891 Unspecified atrial fibrillation: Secondary | ICD-10-CM | POA: Diagnosis not present

## 2017-01-31 DIAGNOSIS — R42 Dizziness and giddiness: Secondary | ICD-10-CM | POA: Diagnosis not present

## 2017-02-01 DIAGNOSIS — Z992 Dependence on renal dialysis: Secondary | ICD-10-CM | POA: Diagnosis not present

## 2017-02-01 DIAGNOSIS — D649 Anemia, unspecified: Secondary | ICD-10-CM | POA: Diagnosis present

## 2017-02-01 DIAGNOSIS — D631 Anemia in chronic kidney disease: Secondary | ICD-10-CM | POA: Diagnosis not present

## 2017-02-01 DIAGNOSIS — M47896 Other spondylosis, lumbar region: Secondary | ICD-10-CM | POA: Diagnosis present

## 2017-02-01 DIAGNOSIS — Z955 Presence of coronary angioplasty implant and graft: Secondary | ICD-10-CM | POA: Diagnosis not present

## 2017-02-01 DIAGNOSIS — Z7982 Long term (current) use of aspirin: Secondary | ICD-10-CM | POA: Diagnosis not present

## 2017-02-01 DIAGNOSIS — Z8249 Family history of ischemic heart disease and other diseases of the circulatory system: Secondary | ICD-10-CM | POA: Diagnosis not present

## 2017-02-01 DIAGNOSIS — Z794 Long term (current) use of insulin: Secondary | ICD-10-CM | POA: Diagnosis not present

## 2017-02-01 DIAGNOSIS — Z833 Family history of diabetes mellitus: Secondary | ICD-10-CM | POA: Diagnosis not present

## 2017-02-01 DIAGNOSIS — J449 Chronic obstructive pulmonary disease, unspecified: Secondary | ICD-10-CM | POA: Diagnosis not present

## 2017-02-01 DIAGNOSIS — E1122 Type 2 diabetes mellitus with diabetic chronic kidney disease: Secondary | ICD-10-CM | POA: Diagnosis not present

## 2017-02-01 DIAGNOSIS — I2510 Atherosclerotic heart disease of native coronary artery without angina pectoris: Secondary | ICD-10-CM | POA: Diagnosis present

## 2017-02-01 DIAGNOSIS — M898X9 Other specified disorders of bone, unspecified site: Secondary | ICD-10-CM | POA: Diagnosis present

## 2017-02-01 DIAGNOSIS — Z87891 Personal history of nicotine dependence: Secondary | ICD-10-CM | POA: Diagnosis not present

## 2017-02-01 DIAGNOSIS — E1151 Type 2 diabetes mellitus with diabetic peripheral angiopathy without gangrene: Secondary | ICD-10-CM | POA: Diagnosis present

## 2017-02-01 DIAGNOSIS — R531 Weakness: Secondary | ICD-10-CM | POA: Diagnosis present

## 2017-02-01 DIAGNOSIS — Z880 Allergy status to penicillin: Secondary | ICD-10-CM | POA: Diagnosis not present

## 2017-02-01 DIAGNOSIS — Z8673 Personal history of transient ischemic attack (TIA), and cerebral infarction without residual deficits: Secondary | ICD-10-CM | POA: Diagnosis not present

## 2017-02-01 DIAGNOSIS — Z79899 Other long term (current) drug therapy: Secondary | ICD-10-CM | POA: Diagnosis not present

## 2017-02-01 DIAGNOSIS — N186 End stage renal disease: Secondary | ICD-10-CM | POA: Diagnosis not present

## 2017-02-01 DIAGNOSIS — I120 Hypertensive chronic kidney disease with stage 5 chronic kidney disease or end stage renal disease: Secondary | ICD-10-CM | POA: Diagnosis not present

## 2017-02-01 DIAGNOSIS — I4891 Unspecified atrial fibrillation: Secondary | ICD-10-CM | POA: Diagnosis present

## 2017-02-01 DIAGNOSIS — R001 Bradycardia, unspecified: Secondary | ICD-10-CM | POA: Diagnosis not present

## 2017-02-01 DIAGNOSIS — E1129 Type 2 diabetes mellitus with other diabetic kidney complication: Secondary | ICD-10-CM | POA: Diagnosis not present

## 2017-02-01 DIAGNOSIS — I480 Paroxysmal atrial fibrillation: Secondary | ICD-10-CM | POA: Diagnosis not present

## 2017-02-05 DIAGNOSIS — Z992 Dependence on renal dialysis: Secondary | ICD-10-CM | POA: Diagnosis not present

## 2017-02-05 DIAGNOSIS — D509 Iron deficiency anemia, unspecified: Secondary | ICD-10-CM | POA: Diagnosis not present

## 2017-02-05 DIAGNOSIS — N2581 Secondary hyperparathyroidism of renal origin: Secondary | ICD-10-CM | POA: Diagnosis not present

## 2017-02-05 DIAGNOSIS — D631 Anemia in chronic kidney disease: Secondary | ICD-10-CM | POA: Diagnosis not present

## 2017-02-05 DIAGNOSIS — N186 End stage renal disease: Secondary | ICD-10-CM | POA: Diagnosis not present

## 2017-02-06 DIAGNOSIS — Z9181 History of falling: Secondary | ICD-10-CM | POA: Diagnosis not present

## 2017-02-06 DIAGNOSIS — E1151 Type 2 diabetes mellitus with diabetic peripheral angiopathy without gangrene: Secondary | ICD-10-CM | POA: Diagnosis not present

## 2017-02-06 DIAGNOSIS — J449 Chronic obstructive pulmonary disease, unspecified: Secondary | ICD-10-CM | POA: Diagnosis not present

## 2017-02-06 DIAGNOSIS — J441 Chronic obstructive pulmonary disease with (acute) exacerbation: Secondary | ICD-10-CM | POA: Diagnosis not present

## 2017-02-06 DIAGNOSIS — Z8673 Personal history of transient ischemic attack (TIA), and cerebral infarction without residual deficits: Secondary | ICD-10-CM | POA: Diagnosis not present

## 2017-02-06 DIAGNOSIS — Z992 Dependence on renal dialysis: Secondary | ICD-10-CM | POA: Diagnosis not present

## 2017-02-06 DIAGNOSIS — M479 Spondylosis, unspecified: Secondary | ICD-10-CM | POA: Diagnosis not present

## 2017-02-06 DIAGNOSIS — I4891 Unspecified atrial fibrillation: Secondary | ICD-10-CM | POA: Diagnosis not present

## 2017-02-06 DIAGNOSIS — I120 Hypertensive chronic kidney disease with stage 5 chronic kidney disease or end stage renal disease: Secondary | ICD-10-CM | POA: Diagnosis not present

## 2017-02-06 DIAGNOSIS — E1122 Type 2 diabetes mellitus with diabetic chronic kidney disease: Secondary | ICD-10-CM | POA: Diagnosis not present

## 2017-02-06 DIAGNOSIS — I481 Persistent atrial fibrillation: Secondary | ICD-10-CM | POA: Diagnosis not present

## 2017-02-06 DIAGNOSIS — Z7982 Long term (current) use of aspirin: Secondary | ICD-10-CM | POA: Diagnosis not present

## 2017-02-06 DIAGNOSIS — I2510 Atherosclerotic heart disease of native coronary artery without angina pectoris: Secondary | ICD-10-CM | POA: Diagnosis not present

## 2017-02-06 DIAGNOSIS — L89151 Pressure ulcer of sacral region, stage 1: Secondary | ICD-10-CM | POA: Diagnosis not present

## 2017-02-06 DIAGNOSIS — N186 End stage renal disease: Secondary | ICD-10-CM | POA: Diagnosis not present

## 2017-02-07 DIAGNOSIS — Z992 Dependence on renal dialysis: Secondary | ICD-10-CM | POA: Diagnosis not present

## 2017-02-07 DIAGNOSIS — D509 Iron deficiency anemia, unspecified: Secondary | ICD-10-CM | POA: Diagnosis not present

## 2017-02-07 DIAGNOSIS — N186 End stage renal disease: Secondary | ICD-10-CM | POA: Diagnosis not present

## 2017-02-07 DIAGNOSIS — D631 Anemia in chronic kidney disease: Secondary | ICD-10-CM | POA: Diagnosis not present

## 2017-02-07 DIAGNOSIS — N2581 Secondary hyperparathyroidism of renal origin: Secondary | ICD-10-CM | POA: Diagnosis not present

## 2017-02-08 DIAGNOSIS — L89151 Pressure ulcer of sacral region, stage 1: Secondary | ICD-10-CM | POA: Diagnosis not present

## 2017-02-08 DIAGNOSIS — I120 Hypertensive chronic kidney disease with stage 5 chronic kidney disease or end stage renal disease: Secondary | ICD-10-CM | POA: Diagnosis not present

## 2017-02-08 DIAGNOSIS — I4891 Unspecified atrial fibrillation: Secondary | ICD-10-CM | POA: Diagnosis not present

## 2017-02-08 DIAGNOSIS — E1122 Type 2 diabetes mellitus with diabetic chronic kidney disease: Secondary | ICD-10-CM | POA: Diagnosis not present

## 2017-02-08 DIAGNOSIS — J449 Chronic obstructive pulmonary disease, unspecified: Secondary | ICD-10-CM | POA: Diagnosis not present

## 2017-02-08 DIAGNOSIS — N186 End stage renal disease: Secondary | ICD-10-CM | POA: Diagnosis not present

## 2017-02-09 DIAGNOSIS — D631 Anemia in chronic kidney disease: Secondary | ICD-10-CM | POA: Diagnosis not present

## 2017-02-09 DIAGNOSIS — N186 End stage renal disease: Secondary | ICD-10-CM | POA: Diagnosis not present

## 2017-02-09 DIAGNOSIS — Z992 Dependence on renal dialysis: Secondary | ICD-10-CM | POA: Diagnosis not present

## 2017-02-09 DIAGNOSIS — N2581 Secondary hyperparathyroidism of renal origin: Secondary | ICD-10-CM | POA: Diagnosis not present

## 2017-02-09 DIAGNOSIS — D509 Iron deficiency anemia, unspecified: Secondary | ICD-10-CM | POA: Diagnosis not present

## 2017-02-12 DIAGNOSIS — Z992 Dependence on renal dialysis: Secondary | ICD-10-CM | POA: Diagnosis not present

## 2017-02-12 DIAGNOSIS — D631 Anemia in chronic kidney disease: Secondary | ICD-10-CM | POA: Diagnosis not present

## 2017-02-12 DIAGNOSIS — D509 Iron deficiency anemia, unspecified: Secondary | ICD-10-CM | POA: Diagnosis not present

## 2017-02-12 DIAGNOSIS — N2581 Secondary hyperparathyroidism of renal origin: Secondary | ICD-10-CM | POA: Diagnosis not present

## 2017-02-12 DIAGNOSIS — N186 End stage renal disease: Secondary | ICD-10-CM | POA: Diagnosis not present

## 2017-02-13 DIAGNOSIS — J449 Chronic obstructive pulmonary disease, unspecified: Secondary | ICD-10-CM | POA: Diagnosis not present

## 2017-02-13 DIAGNOSIS — M79676 Pain in unspecified toe(s): Secondary | ICD-10-CM | POA: Diagnosis not present

## 2017-02-13 DIAGNOSIS — L89151 Pressure ulcer of sacral region, stage 1: Secondary | ICD-10-CM | POA: Diagnosis not present

## 2017-02-13 DIAGNOSIS — L84 Corns and callosities: Secondary | ICD-10-CM | POA: Diagnosis not present

## 2017-02-13 DIAGNOSIS — I4891 Unspecified atrial fibrillation: Secondary | ICD-10-CM | POA: Diagnosis not present

## 2017-02-13 DIAGNOSIS — E1122 Type 2 diabetes mellitus with diabetic chronic kidney disease: Secondary | ICD-10-CM | POA: Diagnosis not present

## 2017-02-13 DIAGNOSIS — B351 Tinea unguium: Secondary | ICD-10-CM | POA: Diagnosis not present

## 2017-02-13 DIAGNOSIS — N186 End stage renal disease: Secondary | ICD-10-CM | POA: Diagnosis not present

## 2017-02-13 DIAGNOSIS — E1151 Type 2 diabetes mellitus with diabetic peripheral angiopathy without gangrene: Secondary | ICD-10-CM | POA: Diagnosis not present

## 2017-02-13 DIAGNOSIS — I120 Hypertensive chronic kidney disease with stage 5 chronic kidney disease or end stage renal disease: Secondary | ICD-10-CM | POA: Diagnosis not present

## 2017-02-14 DIAGNOSIS — D509 Iron deficiency anemia, unspecified: Secondary | ICD-10-CM | POA: Diagnosis not present

## 2017-02-14 DIAGNOSIS — N2581 Secondary hyperparathyroidism of renal origin: Secondary | ICD-10-CM | POA: Diagnosis not present

## 2017-02-14 DIAGNOSIS — D631 Anemia in chronic kidney disease: Secondary | ICD-10-CM | POA: Diagnosis not present

## 2017-02-14 DIAGNOSIS — Z992 Dependence on renal dialysis: Secondary | ICD-10-CM | POA: Diagnosis not present

## 2017-02-14 DIAGNOSIS — N186 End stage renal disease: Secondary | ICD-10-CM | POA: Diagnosis not present

## 2017-02-15 DIAGNOSIS — J449 Chronic obstructive pulmonary disease, unspecified: Secondary | ICD-10-CM | POA: Diagnosis not present

## 2017-02-15 DIAGNOSIS — E1122 Type 2 diabetes mellitus with diabetic chronic kidney disease: Secondary | ICD-10-CM | POA: Diagnosis not present

## 2017-02-15 DIAGNOSIS — I120 Hypertensive chronic kidney disease with stage 5 chronic kidney disease or end stage renal disease: Secondary | ICD-10-CM | POA: Diagnosis not present

## 2017-02-15 DIAGNOSIS — N186 End stage renal disease: Secondary | ICD-10-CM | POA: Diagnosis not present

## 2017-02-15 DIAGNOSIS — L89151 Pressure ulcer of sacral region, stage 1: Secondary | ICD-10-CM | POA: Diagnosis not present

## 2017-02-15 DIAGNOSIS — I4891 Unspecified atrial fibrillation: Secondary | ICD-10-CM | POA: Diagnosis not present

## 2017-02-15 DIAGNOSIS — R001 Bradycardia, unspecified: Secondary | ICD-10-CM | POA: Diagnosis not present

## 2017-02-16 DIAGNOSIS — D509 Iron deficiency anemia, unspecified: Secondary | ICD-10-CM | POA: Diagnosis not present

## 2017-02-16 DIAGNOSIS — N186 End stage renal disease: Secondary | ICD-10-CM | POA: Diagnosis not present

## 2017-02-16 DIAGNOSIS — D631 Anemia in chronic kidney disease: Secondary | ICD-10-CM | POA: Diagnosis not present

## 2017-02-16 DIAGNOSIS — N2581 Secondary hyperparathyroidism of renal origin: Secondary | ICD-10-CM | POA: Diagnosis not present

## 2017-02-16 DIAGNOSIS — Z992 Dependence on renal dialysis: Secondary | ICD-10-CM | POA: Diagnosis not present

## 2017-02-19 DIAGNOSIS — Z992 Dependence on renal dialysis: Secondary | ICD-10-CM | POA: Diagnosis not present

## 2017-02-19 DIAGNOSIS — N186 End stage renal disease: Secondary | ICD-10-CM | POA: Diagnosis not present

## 2017-02-19 DIAGNOSIS — D509 Iron deficiency anemia, unspecified: Secondary | ICD-10-CM | POA: Diagnosis not present

## 2017-02-19 DIAGNOSIS — D631 Anemia in chronic kidney disease: Secondary | ICD-10-CM | POA: Diagnosis not present

## 2017-02-19 DIAGNOSIS — N2581 Secondary hyperparathyroidism of renal origin: Secondary | ICD-10-CM | POA: Diagnosis not present

## 2017-02-20 DIAGNOSIS — J449 Chronic obstructive pulmonary disease, unspecified: Secondary | ICD-10-CM | POA: Diagnosis not present

## 2017-02-20 DIAGNOSIS — N186 End stage renal disease: Secondary | ICD-10-CM | POA: Diagnosis not present

## 2017-02-20 DIAGNOSIS — L89151 Pressure ulcer of sacral region, stage 1: Secondary | ICD-10-CM | POA: Diagnosis not present

## 2017-02-20 DIAGNOSIS — I120 Hypertensive chronic kidney disease with stage 5 chronic kidney disease or end stage renal disease: Secondary | ICD-10-CM | POA: Diagnosis not present

## 2017-02-20 DIAGNOSIS — I4891 Unspecified atrial fibrillation: Secondary | ICD-10-CM | POA: Diagnosis not present

## 2017-02-20 DIAGNOSIS — E1122 Type 2 diabetes mellitus with diabetic chronic kidney disease: Secondary | ICD-10-CM | POA: Diagnosis not present

## 2017-02-21 DIAGNOSIS — N186 End stage renal disease: Secondary | ICD-10-CM | POA: Diagnosis not present

## 2017-02-21 DIAGNOSIS — D509 Iron deficiency anemia, unspecified: Secondary | ICD-10-CM | POA: Diagnosis not present

## 2017-02-21 DIAGNOSIS — Z992 Dependence on renal dialysis: Secondary | ICD-10-CM | POA: Diagnosis not present

## 2017-02-21 DIAGNOSIS — D631 Anemia in chronic kidney disease: Secondary | ICD-10-CM | POA: Diagnosis not present

## 2017-02-21 DIAGNOSIS — N2581 Secondary hyperparathyroidism of renal origin: Secondary | ICD-10-CM | POA: Diagnosis not present

## 2017-02-22 DIAGNOSIS — E1122 Type 2 diabetes mellitus with diabetic chronic kidney disease: Secondary | ICD-10-CM | POA: Diagnosis not present

## 2017-02-22 DIAGNOSIS — L89151 Pressure ulcer of sacral region, stage 1: Secondary | ICD-10-CM | POA: Diagnosis not present

## 2017-02-22 DIAGNOSIS — I4891 Unspecified atrial fibrillation: Secondary | ICD-10-CM | POA: Diagnosis not present

## 2017-02-22 DIAGNOSIS — Z992 Dependence on renal dialysis: Secondary | ICD-10-CM | POA: Diagnosis not present

## 2017-02-22 DIAGNOSIS — J449 Chronic obstructive pulmonary disease, unspecified: Secondary | ICD-10-CM | POA: Diagnosis not present

## 2017-02-22 DIAGNOSIS — I120 Hypertensive chronic kidney disease with stage 5 chronic kidney disease or end stage renal disease: Secondary | ICD-10-CM | POA: Diagnosis not present

## 2017-02-22 DIAGNOSIS — N186 End stage renal disease: Secondary | ICD-10-CM | POA: Diagnosis not present

## 2017-02-23 DIAGNOSIS — D509 Iron deficiency anemia, unspecified: Secondary | ICD-10-CM | POA: Diagnosis not present

## 2017-02-23 DIAGNOSIS — N186 End stage renal disease: Secondary | ICD-10-CM | POA: Diagnosis not present

## 2017-02-23 DIAGNOSIS — Z992 Dependence on renal dialysis: Secondary | ICD-10-CM | POA: Diagnosis not present

## 2017-02-23 DIAGNOSIS — N2581 Secondary hyperparathyroidism of renal origin: Secondary | ICD-10-CM | POA: Diagnosis not present

## 2017-02-23 DIAGNOSIS — D631 Anemia in chronic kidney disease: Secondary | ICD-10-CM | POA: Diagnosis not present

## 2017-02-26 DIAGNOSIS — D509 Iron deficiency anemia, unspecified: Secondary | ICD-10-CM | POA: Diagnosis not present

## 2017-02-26 DIAGNOSIS — D631 Anemia in chronic kidney disease: Secondary | ICD-10-CM | POA: Diagnosis not present

## 2017-02-26 DIAGNOSIS — N2581 Secondary hyperparathyroidism of renal origin: Secondary | ICD-10-CM | POA: Diagnosis not present

## 2017-02-26 DIAGNOSIS — Z992 Dependence on renal dialysis: Secondary | ICD-10-CM | POA: Diagnosis not present

## 2017-02-26 DIAGNOSIS — N186 End stage renal disease: Secondary | ICD-10-CM | POA: Diagnosis not present

## 2017-02-27 DIAGNOSIS — N186 End stage renal disease: Secondary | ICD-10-CM | POA: Diagnosis not present

## 2017-02-27 DIAGNOSIS — I120 Hypertensive chronic kidney disease with stage 5 chronic kidney disease or end stage renal disease: Secondary | ICD-10-CM | POA: Diagnosis not present

## 2017-02-27 DIAGNOSIS — L89151 Pressure ulcer of sacral region, stage 1: Secondary | ICD-10-CM | POA: Diagnosis not present

## 2017-02-27 DIAGNOSIS — J449 Chronic obstructive pulmonary disease, unspecified: Secondary | ICD-10-CM | POA: Diagnosis not present

## 2017-02-27 DIAGNOSIS — E1122 Type 2 diabetes mellitus with diabetic chronic kidney disease: Secondary | ICD-10-CM | POA: Diagnosis not present

## 2017-02-27 DIAGNOSIS — I4891 Unspecified atrial fibrillation: Secondary | ICD-10-CM | POA: Diagnosis not present

## 2017-02-28 DIAGNOSIS — N186 End stage renal disease: Secondary | ICD-10-CM | POA: Diagnosis not present

## 2017-02-28 DIAGNOSIS — Z992 Dependence on renal dialysis: Secondary | ICD-10-CM | POA: Diagnosis not present

## 2017-02-28 DIAGNOSIS — D509 Iron deficiency anemia, unspecified: Secondary | ICD-10-CM | POA: Diagnosis not present

## 2017-02-28 DIAGNOSIS — D631 Anemia in chronic kidney disease: Secondary | ICD-10-CM | POA: Diagnosis not present

## 2017-02-28 DIAGNOSIS — N2581 Secondary hyperparathyroidism of renal origin: Secondary | ICD-10-CM | POA: Diagnosis not present

## 2017-03-01 DIAGNOSIS — I120 Hypertensive chronic kidney disease with stage 5 chronic kidney disease or end stage renal disease: Secondary | ICD-10-CM | POA: Diagnosis not present

## 2017-03-01 DIAGNOSIS — J449 Chronic obstructive pulmonary disease, unspecified: Secondary | ICD-10-CM | POA: Diagnosis not present

## 2017-03-01 DIAGNOSIS — L89151 Pressure ulcer of sacral region, stage 1: Secondary | ICD-10-CM | POA: Diagnosis not present

## 2017-03-01 DIAGNOSIS — E1122 Type 2 diabetes mellitus with diabetic chronic kidney disease: Secondary | ICD-10-CM | POA: Diagnosis not present

## 2017-03-01 DIAGNOSIS — I4891 Unspecified atrial fibrillation: Secondary | ICD-10-CM | POA: Diagnosis not present

## 2017-03-01 DIAGNOSIS — N186 End stage renal disease: Secondary | ICD-10-CM | POA: Diagnosis not present

## 2017-03-02 DIAGNOSIS — D509 Iron deficiency anemia, unspecified: Secondary | ICD-10-CM | POA: Diagnosis not present

## 2017-03-02 DIAGNOSIS — N2581 Secondary hyperparathyroidism of renal origin: Secondary | ICD-10-CM | POA: Diagnosis not present

## 2017-03-02 DIAGNOSIS — D631 Anemia in chronic kidney disease: Secondary | ICD-10-CM | POA: Diagnosis not present

## 2017-03-02 DIAGNOSIS — Z992 Dependence on renal dialysis: Secondary | ICD-10-CM | POA: Diagnosis not present

## 2017-03-02 DIAGNOSIS — N186 End stage renal disease: Secondary | ICD-10-CM | POA: Diagnosis not present

## 2017-03-05 DIAGNOSIS — Z992 Dependence on renal dialysis: Secondary | ICD-10-CM | POA: Diagnosis not present

## 2017-03-05 DIAGNOSIS — D509 Iron deficiency anemia, unspecified: Secondary | ICD-10-CM | POA: Diagnosis not present

## 2017-03-05 DIAGNOSIS — N186 End stage renal disease: Secondary | ICD-10-CM | POA: Diagnosis not present

## 2017-03-05 DIAGNOSIS — N2581 Secondary hyperparathyroidism of renal origin: Secondary | ICD-10-CM | POA: Diagnosis not present

## 2017-03-05 DIAGNOSIS — D631 Anemia in chronic kidney disease: Secondary | ICD-10-CM | POA: Diagnosis not present

## 2017-03-09 DIAGNOSIS — N186 End stage renal disease: Secondary | ICD-10-CM | POA: Diagnosis not present

## 2017-03-09 DIAGNOSIS — D509 Iron deficiency anemia, unspecified: Secondary | ICD-10-CM | POA: Diagnosis not present

## 2017-03-09 DIAGNOSIS — D631 Anemia in chronic kidney disease: Secondary | ICD-10-CM | POA: Diagnosis not present

## 2017-03-09 DIAGNOSIS — Z992 Dependence on renal dialysis: Secondary | ICD-10-CM | POA: Diagnosis not present

## 2017-03-09 DIAGNOSIS — N2581 Secondary hyperparathyroidism of renal origin: Secondary | ICD-10-CM | POA: Diagnosis not present

## 2017-03-12 DIAGNOSIS — D631 Anemia in chronic kidney disease: Secondary | ICD-10-CM | POA: Diagnosis not present

## 2017-03-12 DIAGNOSIS — N2581 Secondary hyperparathyroidism of renal origin: Secondary | ICD-10-CM | POA: Diagnosis not present

## 2017-03-12 DIAGNOSIS — Z992 Dependence on renal dialysis: Secondary | ICD-10-CM | POA: Diagnosis not present

## 2017-03-12 DIAGNOSIS — D509 Iron deficiency anemia, unspecified: Secondary | ICD-10-CM | POA: Diagnosis not present

## 2017-03-12 DIAGNOSIS — N186 End stage renal disease: Secondary | ICD-10-CM | POA: Diagnosis not present

## 2017-03-14 DIAGNOSIS — N2581 Secondary hyperparathyroidism of renal origin: Secondary | ICD-10-CM | POA: Diagnosis not present

## 2017-03-14 DIAGNOSIS — D509 Iron deficiency anemia, unspecified: Secondary | ICD-10-CM | POA: Diagnosis not present

## 2017-03-14 DIAGNOSIS — Z992 Dependence on renal dialysis: Secondary | ICD-10-CM | POA: Diagnosis not present

## 2017-03-14 DIAGNOSIS — N186 End stage renal disease: Secondary | ICD-10-CM | POA: Diagnosis not present

## 2017-03-14 DIAGNOSIS — D631 Anemia in chronic kidney disease: Secondary | ICD-10-CM | POA: Diagnosis not present

## 2017-03-17 DIAGNOSIS — N2581 Secondary hyperparathyroidism of renal origin: Secondary | ICD-10-CM | POA: Diagnosis not present

## 2017-03-17 DIAGNOSIS — D631 Anemia in chronic kidney disease: Secondary | ICD-10-CM | POA: Diagnosis not present

## 2017-03-17 DIAGNOSIS — N186 End stage renal disease: Secondary | ICD-10-CM | POA: Diagnosis not present

## 2017-03-17 DIAGNOSIS — Z992 Dependence on renal dialysis: Secondary | ICD-10-CM | POA: Diagnosis not present

## 2017-03-17 DIAGNOSIS — D509 Iron deficiency anemia, unspecified: Secondary | ICD-10-CM | POA: Diagnosis not present

## 2017-03-19 DIAGNOSIS — N2581 Secondary hyperparathyroidism of renal origin: Secondary | ICD-10-CM | POA: Diagnosis not present

## 2017-03-19 DIAGNOSIS — N186 End stage renal disease: Secondary | ICD-10-CM | POA: Diagnosis not present

## 2017-03-19 DIAGNOSIS — Z992 Dependence on renal dialysis: Secondary | ICD-10-CM | POA: Diagnosis not present

## 2017-03-19 DIAGNOSIS — D509 Iron deficiency anemia, unspecified: Secondary | ICD-10-CM | POA: Diagnosis not present

## 2017-03-19 DIAGNOSIS — D631 Anemia in chronic kidney disease: Secondary | ICD-10-CM | POA: Diagnosis not present

## 2017-03-21 DIAGNOSIS — D631 Anemia in chronic kidney disease: Secondary | ICD-10-CM | POA: Diagnosis not present

## 2017-03-21 DIAGNOSIS — D509 Iron deficiency anemia, unspecified: Secondary | ICD-10-CM | POA: Diagnosis not present

## 2017-03-21 DIAGNOSIS — Z992 Dependence on renal dialysis: Secondary | ICD-10-CM | POA: Diagnosis not present

## 2017-03-21 DIAGNOSIS — N2581 Secondary hyperparathyroidism of renal origin: Secondary | ICD-10-CM | POA: Diagnosis not present

## 2017-03-21 DIAGNOSIS — N186 End stage renal disease: Secondary | ICD-10-CM | POA: Diagnosis not present

## 2017-03-21 IMAGING — CT CT ABD-PELV W/O CM
2 of 8 series · 15 of 46 positions shown, 17 images · non-contrast
Comparison: CTs 01/01/2013.

CLINICAL DATA: Diarrhea for several months. On antibiotic therapy
for 9 days without improvement. History of end-stage renal disease,
renal transplant and diabetes.

EXAM:
CT ABDOMEN AND PELVIS WITHOUT CONTRAST
TECHNIQUE: Multidetector CT imaging of the abdomen and pelvis was performed
following the standard protocol without IV contrast.

[Series 2: axial st · axial · 0.73mm/px · z∈[+848,+1258]mm · 12 of 94 slices shown, 14 images]
[im 6/94  soft-tissue]
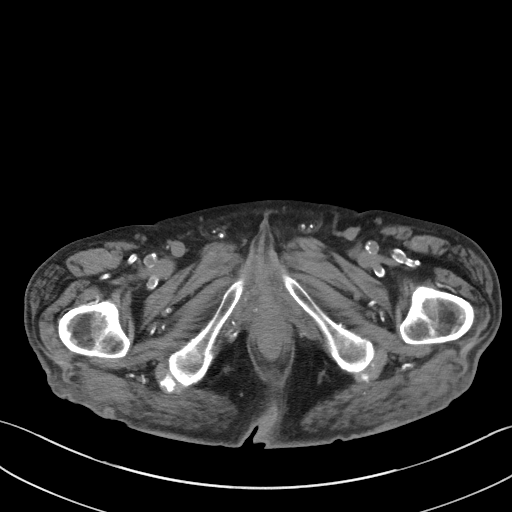
[im 6/94  bone]
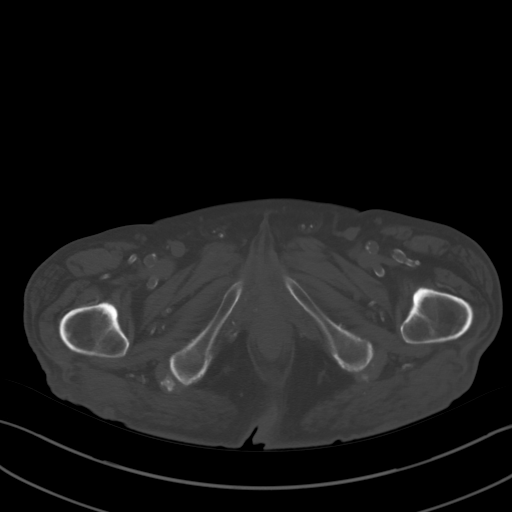
[im 16/94  soft-tissue]
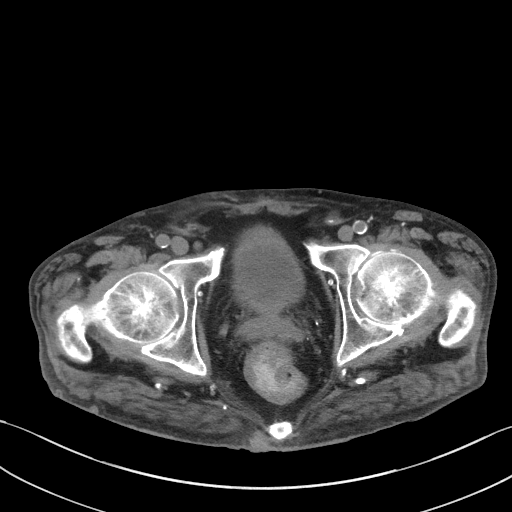
[im 21/94  soft-tissue]
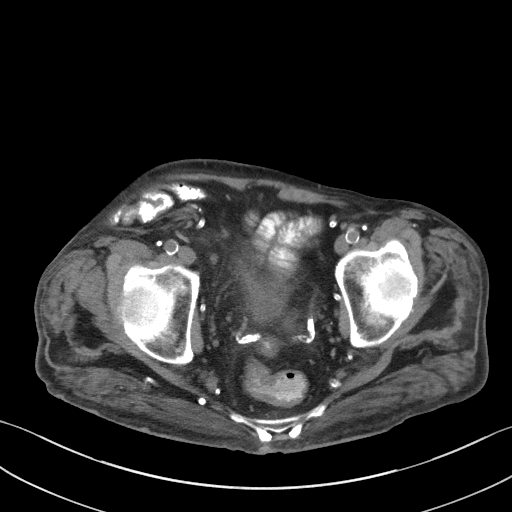
[im 26/94  soft-tissue]
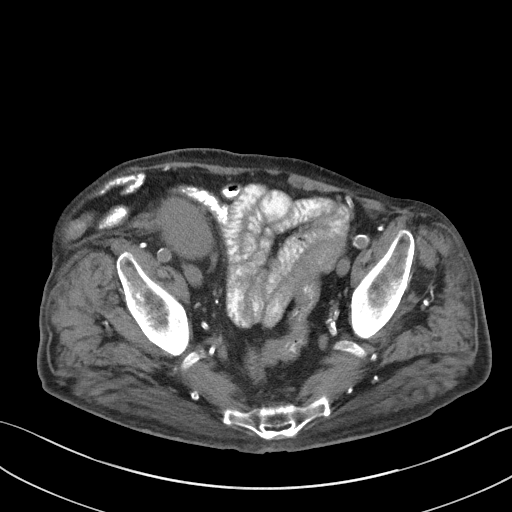
[im 37/94  soft-tissue]
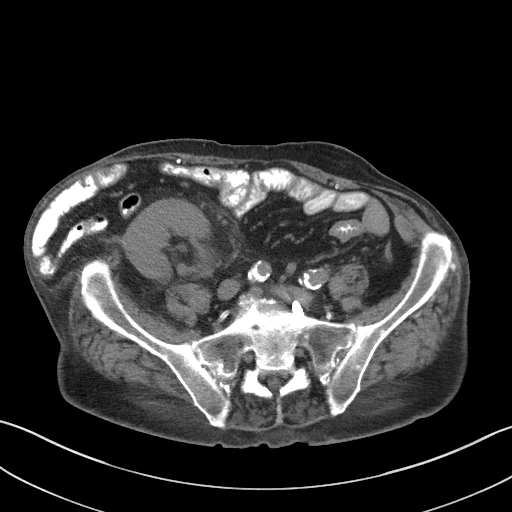
[im 42/94  soft-tissue]
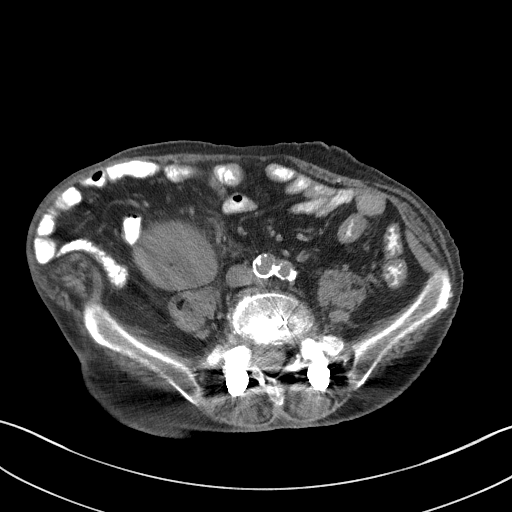
[im 52/94  soft-tissue]
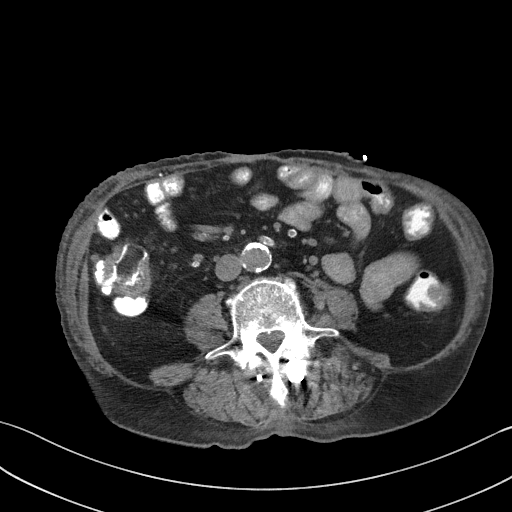
[im 57/94  soft-tissue]
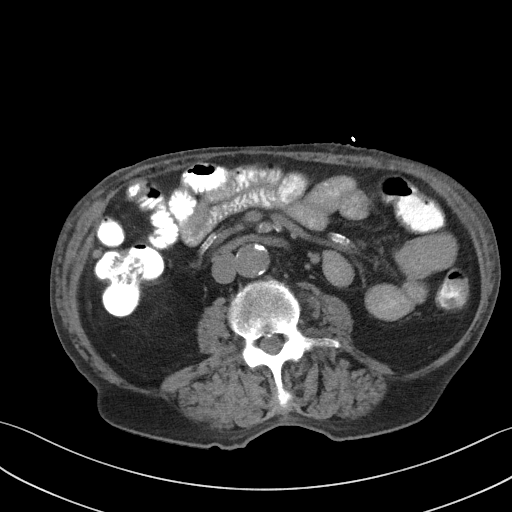
[im 68/94  soft-tissue]
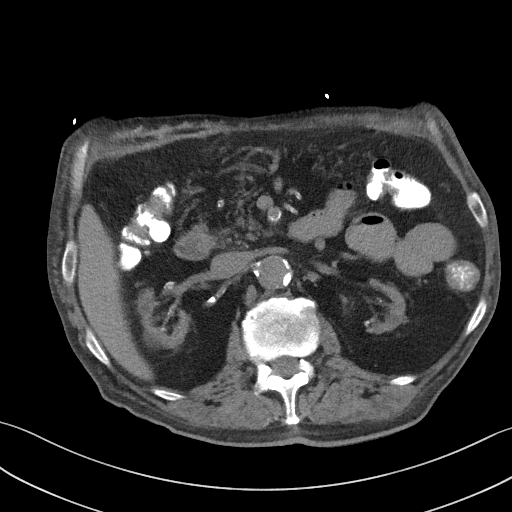
[im 68/94  bone]
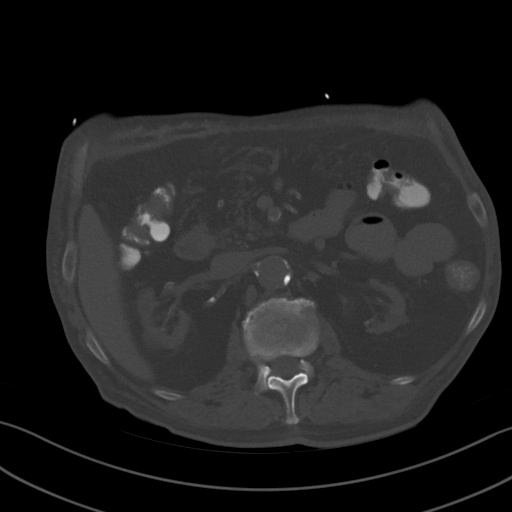
[im 73/94  soft-tissue]
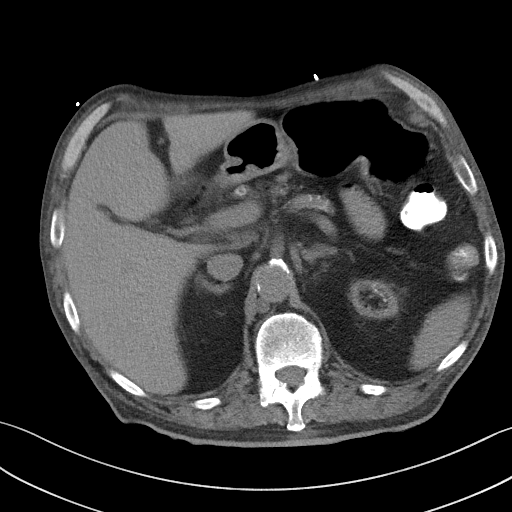
[im 78/94  soft-tissue]
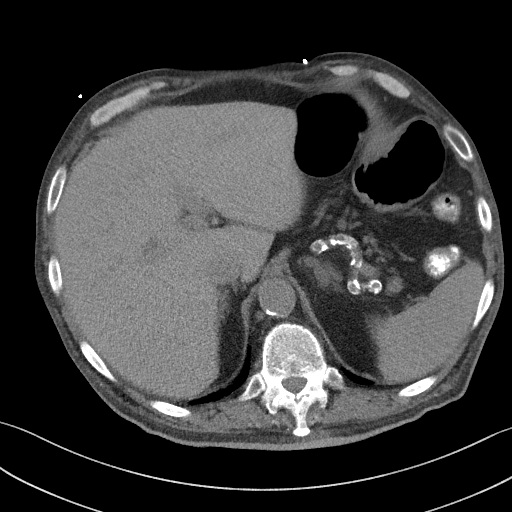
[im 88/94  soft-tissue]
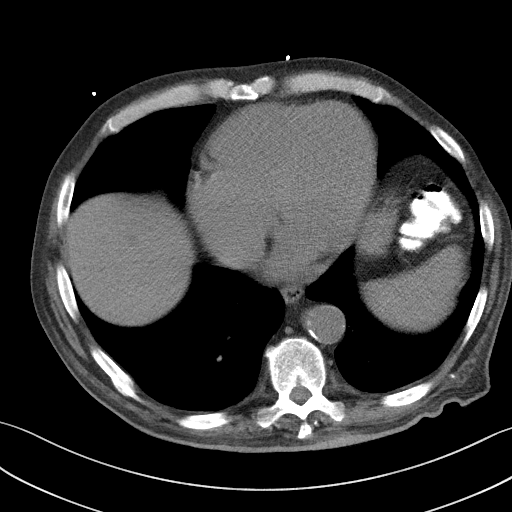

[Series 8: coronal st · coronal · 0.76mm/px · 3 of 94 slices shown]
[im 24/94  soft-tissue]
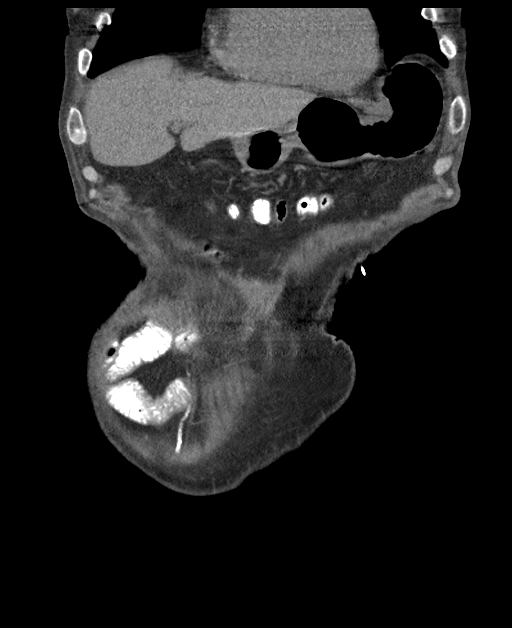
[im 47/94  soft-tissue]
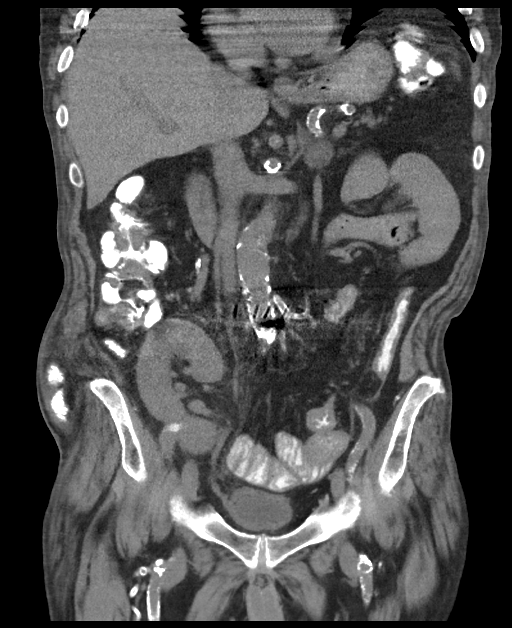
[im 70/94  soft-tissue]
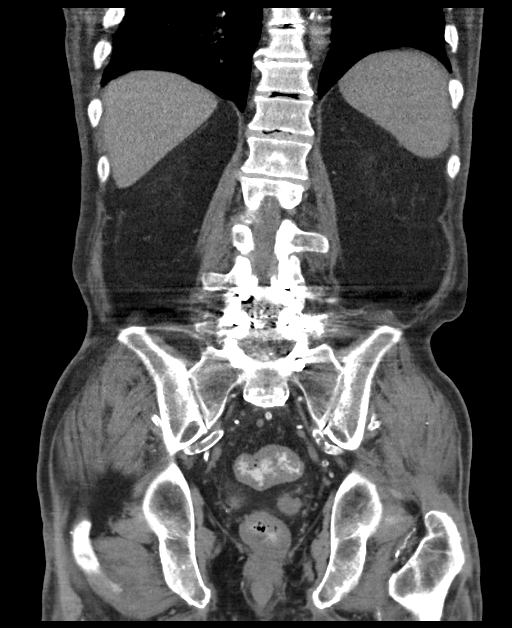

[15 of 46 positions shown; findings below may reference images not displayed]

FINDINGS: Lower chest: Extensive coronary artery atherosclerosis noted. There
is lesser atherosclerosis and tortuosity of the thoracic aorta. The
lung bases are clear. There is no pleural effusion. Bilateral
gynecomastia noted.

Hepatobiliary: The liver appears unremarkable as imaged in the
noncontrast state. No significant biliary dilatation post
cholecystectomy.

Pancreas: Atrophied with fatty replacement. No evidence of
pancreatic mass, ductal dilatation or surrounding inflammation.

Spleen: Normal in size without focal abnormality.

Adrenals/Urinary Tract: The adrenal glands appear stable without
suspicious findings. The native kidneys are markedly atrophied with
diffuse cortical thinning. There are enlarging low-density renal
lesions bilaterally, more numerous on the right, probably all cysts
based on attenuation. There is no hydronephrosis. Transplanted
kidney in the right iliac fossa demonstrates a probable cyst in its
lower pole, measuring up to 2.0 cm. No evidence of urinary tract
calculus per there is mild perinephric soft tissue stranding around
the transplanted kidney. Prominence of the bladder wall is probably
related to incomplete distention.

Stomach/Bowel: The stomach and small bowel demonstrate no
significant findings. The appendix appears normal. Allowing for
incomplete colonic distension, there is no definite colonic wall
thickening or surrounding inflammation. There is stable laxity of
the anterior abdominal wall in the right lower quadrant, likely a
broad-based incisional hernia. This appears unchanged.

Vascular/Lymphatic: There are no enlarged abdominal or pelvic lymph
nodes. Extensive aortic and branch vessel atherosclerosis.

Reproductive: The prostate gland and seminal vesicles appear
unremarkable. Calcifications of the vas deferens noted.

Other: No ascites. As above, stable laxity of the anterior abdominal
wall in the right lower quadrant, probably an incisional hernia.

Musculoskeletal: No acute or significant osseous findings. Previous
L4 through S1 fusion.
IMPRESSION: 1. No definite signs of colitis. The colon is incompletely distended
without gross wall thickening or surrounding inflammation.
2. Perinephric soft tissue stranding around the transplant kidney in
the right iliac fossa, potentially inflammatory. No evidence of
urinary tract calculus or hydronephrosis. Probable cyst in the lower
pole of the transplanted kidney.
3. Chronic atrophy of the native kidneys with enlarging low-density
lesions, likely cysts.
4. Probable chronic incisional hernia in the right lower quadrant.
5. Extensive atherosclerosis.

## 2017-03-22 DIAGNOSIS — Z992 Dependence on renal dialysis: Secondary | ICD-10-CM | POA: Diagnosis not present

## 2017-03-22 DIAGNOSIS — N186 End stage renal disease: Secondary | ICD-10-CM | POA: Diagnosis not present

## 2017-03-23 DIAGNOSIS — Z992 Dependence on renal dialysis: Secondary | ICD-10-CM | POA: Diagnosis not present

## 2017-03-23 DIAGNOSIS — N186 End stage renal disease: Secondary | ICD-10-CM | POA: Diagnosis not present

## 2017-03-23 DIAGNOSIS — N2581 Secondary hyperparathyroidism of renal origin: Secondary | ICD-10-CM | POA: Diagnosis not present

## 2017-03-23 DIAGNOSIS — D509 Iron deficiency anemia, unspecified: Secondary | ICD-10-CM | POA: Diagnosis not present

## 2017-03-23 DIAGNOSIS — D631 Anemia in chronic kidney disease: Secondary | ICD-10-CM | POA: Diagnosis not present

## 2017-03-26 ENCOUNTER — Other Ambulatory Visit: Payer: Self-pay | Admitting: Gastroenterology

## 2017-03-26 DIAGNOSIS — Z992 Dependence on renal dialysis: Secondary | ICD-10-CM | POA: Diagnosis not present

## 2017-03-26 DIAGNOSIS — N186 End stage renal disease: Secondary | ICD-10-CM | POA: Diagnosis not present

## 2017-03-26 DIAGNOSIS — D631 Anemia in chronic kidney disease: Secondary | ICD-10-CM | POA: Diagnosis not present

## 2017-03-26 DIAGNOSIS — D509 Iron deficiency anemia, unspecified: Secondary | ICD-10-CM | POA: Diagnosis not present

## 2017-03-26 DIAGNOSIS — N2581 Secondary hyperparathyroidism of renal origin: Secondary | ICD-10-CM | POA: Diagnosis not present

## 2017-03-28 DIAGNOSIS — D509 Iron deficiency anemia, unspecified: Secondary | ICD-10-CM | POA: Diagnosis not present

## 2017-03-28 DIAGNOSIS — D631 Anemia in chronic kidney disease: Secondary | ICD-10-CM | POA: Diagnosis not present

## 2017-03-28 DIAGNOSIS — N2581 Secondary hyperparathyroidism of renal origin: Secondary | ICD-10-CM | POA: Diagnosis not present

## 2017-03-28 DIAGNOSIS — N186 End stage renal disease: Secondary | ICD-10-CM | POA: Diagnosis not present

## 2017-03-28 DIAGNOSIS — Z992 Dependence on renal dialysis: Secondary | ICD-10-CM | POA: Diagnosis not present

## 2017-03-30 DIAGNOSIS — N186 End stage renal disease: Secondary | ICD-10-CM | POA: Diagnosis not present

## 2017-03-30 DIAGNOSIS — N2581 Secondary hyperparathyroidism of renal origin: Secondary | ICD-10-CM | POA: Diagnosis not present

## 2017-03-30 DIAGNOSIS — Z992 Dependence on renal dialysis: Secondary | ICD-10-CM | POA: Diagnosis not present

## 2017-03-30 DIAGNOSIS — D631 Anemia in chronic kidney disease: Secondary | ICD-10-CM | POA: Diagnosis not present

## 2017-03-30 DIAGNOSIS — D509 Iron deficiency anemia, unspecified: Secondary | ICD-10-CM | POA: Diagnosis not present

## 2017-04-02 DIAGNOSIS — N186 End stage renal disease: Secondary | ICD-10-CM | POA: Diagnosis not present

## 2017-04-02 DIAGNOSIS — Z992 Dependence on renal dialysis: Secondary | ICD-10-CM | POA: Diagnosis not present

## 2017-04-02 DIAGNOSIS — D631 Anemia in chronic kidney disease: Secondary | ICD-10-CM | POA: Diagnosis not present

## 2017-04-02 DIAGNOSIS — N2581 Secondary hyperparathyroidism of renal origin: Secondary | ICD-10-CM | POA: Diagnosis not present

## 2017-04-02 DIAGNOSIS — D509 Iron deficiency anemia, unspecified: Secondary | ICD-10-CM | POA: Diagnosis not present

## 2017-04-04 DIAGNOSIS — Z992 Dependence on renal dialysis: Secondary | ICD-10-CM | POA: Diagnosis not present

## 2017-04-04 DIAGNOSIS — D631 Anemia in chronic kidney disease: Secondary | ICD-10-CM | POA: Diagnosis not present

## 2017-04-04 DIAGNOSIS — N2581 Secondary hyperparathyroidism of renal origin: Secondary | ICD-10-CM | POA: Diagnosis not present

## 2017-04-04 DIAGNOSIS — N186 End stage renal disease: Secondary | ICD-10-CM | POA: Diagnosis not present

## 2017-04-04 DIAGNOSIS — D509 Iron deficiency anemia, unspecified: Secondary | ICD-10-CM | POA: Diagnosis not present

## 2017-04-05 DIAGNOSIS — R001 Bradycardia, unspecified: Secondary | ICD-10-CM | POA: Diagnosis not present

## 2017-04-05 DIAGNOSIS — E1121 Type 2 diabetes mellitus with diabetic nephropathy: Secondary | ICD-10-CM | POA: Diagnosis not present

## 2017-04-05 DIAGNOSIS — I10 Essential (primary) hypertension: Secondary | ICD-10-CM | POA: Diagnosis not present

## 2017-04-05 DIAGNOSIS — F0280 Dementia in other diseases classified elsewhere without behavioral disturbance: Secondary | ICD-10-CM | POA: Diagnosis not present

## 2017-04-06 DIAGNOSIS — Z992 Dependence on renal dialysis: Secondary | ICD-10-CM | POA: Diagnosis not present

## 2017-04-06 DIAGNOSIS — D509 Iron deficiency anemia, unspecified: Secondary | ICD-10-CM | POA: Diagnosis not present

## 2017-04-06 DIAGNOSIS — N186 End stage renal disease: Secondary | ICD-10-CM | POA: Diagnosis not present

## 2017-04-06 DIAGNOSIS — N2581 Secondary hyperparathyroidism of renal origin: Secondary | ICD-10-CM | POA: Diagnosis not present

## 2017-04-06 DIAGNOSIS — D631 Anemia in chronic kidney disease: Secondary | ICD-10-CM | POA: Diagnosis not present

## 2017-04-09 DIAGNOSIS — Z992 Dependence on renal dialysis: Secondary | ICD-10-CM | POA: Diagnosis not present

## 2017-04-09 DIAGNOSIS — N186 End stage renal disease: Secondary | ICD-10-CM | POA: Diagnosis not present

## 2017-04-09 DIAGNOSIS — D631 Anemia in chronic kidney disease: Secondary | ICD-10-CM | POA: Diagnosis not present

## 2017-04-09 DIAGNOSIS — D509 Iron deficiency anemia, unspecified: Secondary | ICD-10-CM | POA: Diagnosis not present

## 2017-04-09 DIAGNOSIS — N2581 Secondary hyperparathyroidism of renal origin: Secondary | ICD-10-CM | POA: Diagnosis not present

## 2017-04-11 DIAGNOSIS — D638 Anemia in other chronic diseases classified elsewhere: Secondary | ICD-10-CM | POA: Diagnosis present

## 2017-04-11 DIAGNOSIS — Z79899 Other long term (current) drug therapy: Secondary | ICD-10-CM | POA: Diagnosis not present

## 2017-04-11 DIAGNOSIS — Z7982 Long term (current) use of aspirin: Secondary | ICD-10-CM | POA: Diagnosis not present

## 2017-04-11 DIAGNOSIS — F23 Brief psychotic disorder: Secondary | ICD-10-CM | POA: Diagnosis not present

## 2017-04-11 DIAGNOSIS — Z743 Need for continuous supervision: Secondary | ICD-10-CM | POA: Diagnosis not present

## 2017-04-11 DIAGNOSIS — Z9181 History of falling: Secondary | ICD-10-CM | POA: Diagnosis not present

## 2017-04-11 DIAGNOSIS — F4489 Other dissociative and conversion disorders: Secondary | ICD-10-CM | POA: Diagnosis not present

## 2017-04-11 DIAGNOSIS — Z794 Long term (current) use of insulin: Secondary | ICD-10-CM | POA: Diagnosis not present

## 2017-04-11 DIAGNOSIS — I951 Orthostatic hypotension: Secondary | ICD-10-CM | POA: Diagnosis present

## 2017-04-11 DIAGNOSIS — M1990 Unspecified osteoarthritis, unspecified site: Secondary | ICD-10-CM | POA: Diagnosis present

## 2017-04-11 DIAGNOSIS — A419 Sepsis, unspecified organism: Secondary | ICD-10-CM | POA: Diagnosis not present

## 2017-04-11 DIAGNOSIS — I120 Hypertensive chronic kidney disease with stage 5 chronic kidney disease or end stage renal disease: Secondary | ICD-10-CM | POA: Diagnosis not present

## 2017-04-11 DIAGNOSIS — Z886 Allergy status to analgesic agent status: Secondary | ICD-10-CM | POA: Diagnosis not present

## 2017-04-11 DIAGNOSIS — N186 End stage renal disease: Secondary | ICD-10-CM | POA: Diagnosis not present

## 2017-04-11 DIAGNOSIS — R1312 Dysphagia, oropharyngeal phase: Secondary | ICD-10-CM | POA: Diagnosis not present

## 2017-04-11 DIAGNOSIS — R279 Unspecified lack of coordination: Secondary | ICD-10-CM | POA: Diagnosis not present

## 2017-04-11 DIAGNOSIS — D649 Anemia, unspecified: Secondary | ICD-10-CM | POA: Diagnosis not present

## 2017-04-11 DIAGNOSIS — I10 Essential (primary) hypertension: Secondary | ICD-10-CM | POA: Diagnosis not present

## 2017-04-11 DIAGNOSIS — D631 Anemia in chronic kidney disease: Secondary | ICD-10-CM | POA: Diagnosis not present

## 2017-04-11 DIAGNOSIS — E785 Hyperlipidemia, unspecified: Secondary | ICD-10-CM | POA: Diagnosis not present

## 2017-04-11 DIAGNOSIS — F339 Major depressive disorder, recurrent, unspecified: Secondary | ICD-10-CM | POA: Diagnosis not present

## 2017-04-11 DIAGNOSIS — Z741 Need for assistance with personal care: Secondary | ICD-10-CM | POA: Diagnosis not present

## 2017-04-11 DIAGNOSIS — M898X9 Other specified disorders of bone, unspecified site: Secondary | ICD-10-CM | POA: Diagnosis present

## 2017-04-11 DIAGNOSIS — R41841 Cognitive communication deficit: Secondary | ICD-10-CM | POA: Diagnosis not present

## 2017-04-11 DIAGNOSIS — E1151 Type 2 diabetes mellitus with diabetic peripheral angiopathy without gangrene: Secondary | ICD-10-CM | POA: Diagnosis present

## 2017-04-11 DIAGNOSIS — Z992 Dependence on renal dialysis: Secondary | ICD-10-CM | POA: Diagnosis not present

## 2017-04-11 DIAGNOSIS — R4182 Altered mental status, unspecified: Secondary | ICD-10-CM | POA: Diagnosis not present

## 2017-04-11 DIAGNOSIS — I69318 Other symptoms and signs involving cognitive functions following cerebral infarction: Secondary | ICD-10-CM | POA: Diagnosis not present

## 2017-04-11 DIAGNOSIS — Z955 Presence of coronary angioplasty implant and graft: Secondary | ICD-10-CM | POA: Diagnosis not present

## 2017-04-11 DIAGNOSIS — F0150 Vascular dementia without behavioral disturbance: Secondary | ICD-10-CM | POA: Diagnosis present

## 2017-04-11 DIAGNOSIS — I2510 Atherosclerotic heart disease of native coronary artery without angina pectoris: Secondary | ICD-10-CM | POA: Diagnosis present

## 2017-04-11 DIAGNOSIS — J449 Chronic obstructive pulmonary disease, unspecified: Secondary | ICD-10-CM | POA: Diagnosis present

## 2017-04-11 DIAGNOSIS — R262 Difficulty in walking, not elsewhere classified: Secondary | ICD-10-CM | POA: Diagnosis not present

## 2017-04-11 DIAGNOSIS — Z880 Allergy status to penicillin: Secondary | ICD-10-CM | POA: Diagnosis not present

## 2017-04-11 DIAGNOSIS — R05 Cough: Secondary | ICD-10-CM | POA: Diagnosis not present

## 2017-04-11 DIAGNOSIS — R410 Disorientation, unspecified: Secondary | ICD-10-CM | POA: Diagnosis not present

## 2017-04-11 DIAGNOSIS — Z8249 Family history of ischemic heart disease and other diseases of the circulatory system: Secondary | ICD-10-CM | POA: Diagnosis not present

## 2017-04-11 DIAGNOSIS — Z8673 Personal history of transient ischemic attack (TIA), and cerebral infarction without residual deficits: Secondary | ICD-10-CM | POA: Diagnosis not present

## 2017-04-11 DIAGNOSIS — E1122 Type 2 diabetes mellitus with diabetic chronic kidney disease: Secondary | ICD-10-CM | POA: Diagnosis present

## 2017-04-11 DIAGNOSIS — E1165 Type 2 diabetes mellitus with hyperglycemia: Secondary | ICD-10-CM | POA: Diagnosis not present

## 2017-04-11 DIAGNOSIS — T8612 Kidney transplant failure: Secondary | ICD-10-CM | POA: Diagnosis not present

## 2017-04-11 DIAGNOSIS — E860 Dehydration: Secondary | ICD-10-CM | POA: Diagnosis present

## 2017-04-11 DIAGNOSIS — M6281 Muscle weakness (generalized): Secondary | ICD-10-CM | POA: Diagnosis not present

## 2017-04-11 DIAGNOSIS — M5135 Other intervertebral disc degeneration, thoracolumbar region: Secondary | ICD-10-CM | POA: Diagnosis not present

## 2017-04-11 DIAGNOSIS — F05 Delirium due to known physiological condition: Secondary | ICD-10-CM | POA: Diagnosis not present

## 2017-04-14 DIAGNOSIS — R41841 Cognitive communication deficit: Secondary | ICD-10-CM | POA: Diagnosis not present

## 2017-04-14 DIAGNOSIS — I69318 Other symptoms and signs involving cognitive functions following cerebral infarction: Secondary | ICD-10-CM | POA: Diagnosis not present

## 2017-04-14 DIAGNOSIS — N2581 Secondary hyperparathyroidism of renal origin: Secondary | ICD-10-CM | POA: Diagnosis not present

## 2017-04-14 DIAGNOSIS — D631 Anemia in chronic kidney disease: Secondary | ICD-10-CM | POA: Diagnosis not present

## 2017-04-14 DIAGNOSIS — Z8673 Personal history of transient ischemic attack (TIA), and cerebral infarction without residual deficits: Secondary | ICD-10-CM | POA: Diagnosis not present

## 2017-04-14 DIAGNOSIS — Z743 Need for continuous supervision: Secondary | ICD-10-CM | POA: Diagnosis not present

## 2017-04-14 DIAGNOSIS — F0150 Vascular dementia without behavioral disturbance: Secondary | ICD-10-CM | POA: Diagnosis not present

## 2017-04-14 DIAGNOSIS — Z794 Long term (current) use of insulin: Secondary | ICD-10-CM | POA: Diagnosis not present

## 2017-04-14 DIAGNOSIS — I951 Orthostatic hypotension: Secondary | ICD-10-CM | POA: Diagnosis not present

## 2017-04-14 DIAGNOSIS — R4182 Altered mental status, unspecified: Secondary | ICD-10-CM | POA: Diagnosis not present

## 2017-04-14 DIAGNOSIS — E785 Hyperlipidemia, unspecified: Secondary | ICD-10-CM | POA: Diagnosis not present

## 2017-04-14 DIAGNOSIS — R1312 Dysphagia, oropharyngeal phase: Secondary | ICD-10-CM | POA: Diagnosis not present

## 2017-04-14 DIAGNOSIS — E1165 Type 2 diabetes mellitus with hyperglycemia: Secondary | ICD-10-CM | POA: Diagnosis not present

## 2017-04-14 DIAGNOSIS — Z992 Dependence on renal dialysis: Secondary | ICD-10-CM | POA: Diagnosis not present

## 2017-04-14 DIAGNOSIS — E860 Dehydration: Secondary | ICD-10-CM | POA: Diagnosis not present

## 2017-04-14 DIAGNOSIS — I120 Hypertensive chronic kidney disease with stage 5 chronic kidney disease or end stage renal disease: Secondary | ICD-10-CM | POA: Diagnosis not present

## 2017-04-14 DIAGNOSIS — I2510 Atherosclerotic heart disease of native coronary artery without angina pectoris: Secondary | ICD-10-CM | POA: Diagnosis not present

## 2017-04-14 DIAGNOSIS — R262 Difficulty in walking, not elsewhere classified: Secondary | ICD-10-CM | POA: Diagnosis not present

## 2017-04-14 DIAGNOSIS — R410 Disorientation, unspecified: Secondary | ICD-10-CM | POA: Diagnosis not present

## 2017-04-14 DIAGNOSIS — F339 Major depressive disorder, recurrent, unspecified: Secondary | ICD-10-CM | POA: Diagnosis not present

## 2017-04-14 DIAGNOSIS — M6281 Muscle weakness (generalized): Secondary | ICD-10-CM | POA: Diagnosis not present

## 2017-04-14 DIAGNOSIS — E119 Type 2 diabetes mellitus without complications: Secondary | ICD-10-CM | POA: Diagnosis not present

## 2017-04-14 DIAGNOSIS — N186 End stage renal disease: Secondary | ICD-10-CM | POA: Diagnosis not present

## 2017-04-14 DIAGNOSIS — M5135 Other intervertebral disc degeneration, thoracolumbar region: Secondary | ICD-10-CM | POA: Diagnosis not present

## 2017-04-14 DIAGNOSIS — R279 Unspecified lack of coordination: Secondary | ICD-10-CM | POA: Diagnosis not present

## 2017-04-14 DIAGNOSIS — Z741 Need for assistance with personal care: Secondary | ICD-10-CM | POA: Diagnosis not present

## 2017-04-14 DIAGNOSIS — D509 Iron deficiency anemia, unspecified: Secondary | ICD-10-CM | POA: Diagnosis not present

## 2017-04-16 DIAGNOSIS — N186 End stage renal disease: Secondary | ICD-10-CM | POA: Diagnosis not present

## 2017-04-16 DIAGNOSIS — Z992 Dependence on renal dialysis: Secondary | ICD-10-CM | POA: Diagnosis not present

## 2017-04-16 DIAGNOSIS — N2581 Secondary hyperparathyroidism of renal origin: Secondary | ICD-10-CM | POA: Diagnosis not present

## 2017-04-16 DIAGNOSIS — D631 Anemia in chronic kidney disease: Secondary | ICD-10-CM | POA: Diagnosis not present

## 2017-04-16 DIAGNOSIS — D509 Iron deficiency anemia, unspecified: Secondary | ICD-10-CM | POA: Diagnosis not present

## 2017-04-17 DIAGNOSIS — I2510 Atherosclerotic heart disease of native coronary artery without angina pectoris: Secondary | ICD-10-CM | POA: Diagnosis not present

## 2017-04-17 DIAGNOSIS — N186 End stage renal disease: Secondary | ICD-10-CM | POA: Diagnosis not present

## 2017-04-17 DIAGNOSIS — E1165 Type 2 diabetes mellitus with hyperglycemia: Secondary | ICD-10-CM | POA: Diagnosis not present

## 2017-04-17 DIAGNOSIS — F0150 Vascular dementia without behavioral disturbance: Secondary | ICD-10-CM | POA: Diagnosis not present

## 2017-04-18 DIAGNOSIS — N186 End stage renal disease: Secondary | ICD-10-CM | POA: Diagnosis not present

## 2017-04-18 DIAGNOSIS — Z992 Dependence on renal dialysis: Secondary | ICD-10-CM | POA: Diagnosis not present

## 2017-04-18 DIAGNOSIS — D509 Iron deficiency anemia, unspecified: Secondary | ICD-10-CM | POA: Diagnosis not present

## 2017-04-18 DIAGNOSIS — D631 Anemia in chronic kidney disease: Secondary | ICD-10-CM | POA: Diagnosis not present

## 2017-04-18 DIAGNOSIS — N2581 Secondary hyperparathyroidism of renal origin: Secondary | ICD-10-CM | POA: Diagnosis not present

## 2017-04-19 DIAGNOSIS — E1165 Type 2 diabetes mellitus with hyperglycemia: Secondary | ICD-10-CM | POA: Diagnosis not present

## 2017-04-19 DIAGNOSIS — M5135 Other intervertebral disc degeneration, thoracolumbar region: Secondary | ICD-10-CM | POA: Diagnosis not present

## 2017-04-19 DIAGNOSIS — I2510 Atherosclerotic heart disease of native coronary artery without angina pectoris: Secondary | ICD-10-CM | POA: Diagnosis not present

## 2017-04-19 DIAGNOSIS — N186 End stage renal disease: Secondary | ICD-10-CM | POA: Diagnosis not present

## 2017-04-20 DIAGNOSIS — D509 Iron deficiency anemia, unspecified: Secondary | ICD-10-CM | POA: Diagnosis not present

## 2017-04-20 DIAGNOSIS — D631 Anemia in chronic kidney disease: Secondary | ICD-10-CM | POA: Diagnosis not present

## 2017-04-20 DIAGNOSIS — Z992 Dependence on renal dialysis: Secondary | ICD-10-CM | POA: Diagnosis not present

## 2017-04-20 DIAGNOSIS — N186 End stage renal disease: Secondary | ICD-10-CM | POA: Diagnosis not present

## 2017-04-20 DIAGNOSIS — N2581 Secondary hyperparathyroidism of renal origin: Secondary | ICD-10-CM | POA: Diagnosis not present

## 2017-04-22 DIAGNOSIS — N186 End stage renal disease: Secondary | ICD-10-CM | POA: Diagnosis not present

## 2017-04-22 DIAGNOSIS — Z992 Dependence on renal dialysis: Secondary | ICD-10-CM | POA: Diagnosis not present

## 2017-04-23 DIAGNOSIS — D631 Anemia in chronic kidney disease: Secondary | ICD-10-CM | POA: Diagnosis not present

## 2017-04-23 DIAGNOSIS — D509 Iron deficiency anemia, unspecified: Secondary | ICD-10-CM | POA: Diagnosis not present

## 2017-04-23 DIAGNOSIS — N2581 Secondary hyperparathyroidism of renal origin: Secondary | ICD-10-CM | POA: Diagnosis not present

## 2017-04-23 DIAGNOSIS — Z992 Dependence on renal dialysis: Secondary | ICD-10-CM | POA: Diagnosis not present

## 2017-04-23 DIAGNOSIS — F0150 Vascular dementia without behavioral disturbance: Secondary | ICD-10-CM | POA: Diagnosis not present

## 2017-04-23 DIAGNOSIS — N186 End stage renal disease: Secondary | ICD-10-CM | POA: Diagnosis not present

## 2017-04-23 DIAGNOSIS — E1165 Type 2 diabetes mellitus with hyperglycemia: Secondary | ICD-10-CM | POA: Diagnosis not present

## 2017-04-23 DIAGNOSIS — I2510 Atherosclerotic heart disease of native coronary artery without angina pectoris: Secondary | ICD-10-CM | POA: Diagnosis not present

## 2017-04-24 DIAGNOSIS — F0150 Vascular dementia without behavioral disturbance: Secondary | ICD-10-CM | POA: Diagnosis not present

## 2017-04-24 DIAGNOSIS — R4182 Altered mental status, unspecified: Secondary | ICD-10-CM | POA: Diagnosis not present

## 2017-04-25 DIAGNOSIS — D509 Iron deficiency anemia, unspecified: Secondary | ICD-10-CM | POA: Diagnosis not present

## 2017-04-25 DIAGNOSIS — N2581 Secondary hyperparathyroidism of renal origin: Secondary | ICD-10-CM | POA: Diagnosis not present

## 2017-04-25 DIAGNOSIS — N186 End stage renal disease: Secondary | ICD-10-CM | POA: Diagnosis not present

## 2017-04-25 DIAGNOSIS — D631 Anemia in chronic kidney disease: Secondary | ICD-10-CM | POA: Diagnosis not present

## 2017-04-25 DIAGNOSIS — Z992 Dependence on renal dialysis: Secondary | ICD-10-CM | POA: Diagnosis not present

## 2017-04-27 DIAGNOSIS — D509 Iron deficiency anemia, unspecified: Secondary | ICD-10-CM | POA: Diagnosis not present

## 2017-04-27 DIAGNOSIS — D631 Anemia in chronic kidney disease: Secondary | ICD-10-CM | POA: Diagnosis not present

## 2017-04-27 DIAGNOSIS — N2581 Secondary hyperparathyroidism of renal origin: Secondary | ICD-10-CM | POA: Diagnosis not present

## 2017-04-27 DIAGNOSIS — N186 End stage renal disease: Secondary | ICD-10-CM | POA: Diagnosis not present

## 2017-04-27 DIAGNOSIS — Z992 Dependence on renal dialysis: Secondary | ICD-10-CM | POA: Diagnosis not present

## 2017-04-28 DIAGNOSIS — E11319 Type 2 diabetes mellitus with unspecified diabetic retinopathy without macular edema: Secondary | ICD-10-CM | POA: Diagnosis not present

## 2017-04-28 DIAGNOSIS — M6281 Muscle weakness (generalized): Secondary | ICD-10-CM | POA: Diagnosis not present

## 2017-04-28 DIAGNOSIS — J449 Chronic obstructive pulmonary disease, unspecified: Secondary | ICD-10-CM | POA: Diagnosis not present

## 2017-04-28 DIAGNOSIS — H548 Legal blindness, as defined in USA: Secondary | ICD-10-CM | POA: Diagnosis not present

## 2017-04-28 DIAGNOSIS — Z87891 Personal history of nicotine dependence: Secondary | ICD-10-CM | POA: Diagnosis not present

## 2017-04-28 DIAGNOSIS — I739 Peripheral vascular disease, unspecified: Secondary | ICD-10-CM | POA: Diagnosis not present

## 2017-04-28 DIAGNOSIS — Z992 Dependence on renal dialysis: Secondary | ICD-10-CM | POA: Diagnosis not present

## 2017-04-28 DIAGNOSIS — M5136 Other intervertebral disc degeneration, lumbar region: Secondary | ICD-10-CM | POA: Diagnosis not present

## 2017-04-28 DIAGNOSIS — Z8781 Personal history of (healed) traumatic fracture: Secondary | ICD-10-CM | POA: Diagnosis not present

## 2017-04-28 DIAGNOSIS — Z7982 Long term (current) use of aspirin: Secondary | ICD-10-CM | POA: Diagnosis not present

## 2017-04-28 DIAGNOSIS — Z955 Presence of coronary angioplasty implant and graft: Secondary | ICD-10-CM | POA: Diagnosis not present

## 2017-04-28 DIAGNOSIS — E1122 Type 2 diabetes mellitus with diabetic chronic kidney disease: Secondary | ICD-10-CM | POA: Diagnosis not present

## 2017-04-28 DIAGNOSIS — Z9181 History of falling: Secondary | ICD-10-CM | POA: Diagnosis not present

## 2017-04-28 DIAGNOSIS — I69398 Other sequelae of cerebral infarction: Secondary | ICD-10-CM | POA: Diagnosis not present

## 2017-04-28 DIAGNOSIS — F0150 Vascular dementia without behavioral disturbance: Secondary | ICD-10-CM | POA: Diagnosis not present

## 2017-04-28 DIAGNOSIS — N186 End stage renal disease: Secondary | ICD-10-CM | POA: Diagnosis not present

## 2017-04-28 DIAGNOSIS — I2510 Atherosclerotic heart disease of native coronary artery without angina pectoris: Secondary | ICD-10-CM | POA: Diagnosis not present

## 2017-04-28 DIAGNOSIS — I4891 Unspecified atrial fibrillation: Secondary | ICD-10-CM | POA: Diagnosis not present

## 2017-04-30 DIAGNOSIS — Z992 Dependence on renal dialysis: Secondary | ICD-10-CM | POA: Diagnosis not present

## 2017-04-30 DIAGNOSIS — D509 Iron deficiency anemia, unspecified: Secondary | ICD-10-CM | POA: Diagnosis not present

## 2017-04-30 DIAGNOSIS — D631 Anemia in chronic kidney disease: Secondary | ICD-10-CM | POA: Diagnosis not present

## 2017-04-30 DIAGNOSIS — N2581 Secondary hyperparathyroidism of renal origin: Secondary | ICD-10-CM | POA: Diagnosis not present

## 2017-04-30 DIAGNOSIS — N186 End stage renal disease: Secondary | ICD-10-CM | POA: Diagnosis not present

## 2017-05-01 DIAGNOSIS — N186 End stage renal disease: Secondary | ICD-10-CM | POA: Diagnosis not present

## 2017-05-01 DIAGNOSIS — I4891 Unspecified atrial fibrillation: Secondary | ICD-10-CM | POA: Diagnosis not present

## 2017-05-01 DIAGNOSIS — I69398 Other sequelae of cerebral infarction: Secondary | ICD-10-CM | POA: Diagnosis not present

## 2017-05-01 DIAGNOSIS — F0150 Vascular dementia without behavioral disturbance: Secondary | ICD-10-CM | POA: Diagnosis not present

## 2017-05-01 DIAGNOSIS — M6281 Muscle weakness (generalized): Secondary | ICD-10-CM | POA: Diagnosis not present

## 2017-05-01 DIAGNOSIS — E1122 Type 2 diabetes mellitus with diabetic chronic kidney disease: Secondary | ICD-10-CM | POA: Diagnosis not present

## 2017-05-02 DIAGNOSIS — F0390 Unspecified dementia without behavioral disturbance: Secondary | ICD-10-CM | POA: Diagnosis not present

## 2017-05-02 DIAGNOSIS — I129 Hypertensive chronic kidney disease with stage 1 through stage 4 chronic kidney disease, or unspecified chronic kidney disease: Secondary | ICD-10-CM | POA: Diagnosis not present

## 2017-05-02 DIAGNOSIS — Z992 Dependence on renal dialysis: Secondary | ICD-10-CM | POA: Diagnosis not present

## 2017-05-02 DIAGNOSIS — N189 Chronic kidney disease, unspecified: Secondary | ICD-10-CM | POA: Diagnosis not present

## 2017-05-02 DIAGNOSIS — Z7689 Persons encountering health services in other specified circumstances: Secondary | ICD-10-CM | POA: Diagnosis not present

## 2017-05-02 DIAGNOSIS — E1122 Type 2 diabetes mellitus with diabetic chronic kidney disease: Secondary | ICD-10-CM | POA: Diagnosis not present

## 2017-05-02 DIAGNOSIS — Z043 Encounter for examination and observation following other accident: Secondary | ICD-10-CM | POA: Diagnosis not present

## 2017-05-02 DIAGNOSIS — Z87891 Personal history of nicotine dependence: Secondary | ICD-10-CM | POA: Diagnosis not present

## 2017-05-02 DIAGNOSIS — F4489 Other dissociative and conversion disorders: Secondary | ICD-10-CM | POA: Diagnosis not present

## 2017-05-03 DIAGNOSIS — D509 Iron deficiency anemia, unspecified: Secondary | ICD-10-CM | POA: Diagnosis not present

## 2017-05-03 DIAGNOSIS — D631 Anemia in chronic kidney disease: Secondary | ICD-10-CM | POA: Diagnosis not present

## 2017-05-03 DIAGNOSIS — Z992 Dependence on renal dialysis: Secondary | ICD-10-CM | POA: Diagnosis not present

## 2017-05-03 DIAGNOSIS — N2581 Secondary hyperparathyroidism of renal origin: Secondary | ICD-10-CM | POA: Diagnosis not present

## 2017-05-03 DIAGNOSIS — N186 End stage renal disease: Secondary | ICD-10-CM | POA: Diagnosis not present

## 2017-05-04 DIAGNOSIS — D631 Anemia in chronic kidney disease: Secondary | ICD-10-CM | POA: Diagnosis not present

## 2017-05-04 DIAGNOSIS — I4891 Unspecified atrial fibrillation: Secondary | ICD-10-CM | POA: Diagnosis not present

## 2017-05-04 DIAGNOSIS — N2581 Secondary hyperparathyroidism of renal origin: Secondary | ICD-10-CM | POA: Diagnosis not present

## 2017-05-04 DIAGNOSIS — F0150 Vascular dementia without behavioral disturbance: Secondary | ICD-10-CM | POA: Diagnosis not present

## 2017-05-04 DIAGNOSIS — D509 Iron deficiency anemia, unspecified: Secondary | ICD-10-CM | POA: Diagnosis not present

## 2017-05-04 DIAGNOSIS — Z992 Dependence on renal dialysis: Secondary | ICD-10-CM | POA: Diagnosis not present

## 2017-05-04 DIAGNOSIS — E1122 Type 2 diabetes mellitus with diabetic chronic kidney disease: Secondary | ICD-10-CM | POA: Diagnosis not present

## 2017-05-04 DIAGNOSIS — N186 End stage renal disease: Secondary | ICD-10-CM | POA: Diagnosis not present

## 2017-05-04 DIAGNOSIS — I69398 Other sequelae of cerebral infarction: Secondary | ICD-10-CM | POA: Diagnosis not present

## 2017-05-04 DIAGNOSIS — M6281 Muscle weakness (generalized): Secondary | ICD-10-CM | POA: Diagnosis not present

## 2017-05-07 DIAGNOSIS — I4891 Unspecified atrial fibrillation: Secondary | ICD-10-CM | POA: Diagnosis not present

## 2017-05-07 DIAGNOSIS — F0150 Vascular dementia without behavioral disturbance: Secondary | ICD-10-CM | POA: Diagnosis not present

## 2017-05-07 DIAGNOSIS — N186 End stage renal disease: Secondary | ICD-10-CM | POA: Diagnosis not present

## 2017-05-07 DIAGNOSIS — Z992 Dependence on renal dialysis: Secondary | ICD-10-CM | POA: Diagnosis not present

## 2017-05-07 DIAGNOSIS — E1122 Type 2 diabetes mellitus with diabetic chronic kidney disease: Secondary | ICD-10-CM | POA: Diagnosis not present

## 2017-05-07 DIAGNOSIS — D631 Anemia in chronic kidney disease: Secondary | ICD-10-CM | POA: Diagnosis not present

## 2017-05-07 DIAGNOSIS — D509 Iron deficiency anemia, unspecified: Secondary | ICD-10-CM | POA: Diagnosis not present

## 2017-05-07 DIAGNOSIS — I69398 Other sequelae of cerebral infarction: Secondary | ICD-10-CM | POA: Diagnosis not present

## 2017-05-07 DIAGNOSIS — N2581 Secondary hyperparathyroidism of renal origin: Secondary | ICD-10-CM | POA: Diagnosis not present

## 2017-05-07 DIAGNOSIS — M6281 Muscle weakness (generalized): Secondary | ICD-10-CM | POA: Diagnosis not present

## 2017-05-08 DIAGNOSIS — E1122 Type 2 diabetes mellitus with diabetic chronic kidney disease: Secondary | ICD-10-CM | POA: Diagnosis not present

## 2017-05-08 DIAGNOSIS — N186 End stage renal disease: Secondary | ICD-10-CM | POA: Diagnosis not present

## 2017-05-08 DIAGNOSIS — I69398 Other sequelae of cerebral infarction: Secondary | ICD-10-CM | POA: Diagnosis not present

## 2017-05-08 DIAGNOSIS — F0150 Vascular dementia without behavioral disturbance: Secondary | ICD-10-CM | POA: Diagnosis not present

## 2017-05-08 DIAGNOSIS — M6281 Muscle weakness (generalized): Secondary | ICD-10-CM | POA: Diagnosis not present

## 2017-05-08 DIAGNOSIS — I4891 Unspecified atrial fibrillation: Secondary | ICD-10-CM | POA: Diagnosis not present

## 2017-05-09 DIAGNOSIS — N186 End stage renal disease: Secondary | ICD-10-CM | POA: Diagnosis not present

## 2017-05-09 DIAGNOSIS — D509 Iron deficiency anemia, unspecified: Secondary | ICD-10-CM | POA: Diagnosis not present

## 2017-05-09 DIAGNOSIS — Z992 Dependence on renal dialysis: Secondary | ICD-10-CM | POA: Diagnosis not present

## 2017-05-09 DIAGNOSIS — N2581 Secondary hyperparathyroidism of renal origin: Secondary | ICD-10-CM | POA: Diagnosis not present

## 2017-05-09 DIAGNOSIS — D631 Anemia in chronic kidney disease: Secondary | ICD-10-CM | POA: Diagnosis not present

## 2017-05-10 DIAGNOSIS — M6281 Muscle weakness (generalized): Secondary | ICD-10-CM | POA: Diagnosis not present

## 2017-05-10 DIAGNOSIS — I69398 Other sequelae of cerebral infarction: Secondary | ICD-10-CM | POA: Diagnosis not present

## 2017-05-10 DIAGNOSIS — F0150 Vascular dementia without behavioral disturbance: Secondary | ICD-10-CM | POA: Diagnosis not present

## 2017-05-10 DIAGNOSIS — N186 End stage renal disease: Secondary | ICD-10-CM | POA: Diagnosis not present

## 2017-05-10 DIAGNOSIS — I4891 Unspecified atrial fibrillation: Secondary | ICD-10-CM | POA: Diagnosis not present

## 2017-05-10 DIAGNOSIS — E1122 Type 2 diabetes mellitus with diabetic chronic kidney disease: Secondary | ICD-10-CM | POA: Diagnosis not present

## 2017-05-11 DIAGNOSIS — F0150 Vascular dementia without behavioral disturbance: Secondary | ICD-10-CM | POA: Diagnosis not present

## 2017-05-11 DIAGNOSIS — E1122 Type 2 diabetes mellitus with diabetic chronic kidney disease: Secondary | ICD-10-CM | POA: Diagnosis not present

## 2017-05-11 DIAGNOSIS — I69398 Other sequelae of cerebral infarction: Secondary | ICD-10-CM | POA: Diagnosis not present

## 2017-05-11 DIAGNOSIS — M6281 Muscle weakness (generalized): Secondary | ICD-10-CM | POA: Diagnosis not present

## 2017-05-11 DIAGNOSIS — N186 End stage renal disease: Secondary | ICD-10-CM | POA: Diagnosis not present

## 2017-05-11 DIAGNOSIS — I4891 Unspecified atrial fibrillation: Secondary | ICD-10-CM | POA: Diagnosis not present

## 2017-05-12 DIAGNOSIS — R410 Disorientation, unspecified: Secondary | ICD-10-CM | POA: Diagnosis not present

## 2017-05-12 DIAGNOSIS — E43 Unspecified severe protein-calorie malnutrition: Secondary | ICD-10-CM | POA: Diagnosis not present

## 2017-05-12 DIAGNOSIS — F4489 Other dissociative and conversion disorders: Secondary | ICD-10-CM | POA: Diagnosis not present

## 2017-05-12 DIAGNOSIS — R4020 Unspecified coma: Secondary | ICD-10-CM | POA: Diagnosis not present

## 2017-05-12 DIAGNOSIS — R0989 Other specified symptoms and signs involving the circulatory and respiratory systems: Secondary | ICD-10-CM | POA: Diagnosis not present

## 2017-05-12 DIAGNOSIS — N189 Chronic kidney disease, unspecified: Secondary | ICD-10-CM | POA: Diagnosis not present

## 2017-05-12 DIAGNOSIS — I5031 Acute diastolic (congestive) heart failure: Secondary | ICD-10-CM | POA: Diagnosis not present

## 2017-05-12 DIAGNOSIS — R4182 Altered mental status, unspecified: Secondary | ICD-10-CM | POA: Diagnosis not present

## 2017-05-12 DIAGNOSIS — R064 Hyperventilation: Secondary | ICD-10-CM | POA: Diagnosis not present

## 2017-05-12 DIAGNOSIS — E440 Moderate protein-calorie malnutrition: Secondary | ICD-10-CM | POA: Diagnosis not present

## 2017-05-12 DIAGNOSIS — E1122 Type 2 diabetes mellitus with diabetic chronic kidney disease: Secondary | ICD-10-CM | POA: Diagnosis not present

## 2017-05-12 DIAGNOSIS — A0472 Enterocolitis due to Clostridium difficile, not specified as recurrent: Secondary | ICD-10-CM | POA: Diagnosis not present

## 2017-05-12 DIAGNOSIS — N186 End stage renal disease: Secondary | ICD-10-CM | POA: Diagnosis not present

## 2017-05-12 DIAGNOSIS — I120 Hypertensive chronic kidney disease with stage 5 chronic kidney disease or end stage renal disease: Secondary | ICD-10-CM | POA: Diagnosis not present

## 2017-05-14 DIAGNOSIS — E1129 Type 2 diabetes mellitus with other diabetic kidney complication: Secondary | ICD-10-CM | POA: Diagnosis not present

## 2017-05-14 DIAGNOSIS — R109 Unspecified abdominal pain: Secondary | ICD-10-CM | POA: Diagnosis not present

## 2017-05-14 DIAGNOSIS — I10 Essential (primary) hypertension: Secondary | ICD-10-CM | POA: Diagnosis not present

## 2017-05-14 DIAGNOSIS — N186 End stage renal disease: Secondary | ICD-10-CM | POA: Diagnosis not present

## 2017-05-14 DIAGNOSIS — R112 Nausea with vomiting, unspecified: Secondary | ICD-10-CM | POA: Diagnosis not present

## 2017-05-14 DIAGNOSIS — Z992 Dependence on renal dialysis: Secondary | ICD-10-CM | POA: Diagnosis not present

## 2017-05-15 DIAGNOSIS — M47816 Spondylosis without myelopathy or radiculopathy, lumbar region: Secondary | ICD-10-CM | POA: Diagnosis present

## 2017-05-15 DIAGNOSIS — Z992 Dependence on renal dialysis: Secondary | ICD-10-CM | POA: Diagnosis not present

## 2017-05-15 DIAGNOSIS — I482 Chronic atrial fibrillation: Secondary | ICD-10-CM | POA: Diagnosis present

## 2017-05-15 DIAGNOSIS — Z7982 Long term (current) use of aspirin: Secondary | ICD-10-CM | POA: Diagnosis not present

## 2017-05-15 DIAGNOSIS — E1165 Type 2 diabetes mellitus with hyperglycemia: Secondary | ICD-10-CM | POA: Diagnosis not present

## 2017-05-15 DIAGNOSIS — Z87891 Personal history of nicotine dependence: Secondary | ICD-10-CM | POA: Diagnosis not present

## 2017-05-15 DIAGNOSIS — D631 Anemia in chronic kidney disease: Secondary | ICD-10-CM | POA: Diagnosis not present

## 2017-05-15 DIAGNOSIS — M5135 Other intervertebral disc degeneration, thoracolumbar region: Secondary | ICD-10-CM | POA: Diagnosis not present

## 2017-05-15 DIAGNOSIS — R410 Disorientation, unspecified: Secondary | ICD-10-CM | POA: Diagnosis present

## 2017-05-15 DIAGNOSIS — R41841 Cognitive communication deficit: Secondary | ICD-10-CM | POA: Diagnosis not present

## 2017-05-15 DIAGNOSIS — E43 Unspecified severe protein-calorie malnutrition: Secondary | ICD-10-CM | POA: Diagnosis present

## 2017-05-15 DIAGNOSIS — F0150 Vascular dementia without behavioral disturbance: Secondary | ICD-10-CM | POA: Diagnosis present

## 2017-05-15 DIAGNOSIS — E1100 Type 2 diabetes mellitus with hyperosmolarity without nonketotic hyperglycemic-hyperosmolar coma (NKHHC): Secondary | ICD-10-CM | POA: Diagnosis not present

## 2017-05-15 DIAGNOSIS — E860 Dehydration: Secondary | ICD-10-CM | POA: Diagnosis not present

## 2017-05-15 DIAGNOSIS — I120 Hypertensive chronic kidney disease with stage 5 chronic kidney disease or end stage renal disease: Secondary | ICD-10-CM | POA: Diagnosis not present

## 2017-05-15 DIAGNOSIS — Z885 Allergy status to narcotic agent status: Secondary | ICD-10-CM | POA: Diagnosis not present

## 2017-05-15 DIAGNOSIS — R109 Unspecified abdominal pain: Secondary | ICD-10-CM | POA: Diagnosis not present

## 2017-05-15 DIAGNOSIS — Z794 Long term (current) use of insulin: Secondary | ICD-10-CM | POA: Diagnosis not present

## 2017-05-15 DIAGNOSIS — Z741 Need for assistance with personal care: Secondary | ICD-10-CM | POA: Diagnosis not present

## 2017-05-15 DIAGNOSIS — R2681 Unsteadiness on feet: Secondary | ICD-10-CM | POA: Diagnosis not present

## 2017-05-15 DIAGNOSIS — R262 Difficulty in walking, not elsewhere classified: Secondary | ICD-10-CM | POA: Diagnosis not present

## 2017-05-15 DIAGNOSIS — A0472 Enterocolitis due to Clostridium difficile, not specified as recurrent: Secondary | ICD-10-CM | POA: Diagnosis not present

## 2017-05-15 DIAGNOSIS — E785 Hyperlipidemia, unspecified: Secondary | ICD-10-CM | POA: Diagnosis not present

## 2017-05-15 DIAGNOSIS — R1312 Dysphagia, oropharyngeal phase: Secondary | ICD-10-CM | POA: Diagnosis not present

## 2017-05-15 DIAGNOSIS — N186 End stage renal disease: Secondary | ICD-10-CM | POA: Diagnosis not present

## 2017-05-15 DIAGNOSIS — Z66 Do not resuscitate: Secondary | ICD-10-CM | POA: Diagnosis present

## 2017-05-15 DIAGNOSIS — R279 Unspecified lack of coordination: Secondary | ICD-10-CM | POA: Diagnosis not present

## 2017-05-15 DIAGNOSIS — Z8673 Personal history of transient ischemic attack (TIA), and cerebral infarction without residual deficits: Secondary | ICD-10-CM | POA: Diagnosis not present

## 2017-05-15 DIAGNOSIS — I5031 Acute diastolic (congestive) heart failure: Secondary | ICD-10-CM | POA: Diagnosis not present

## 2017-05-15 DIAGNOSIS — R4182 Altered mental status, unspecified: Secondary | ICD-10-CM | POA: Diagnosis not present

## 2017-05-15 DIAGNOSIS — I2510 Atherosclerotic heart disease of native coronary artery without angina pectoris: Secondary | ICD-10-CM | POA: Diagnosis present

## 2017-05-15 DIAGNOSIS — Z880 Allergy status to penicillin: Secondary | ICD-10-CM | POA: Diagnosis not present

## 2017-05-15 DIAGNOSIS — R531 Weakness: Secondary | ICD-10-CM | POA: Diagnosis present

## 2017-05-15 DIAGNOSIS — Z7401 Bed confinement status: Secondary | ICD-10-CM | POA: Diagnosis not present

## 2017-05-15 DIAGNOSIS — Z79899 Other long term (current) drug therapy: Secondary | ICD-10-CM | POA: Diagnosis not present

## 2017-05-15 DIAGNOSIS — F339 Major depressive disorder, recurrent, unspecified: Secondary | ICD-10-CM | POA: Diagnosis not present

## 2017-05-15 DIAGNOSIS — E1122 Type 2 diabetes mellitus with diabetic chronic kidney disease: Secondary | ICD-10-CM | POA: Diagnosis present

## 2017-05-15 DIAGNOSIS — M6281 Muscle weakness (generalized): Secondary | ICD-10-CM | POA: Diagnosis not present

## 2017-05-15 DIAGNOSIS — R296 Repeated falls: Secondary | ICD-10-CM | POA: Diagnosis not present

## 2017-05-15 DIAGNOSIS — D649 Anemia, unspecified: Secondary | ICD-10-CM | POA: Diagnosis present

## 2017-05-15 DIAGNOSIS — R112 Nausea with vomiting, unspecified: Secondary | ICD-10-CM | POA: Diagnosis not present

## 2017-05-15 DIAGNOSIS — E440 Moderate protein-calorie malnutrition: Secondary | ICD-10-CM | POA: Diagnosis not present

## 2017-05-15 DIAGNOSIS — Z9181 History of falling: Secondary | ICD-10-CM | POA: Diagnosis not present

## 2017-05-18 DIAGNOSIS — R262 Difficulty in walking, not elsewhere classified: Secondary | ICD-10-CM | POA: Diagnosis not present

## 2017-05-18 DIAGNOSIS — R279 Unspecified lack of coordination: Secondary | ICD-10-CM | POA: Diagnosis not present

## 2017-05-18 DIAGNOSIS — R296 Repeated falls: Secondary | ICD-10-CM | POA: Diagnosis not present

## 2017-05-18 DIAGNOSIS — R4182 Altered mental status, unspecified: Secondary | ICD-10-CM | POA: Diagnosis not present

## 2017-05-18 DIAGNOSIS — N186 End stage renal disease: Secondary | ICD-10-CM | POA: Diagnosis not present

## 2017-05-18 DIAGNOSIS — I4891 Unspecified atrial fibrillation: Secondary | ICD-10-CM | POA: Diagnosis not present

## 2017-05-18 DIAGNOSIS — D509 Iron deficiency anemia, unspecified: Secondary | ICD-10-CM | POA: Diagnosis not present

## 2017-05-18 DIAGNOSIS — M6281 Muscle weakness (generalized): Secondary | ICD-10-CM | POA: Diagnosis not present

## 2017-05-18 DIAGNOSIS — R410 Disorientation, unspecified: Secondary | ICD-10-CM | POA: Diagnosis not present

## 2017-05-18 DIAGNOSIS — A0472 Enterocolitis due to Clostridium difficile, not specified as recurrent: Secondary | ICD-10-CM | POA: Diagnosis not present

## 2017-05-18 DIAGNOSIS — E1122 Type 2 diabetes mellitus with diabetic chronic kidney disease: Secondary | ICD-10-CM | POA: Diagnosis not present

## 2017-05-18 DIAGNOSIS — I2510 Atherosclerotic heart disease of native coronary artery without angina pectoris: Secondary | ICD-10-CM | POA: Diagnosis not present

## 2017-05-18 DIAGNOSIS — E440 Moderate protein-calorie malnutrition: Secondary | ICD-10-CM | POA: Diagnosis not present

## 2017-05-18 DIAGNOSIS — N2581 Secondary hyperparathyroidism of renal origin: Secondary | ICD-10-CM | POA: Diagnosis not present

## 2017-05-18 DIAGNOSIS — D631 Anemia in chronic kidney disease: Secondary | ICD-10-CM | POA: Diagnosis not present

## 2017-05-18 DIAGNOSIS — E1165 Type 2 diabetes mellitus with hyperglycemia: Secondary | ICD-10-CM | POA: Diagnosis not present

## 2017-05-18 DIAGNOSIS — Z8673 Personal history of transient ischemic attack (TIA), and cerebral infarction without residual deficits: Secondary | ICD-10-CM | POA: Diagnosis not present

## 2017-05-18 DIAGNOSIS — M5135 Other intervertebral disc degeneration, thoracolumbar region: Secondary | ICD-10-CM | POA: Diagnosis not present

## 2017-05-18 DIAGNOSIS — Z992 Dependence on renal dialysis: Secondary | ICD-10-CM | POA: Diagnosis not present

## 2017-05-18 DIAGNOSIS — E860 Dehydration: Secondary | ICD-10-CM | POA: Diagnosis not present

## 2017-05-18 DIAGNOSIS — R2681 Unsteadiness on feet: Secondary | ICD-10-CM | POA: Diagnosis not present

## 2017-05-18 DIAGNOSIS — E1100 Type 2 diabetes mellitus with hyperosmolarity without nonketotic hyperglycemic-hyperosmolar coma (NKHHC): Secondary | ICD-10-CM | POA: Diagnosis not present

## 2017-05-18 DIAGNOSIS — R41841 Cognitive communication deficit: Secondary | ICD-10-CM | POA: Diagnosis not present

## 2017-05-18 DIAGNOSIS — R112 Nausea with vomiting, unspecified: Secondary | ICD-10-CM | POA: Diagnosis not present

## 2017-05-18 DIAGNOSIS — E785 Hyperlipidemia, unspecified: Secondary | ICD-10-CM | POA: Diagnosis not present

## 2017-05-18 DIAGNOSIS — F339 Major depressive disorder, recurrent, unspecified: Secondary | ICD-10-CM | POA: Diagnosis not present

## 2017-05-18 DIAGNOSIS — F0150 Vascular dementia without behavioral disturbance: Secondary | ICD-10-CM | POA: Diagnosis not present

## 2017-05-18 DIAGNOSIS — R109 Unspecified abdominal pain: Secondary | ICD-10-CM | POA: Diagnosis not present

## 2017-05-18 DIAGNOSIS — Z741 Need for assistance with personal care: Secondary | ICD-10-CM | POA: Diagnosis not present

## 2017-05-18 DIAGNOSIS — R197 Diarrhea, unspecified: Secondary | ICD-10-CM | POA: Diagnosis not present

## 2017-05-18 DIAGNOSIS — Z7401 Bed confinement status: Secondary | ICD-10-CM | POA: Diagnosis not present

## 2017-05-18 DIAGNOSIS — I5031 Acute diastolic (congestive) heart failure: Secondary | ICD-10-CM | POA: Diagnosis not present

## 2017-05-18 DIAGNOSIS — R1312 Dysphagia, oropharyngeal phase: Secondary | ICD-10-CM | POA: Diagnosis not present

## 2017-05-18 DIAGNOSIS — E43 Unspecified severe protein-calorie malnutrition: Secondary | ICD-10-CM | POA: Diagnosis not present

## 2017-05-18 DIAGNOSIS — I120 Hypertensive chronic kidney disease with stage 5 chronic kidney disease or end stage renal disease: Secondary | ICD-10-CM | POA: Diagnosis not present

## 2017-05-18 DIAGNOSIS — I69398 Other sequelae of cerebral infarction: Secondary | ICD-10-CM | POA: Diagnosis not present

## 2017-05-21 DIAGNOSIS — D509 Iron deficiency anemia, unspecified: Secondary | ICD-10-CM | POA: Diagnosis not present

## 2017-05-21 DIAGNOSIS — N186 End stage renal disease: Secondary | ICD-10-CM | POA: Diagnosis not present

## 2017-05-21 DIAGNOSIS — D631 Anemia in chronic kidney disease: Secondary | ICD-10-CM | POA: Diagnosis not present

## 2017-05-21 DIAGNOSIS — N2581 Secondary hyperparathyroidism of renal origin: Secondary | ICD-10-CM | POA: Diagnosis not present

## 2017-05-21 DIAGNOSIS — Z992 Dependence on renal dialysis: Secondary | ICD-10-CM | POA: Diagnosis not present

## 2017-05-22 DIAGNOSIS — R197 Diarrhea, unspecified: Secondary | ICD-10-CM | POA: Diagnosis not present

## 2017-05-22 DIAGNOSIS — N186 End stage renal disease: Secondary | ICD-10-CM | POA: Diagnosis not present

## 2017-05-22 DIAGNOSIS — Z992 Dependence on renal dialysis: Secondary | ICD-10-CM | POA: Diagnosis not present

## 2017-05-22 DIAGNOSIS — E1165 Type 2 diabetes mellitus with hyperglycemia: Secondary | ICD-10-CM | POA: Diagnosis not present

## 2017-05-22 DIAGNOSIS — I2510 Atherosclerotic heart disease of native coronary artery without angina pectoris: Secondary | ICD-10-CM | POA: Diagnosis not present

## 2017-05-23 DIAGNOSIS — N2581 Secondary hyperparathyroidism of renal origin: Secondary | ICD-10-CM | POA: Diagnosis not present

## 2017-05-23 DIAGNOSIS — N186 End stage renal disease: Secondary | ICD-10-CM | POA: Diagnosis not present

## 2017-05-23 DIAGNOSIS — I5031 Acute diastolic (congestive) heart failure: Secondary | ICD-10-CM | POA: Diagnosis not present

## 2017-05-23 DIAGNOSIS — I2510 Atherosclerotic heart disease of native coronary artery without angina pectoris: Secondary | ICD-10-CM | POA: Diagnosis not present

## 2017-05-23 DIAGNOSIS — D631 Anemia in chronic kidney disease: Secondary | ICD-10-CM | POA: Diagnosis not present

## 2017-05-23 DIAGNOSIS — D509 Iron deficiency anemia, unspecified: Secondary | ICD-10-CM | POA: Diagnosis not present

## 2017-05-23 DIAGNOSIS — Z992 Dependence on renal dialysis: Secondary | ICD-10-CM | POA: Diagnosis not present

## 2017-05-23 DIAGNOSIS — E1165 Type 2 diabetes mellitus with hyperglycemia: Secondary | ICD-10-CM | POA: Diagnosis not present

## 2017-05-25 DIAGNOSIS — D509 Iron deficiency anemia, unspecified: Secondary | ICD-10-CM | POA: Diagnosis not present

## 2017-05-25 DIAGNOSIS — D631 Anemia in chronic kidney disease: Secondary | ICD-10-CM | POA: Diagnosis not present

## 2017-05-25 DIAGNOSIS — N186 End stage renal disease: Secondary | ICD-10-CM | POA: Diagnosis not present

## 2017-05-25 DIAGNOSIS — N2581 Secondary hyperparathyroidism of renal origin: Secondary | ICD-10-CM | POA: Diagnosis not present

## 2017-05-25 DIAGNOSIS — Z992 Dependence on renal dialysis: Secondary | ICD-10-CM | POA: Diagnosis not present

## 2017-05-28 DIAGNOSIS — D631 Anemia in chronic kidney disease: Secondary | ICD-10-CM | POA: Diagnosis not present

## 2017-05-28 DIAGNOSIS — N186 End stage renal disease: Secondary | ICD-10-CM | POA: Diagnosis not present

## 2017-05-28 DIAGNOSIS — N2581 Secondary hyperparathyroidism of renal origin: Secondary | ICD-10-CM | POA: Diagnosis not present

## 2017-05-28 DIAGNOSIS — D509 Iron deficiency anemia, unspecified: Secondary | ICD-10-CM | POA: Diagnosis not present

## 2017-05-28 DIAGNOSIS — A0472 Enterocolitis due to Clostridium difficile, not specified as recurrent: Secondary | ICD-10-CM | POA: Diagnosis not present

## 2017-05-28 DIAGNOSIS — Z992 Dependence on renal dialysis: Secondary | ICD-10-CM | POA: Diagnosis not present

## 2017-05-28 DIAGNOSIS — I5031 Acute diastolic (congestive) heart failure: Secondary | ICD-10-CM | POA: Diagnosis not present

## 2017-05-30 DIAGNOSIS — N186 End stage renal disease: Secondary | ICD-10-CM | POA: Diagnosis not present

## 2017-05-30 DIAGNOSIS — D631 Anemia in chronic kidney disease: Secondary | ICD-10-CM | POA: Diagnosis not present

## 2017-05-30 DIAGNOSIS — D509 Iron deficiency anemia, unspecified: Secondary | ICD-10-CM | POA: Diagnosis not present

## 2017-05-30 DIAGNOSIS — N2581 Secondary hyperparathyroidism of renal origin: Secondary | ICD-10-CM | POA: Diagnosis not present

## 2017-05-30 DIAGNOSIS — Z992 Dependence on renal dialysis: Secondary | ICD-10-CM | POA: Diagnosis not present

## 2017-06-01 DIAGNOSIS — N2581 Secondary hyperparathyroidism of renal origin: Secondary | ICD-10-CM | POA: Diagnosis not present

## 2017-06-01 DIAGNOSIS — N186 End stage renal disease: Secondary | ICD-10-CM | POA: Diagnosis not present

## 2017-06-01 DIAGNOSIS — D631 Anemia in chronic kidney disease: Secondary | ICD-10-CM | POA: Diagnosis not present

## 2017-06-01 DIAGNOSIS — Z992 Dependence on renal dialysis: Secondary | ICD-10-CM | POA: Diagnosis not present

## 2017-06-01 DIAGNOSIS — D509 Iron deficiency anemia, unspecified: Secondary | ICD-10-CM | POA: Diagnosis not present

## 2017-06-04 DIAGNOSIS — E1165 Type 2 diabetes mellitus with hyperglycemia: Secondary | ICD-10-CM | POA: Diagnosis not present

## 2017-06-04 DIAGNOSIS — M5135 Other intervertebral disc degeneration, thoracolumbar region: Secondary | ICD-10-CM | POA: Diagnosis not present

## 2017-06-04 DIAGNOSIS — N2581 Secondary hyperparathyroidism of renal origin: Secondary | ICD-10-CM | POA: Diagnosis not present

## 2017-06-04 DIAGNOSIS — N186 End stage renal disease: Secondary | ICD-10-CM | POA: Diagnosis not present

## 2017-06-04 DIAGNOSIS — D509 Iron deficiency anemia, unspecified: Secondary | ICD-10-CM | POA: Diagnosis not present

## 2017-06-04 DIAGNOSIS — D631 Anemia in chronic kidney disease: Secondary | ICD-10-CM | POA: Diagnosis not present

## 2017-06-04 DIAGNOSIS — I2510 Atherosclerotic heart disease of native coronary artery without angina pectoris: Secondary | ICD-10-CM | POA: Diagnosis not present

## 2017-06-04 DIAGNOSIS — Z992 Dependence on renal dialysis: Secondary | ICD-10-CM | POA: Diagnosis not present

## 2017-06-06 DIAGNOSIS — N2581 Secondary hyperparathyroidism of renal origin: Secondary | ICD-10-CM | POA: Diagnosis not present

## 2017-06-06 DIAGNOSIS — Z992 Dependence on renal dialysis: Secondary | ICD-10-CM | POA: Diagnosis not present

## 2017-06-06 DIAGNOSIS — N186 End stage renal disease: Secondary | ICD-10-CM | POA: Diagnosis not present

## 2017-06-06 DIAGNOSIS — D631 Anemia in chronic kidney disease: Secondary | ICD-10-CM | POA: Diagnosis not present

## 2017-06-06 DIAGNOSIS — D509 Iron deficiency anemia, unspecified: Secondary | ICD-10-CM | POA: Diagnosis not present

## 2017-06-07 DIAGNOSIS — I4891 Unspecified atrial fibrillation: Secondary | ICD-10-CM | POA: Diagnosis not present

## 2017-06-07 DIAGNOSIS — N186 End stage renal disease: Secondary | ICD-10-CM | POA: Diagnosis not present

## 2017-06-07 DIAGNOSIS — E1122 Type 2 diabetes mellitus with diabetic chronic kidney disease: Secondary | ICD-10-CM | POA: Diagnosis not present

## 2017-06-07 DIAGNOSIS — M6281 Muscle weakness (generalized): Secondary | ICD-10-CM | POA: Diagnosis not present

## 2017-06-07 DIAGNOSIS — F0150 Vascular dementia without behavioral disturbance: Secondary | ICD-10-CM | POA: Diagnosis not present

## 2017-06-07 DIAGNOSIS — I69398 Other sequelae of cerebral infarction: Secondary | ICD-10-CM | POA: Diagnosis not present

## 2017-06-08 DIAGNOSIS — N186 End stage renal disease: Secondary | ICD-10-CM | POA: Diagnosis not present

## 2017-06-08 DIAGNOSIS — Z992 Dependence on renal dialysis: Secondary | ICD-10-CM | POA: Diagnosis not present

## 2017-06-08 DIAGNOSIS — N2581 Secondary hyperparathyroidism of renal origin: Secondary | ICD-10-CM | POA: Diagnosis not present

## 2017-06-08 DIAGNOSIS — D509 Iron deficiency anemia, unspecified: Secondary | ICD-10-CM | POA: Diagnosis not present

## 2017-06-08 DIAGNOSIS — D631 Anemia in chronic kidney disease: Secondary | ICD-10-CM | POA: Diagnosis not present

## 2017-06-11 DIAGNOSIS — N186 End stage renal disease: Secondary | ICD-10-CM | POA: Diagnosis not present

## 2017-06-11 DIAGNOSIS — D509 Iron deficiency anemia, unspecified: Secondary | ICD-10-CM | POA: Diagnosis not present

## 2017-06-11 DIAGNOSIS — D631 Anemia in chronic kidney disease: Secondary | ICD-10-CM | POA: Diagnosis not present

## 2017-06-11 DIAGNOSIS — Z992 Dependence on renal dialysis: Secondary | ICD-10-CM | POA: Diagnosis not present

## 2017-06-11 DIAGNOSIS — N2581 Secondary hyperparathyroidism of renal origin: Secondary | ICD-10-CM | POA: Diagnosis not present

## 2017-06-12 DIAGNOSIS — E1122 Type 2 diabetes mellitus with diabetic chronic kidney disease: Secondary | ICD-10-CM | POA: Diagnosis not present

## 2017-06-12 DIAGNOSIS — M6281 Muscle weakness (generalized): Secondary | ICD-10-CM | POA: Diagnosis not present

## 2017-06-12 DIAGNOSIS — I69398 Other sequelae of cerebral infarction: Secondary | ICD-10-CM | POA: Diagnosis not present

## 2017-06-12 DIAGNOSIS — F0150 Vascular dementia without behavioral disturbance: Secondary | ICD-10-CM | POA: Diagnosis not present

## 2017-06-12 DIAGNOSIS — I4891 Unspecified atrial fibrillation: Secondary | ICD-10-CM | POA: Diagnosis not present

## 2017-06-12 DIAGNOSIS — N186 End stage renal disease: Secondary | ICD-10-CM | POA: Diagnosis not present

## 2017-06-13 DIAGNOSIS — N186 End stage renal disease: Secondary | ICD-10-CM | POA: Diagnosis not present

## 2017-06-13 DIAGNOSIS — D509 Iron deficiency anemia, unspecified: Secondary | ICD-10-CM | POA: Diagnosis not present

## 2017-06-13 DIAGNOSIS — D631 Anemia in chronic kidney disease: Secondary | ICD-10-CM | POA: Diagnosis not present

## 2017-06-13 DIAGNOSIS — N2581 Secondary hyperparathyroidism of renal origin: Secondary | ICD-10-CM | POA: Diagnosis not present

## 2017-06-13 DIAGNOSIS — Z992 Dependence on renal dialysis: Secondary | ICD-10-CM | POA: Diagnosis not present

## 2017-06-14 DIAGNOSIS — I4891 Unspecified atrial fibrillation: Secondary | ICD-10-CM | POA: Diagnosis not present

## 2017-06-14 DIAGNOSIS — F0150 Vascular dementia without behavioral disturbance: Secondary | ICD-10-CM | POA: Diagnosis not present

## 2017-06-14 DIAGNOSIS — M6281 Muscle weakness (generalized): Secondary | ICD-10-CM | POA: Diagnosis not present

## 2017-06-14 DIAGNOSIS — N186 End stage renal disease: Secondary | ICD-10-CM | POA: Diagnosis not present

## 2017-06-14 DIAGNOSIS — E1122 Type 2 diabetes mellitus with diabetic chronic kidney disease: Secondary | ICD-10-CM | POA: Diagnosis not present

## 2017-06-14 DIAGNOSIS — I69398 Other sequelae of cerebral infarction: Secondary | ICD-10-CM | POA: Diagnosis not present

## 2017-06-15 DIAGNOSIS — D631 Anemia in chronic kidney disease: Secondary | ICD-10-CM | POA: Diagnosis not present

## 2017-06-15 DIAGNOSIS — N2581 Secondary hyperparathyroidism of renal origin: Secondary | ICD-10-CM | POA: Diagnosis not present

## 2017-06-15 DIAGNOSIS — Z992 Dependence on renal dialysis: Secondary | ICD-10-CM | POA: Diagnosis not present

## 2017-06-15 DIAGNOSIS — D509 Iron deficiency anemia, unspecified: Secondary | ICD-10-CM | POA: Diagnosis not present

## 2017-06-15 DIAGNOSIS — N186 End stage renal disease: Secondary | ICD-10-CM | POA: Diagnosis not present

## 2017-06-18 DIAGNOSIS — D509 Iron deficiency anemia, unspecified: Secondary | ICD-10-CM | POA: Diagnosis not present

## 2017-06-18 DIAGNOSIS — D631 Anemia in chronic kidney disease: Secondary | ICD-10-CM | POA: Diagnosis not present

## 2017-06-18 DIAGNOSIS — N186 End stage renal disease: Secondary | ICD-10-CM | POA: Diagnosis not present

## 2017-06-18 DIAGNOSIS — Z992 Dependence on renal dialysis: Secondary | ICD-10-CM | POA: Diagnosis not present

## 2017-06-18 DIAGNOSIS — N2581 Secondary hyperparathyroidism of renal origin: Secondary | ICD-10-CM | POA: Diagnosis not present

## 2017-06-19 DIAGNOSIS — F0150 Vascular dementia without behavioral disturbance: Secondary | ICD-10-CM | POA: Diagnosis not present

## 2017-06-19 DIAGNOSIS — I69398 Other sequelae of cerebral infarction: Secondary | ICD-10-CM | POA: Diagnosis not present

## 2017-06-19 DIAGNOSIS — I4891 Unspecified atrial fibrillation: Secondary | ICD-10-CM | POA: Diagnosis not present

## 2017-06-19 DIAGNOSIS — N186 End stage renal disease: Secondary | ICD-10-CM | POA: Diagnosis not present

## 2017-06-19 DIAGNOSIS — M6281 Muscle weakness (generalized): Secondary | ICD-10-CM | POA: Diagnosis not present

## 2017-06-19 DIAGNOSIS — E1122 Type 2 diabetes mellitus with diabetic chronic kidney disease: Secondary | ICD-10-CM | POA: Diagnosis not present

## 2017-06-20 DIAGNOSIS — D631 Anemia in chronic kidney disease: Secondary | ICD-10-CM | POA: Diagnosis not present

## 2017-06-20 DIAGNOSIS — Z992 Dependence on renal dialysis: Secondary | ICD-10-CM | POA: Diagnosis not present

## 2017-06-20 DIAGNOSIS — N186 End stage renal disease: Secondary | ICD-10-CM | POA: Diagnosis not present

## 2017-06-20 DIAGNOSIS — N2581 Secondary hyperparathyroidism of renal origin: Secondary | ICD-10-CM | POA: Diagnosis not present

## 2017-06-20 DIAGNOSIS — D509 Iron deficiency anemia, unspecified: Secondary | ICD-10-CM | POA: Diagnosis not present

## 2017-06-21 DIAGNOSIS — F0280 Dementia in other diseases classified elsewhere without behavioral disturbance: Secondary | ICD-10-CM | POA: Diagnosis not present

## 2017-06-21 DIAGNOSIS — I4891 Unspecified atrial fibrillation: Secondary | ICD-10-CM | POA: Diagnosis not present

## 2017-06-21 DIAGNOSIS — I69398 Other sequelae of cerebral infarction: Secondary | ICD-10-CM | POA: Diagnosis not present

## 2017-06-21 DIAGNOSIS — E1121 Type 2 diabetes mellitus with diabetic nephropathy: Secondary | ICD-10-CM | POA: Diagnosis not present

## 2017-06-21 DIAGNOSIS — M6281 Muscle weakness (generalized): Secondary | ICD-10-CM | POA: Diagnosis not present

## 2017-06-21 DIAGNOSIS — E1122 Type 2 diabetes mellitus with diabetic chronic kidney disease: Secondary | ICD-10-CM | POA: Diagnosis not present

## 2017-06-21 DIAGNOSIS — N186 End stage renal disease: Secondary | ICD-10-CM | POA: Diagnosis not present

## 2017-06-21 DIAGNOSIS — F0150 Vascular dementia without behavioral disturbance: Secondary | ICD-10-CM | POA: Diagnosis not present

## 2017-06-21 DIAGNOSIS — I10 Essential (primary) hypertension: Secondary | ICD-10-CM | POA: Diagnosis not present

## 2017-06-22 DIAGNOSIS — D631 Anemia in chronic kidney disease: Secondary | ICD-10-CM | POA: Diagnosis not present

## 2017-06-22 DIAGNOSIS — Z8673 Personal history of transient ischemic attack (TIA), and cerebral infarction without residual deficits: Secondary | ICD-10-CM | POA: Diagnosis not present

## 2017-06-22 DIAGNOSIS — M47816 Spondylosis without myelopathy or radiculopathy, lumbar region: Secondary | ICD-10-CM | POA: Diagnosis present

## 2017-06-22 DIAGNOSIS — A0472 Enterocolitis due to Clostridium difficile, not specified as recurrent: Secondary | ICD-10-CM | POA: Diagnosis not present

## 2017-06-22 DIAGNOSIS — Z880 Allergy status to penicillin: Secondary | ICD-10-CM | POA: Diagnosis not present

## 2017-06-22 DIAGNOSIS — Z992 Dependence on renal dialysis: Secondary | ICD-10-CM | POA: Diagnosis not present

## 2017-06-22 DIAGNOSIS — D649 Anemia, unspecified: Secondary | ICD-10-CM | POA: Diagnosis present

## 2017-06-22 DIAGNOSIS — E876 Hypokalemia: Secondary | ICD-10-CM | POA: Diagnosis present

## 2017-06-22 DIAGNOSIS — E1122 Type 2 diabetes mellitus with diabetic chronic kidney disease: Secondary | ICD-10-CM | POA: Diagnosis present

## 2017-06-22 DIAGNOSIS — F0150 Vascular dementia without behavioral disturbance: Secondary | ICD-10-CM | POA: Diagnosis present

## 2017-06-22 DIAGNOSIS — Z955 Presence of coronary angioplasty implant and graft: Secondary | ICD-10-CM | POA: Diagnosis not present

## 2017-06-22 DIAGNOSIS — Z794 Long term (current) use of insulin: Secondary | ICD-10-CM | POA: Diagnosis not present

## 2017-06-22 DIAGNOSIS — T8612 Kidney transplant failure: Secondary | ICD-10-CM | POA: Diagnosis present

## 2017-06-22 DIAGNOSIS — I132 Hypertensive heart and chronic kidney disease with heart failure and with stage 5 chronic kidney disease, or end stage renal disease: Secondary | ICD-10-CM | POA: Diagnosis not present

## 2017-06-22 DIAGNOSIS — M6281 Muscle weakness (generalized): Secondary | ICD-10-CM | POA: Diagnosis not present

## 2017-06-22 DIAGNOSIS — D509 Iron deficiency anemia, unspecified: Secondary | ICD-10-CM | POA: Diagnosis not present

## 2017-06-22 DIAGNOSIS — I2510 Atherosclerotic heart disease of native coronary artery without angina pectoris: Secondary | ICD-10-CM | POA: Diagnosis present

## 2017-06-22 DIAGNOSIS — I5032 Chronic diastolic (congestive) heart failure: Secondary | ICD-10-CM | POA: Diagnosis not present

## 2017-06-22 DIAGNOSIS — I69398 Other sequelae of cerebral infarction: Secondary | ICD-10-CM | POA: Diagnosis not present

## 2017-06-22 DIAGNOSIS — R197 Diarrhea, unspecified: Secondary | ICD-10-CM | POA: Diagnosis not present

## 2017-06-22 DIAGNOSIS — R0602 Shortness of breath: Secondary | ICD-10-CM | POA: Diagnosis not present

## 2017-06-22 DIAGNOSIS — N2581 Secondary hyperparathyroidism of renal origin: Secondary | ICD-10-CM | POA: Diagnosis not present

## 2017-06-22 DIAGNOSIS — I482 Chronic atrial fibrillation: Secondary | ICD-10-CM | POA: Diagnosis present

## 2017-06-22 DIAGNOSIS — Z87891 Personal history of nicotine dependence: Secondary | ICD-10-CM | POA: Diagnosis not present

## 2017-06-22 DIAGNOSIS — E440 Moderate protein-calorie malnutrition: Secondary | ICD-10-CM | POA: Diagnosis present

## 2017-06-22 DIAGNOSIS — N186 End stage renal disease: Secondary | ICD-10-CM | POA: Diagnosis not present

## 2017-06-22 DIAGNOSIS — I4891 Unspecified atrial fibrillation: Secondary | ICD-10-CM | POA: Diagnosis not present

## 2017-06-22 DIAGNOSIS — E860 Dehydration: Secondary | ICD-10-CM | POA: Diagnosis present

## 2017-06-22 DIAGNOSIS — E1151 Type 2 diabetes mellitus with diabetic peripheral angiopathy without gangrene: Secondary | ICD-10-CM | POA: Diagnosis present

## 2017-06-26 DIAGNOSIS — I4891 Unspecified atrial fibrillation: Secondary | ICD-10-CM | POA: Diagnosis not present

## 2017-06-26 DIAGNOSIS — E1122 Type 2 diabetes mellitus with diabetic chronic kidney disease: Secondary | ICD-10-CM | POA: Diagnosis not present

## 2017-06-26 DIAGNOSIS — M6281 Muscle weakness (generalized): Secondary | ICD-10-CM | POA: Diagnosis not present

## 2017-06-26 DIAGNOSIS — N186 End stage renal disease: Secondary | ICD-10-CM | POA: Diagnosis not present

## 2017-06-26 DIAGNOSIS — F0150 Vascular dementia without behavioral disturbance: Secondary | ICD-10-CM | POA: Diagnosis not present

## 2017-06-26 DIAGNOSIS — I69398 Other sequelae of cerebral infarction: Secondary | ICD-10-CM | POA: Diagnosis not present

## 2017-06-27 DIAGNOSIS — J449 Chronic obstructive pulmonary disease, unspecified: Secondary | ICD-10-CM | POA: Diagnosis not present

## 2017-06-27 DIAGNOSIS — Z87891 Personal history of nicotine dependence: Secondary | ICD-10-CM | POA: Diagnosis not present

## 2017-06-27 DIAGNOSIS — Z955 Presence of coronary angioplasty implant and graft: Secondary | ICD-10-CM | POA: Diagnosis not present

## 2017-06-27 DIAGNOSIS — Z7982 Long term (current) use of aspirin: Secondary | ICD-10-CM | POA: Diagnosis not present

## 2017-06-27 DIAGNOSIS — I482 Chronic atrial fibrillation: Secondary | ICD-10-CM | POA: Diagnosis not present

## 2017-06-27 DIAGNOSIS — D631 Anemia in chronic kidney disease: Secondary | ICD-10-CM | POA: Diagnosis not present

## 2017-06-27 DIAGNOSIS — N186 End stage renal disease: Secondary | ICD-10-CM | POA: Diagnosis not present

## 2017-06-27 DIAGNOSIS — I5032 Chronic diastolic (congestive) heart failure: Secondary | ICD-10-CM | POA: Diagnosis not present

## 2017-06-27 DIAGNOSIS — E1122 Type 2 diabetes mellitus with diabetic chronic kidney disease: Secondary | ICD-10-CM | POA: Diagnosis not present

## 2017-06-27 DIAGNOSIS — A0472 Enterocolitis due to Clostridium difficile, not specified as recurrent: Secondary | ICD-10-CM | POA: Diagnosis not present

## 2017-06-27 DIAGNOSIS — Z9181 History of falling: Secondary | ICD-10-CM | POA: Diagnosis not present

## 2017-06-27 DIAGNOSIS — I739 Peripheral vascular disease, unspecified: Secondary | ICD-10-CM | POA: Diagnosis not present

## 2017-06-27 DIAGNOSIS — D509 Iron deficiency anemia, unspecified: Secondary | ICD-10-CM | POA: Diagnosis not present

## 2017-06-27 DIAGNOSIS — N2581 Secondary hyperparathyroidism of renal origin: Secondary | ICD-10-CM | POA: Diagnosis not present

## 2017-06-27 DIAGNOSIS — I2510 Atherosclerotic heart disease of native coronary artery without angina pectoris: Secondary | ICD-10-CM | POA: Diagnosis not present

## 2017-06-27 DIAGNOSIS — I69398 Other sequelae of cerebral infarction: Secondary | ICD-10-CM | POA: Diagnosis not present

## 2017-06-27 DIAGNOSIS — F0150 Vascular dementia without behavioral disturbance: Secondary | ICD-10-CM | POA: Diagnosis not present

## 2017-06-27 DIAGNOSIS — Z992 Dependence on renal dialysis: Secondary | ICD-10-CM | POA: Diagnosis not present

## 2017-06-27 DIAGNOSIS — M5136 Other intervertebral disc degeneration, lumbar region: Secondary | ICD-10-CM | POA: Diagnosis not present

## 2017-06-27 DIAGNOSIS — E11319 Type 2 diabetes mellitus with unspecified diabetic retinopathy without macular edema: Secondary | ICD-10-CM | POA: Diagnosis not present

## 2017-06-27 DIAGNOSIS — Z8781 Personal history of (healed) traumatic fracture: Secondary | ICD-10-CM | POA: Diagnosis not present

## 2017-06-27 DIAGNOSIS — Z8619 Personal history of other infectious and parasitic diseases: Secondary | ICD-10-CM | POA: Diagnosis not present

## 2017-06-27 DIAGNOSIS — H548 Legal blindness, as defined in USA: Secondary | ICD-10-CM | POA: Diagnosis not present

## 2017-06-28 DIAGNOSIS — A0472 Enterocolitis due to Clostridium difficile, not specified as recurrent: Secondary | ICD-10-CM | POA: Diagnosis not present

## 2017-06-28 DIAGNOSIS — F0150 Vascular dementia without behavioral disturbance: Secondary | ICD-10-CM | POA: Diagnosis not present

## 2017-06-28 DIAGNOSIS — I69398 Other sequelae of cerebral infarction: Secondary | ICD-10-CM | POA: Diagnosis not present

## 2017-06-28 DIAGNOSIS — N186 End stage renal disease: Secondary | ICD-10-CM | POA: Diagnosis not present

## 2017-06-28 DIAGNOSIS — I5032 Chronic diastolic (congestive) heart failure: Secondary | ICD-10-CM | POA: Diagnosis not present

## 2017-06-28 DIAGNOSIS — E1122 Type 2 diabetes mellitus with diabetic chronic kidney disease: Secondary | ICD-10-CM | POA: Diagnosis not present

## 2017-06-29 DIAGNOSIS — D509 Iron deficiency anemia, unspecified: Secondary | ICD-10-CM | POA: Diagnosis not present

## 2017-06-29 DIAGNOSIS — N186 End stage renal disease: Secondary | ICD-10-CM | POA: Diagnosis not present

## 2017-06-29 DIAGNOSIS — Z992 Dependence on renal dialysis: Secondary | ICD-10-CM | POA: Diagnosis not present

## 2017-06-29 DIAGNOSIS — D631 Anemia in chronic kidney disease: Secondary | ICD-10-CM | POA: Diagnosis not present

## 2017-06-29 DIAGNOSIS — N2581 Secondary hyperparathyroidism of renal origin: Secondary | ICD-10-CM | POA: Diagnosis not present

## 2017-07-02 DIAGNOSIS — D631 Anemia in chronic kidney disease: Secondary | ICD-10-CM | POA: Diagnosis not present

## 2017-07-02 DIAGNOSIS — D509 Iron deficiency anemia, unspecified: Secondary | ICD-10-CM | POA: Diagnosis not present

## 2017-07-02 DIAGNOSIS — Z992 Dependence on renal dialysis: Secondary | ICD-10-CM | POA: Diagnosis not present

## 2017-07-02 DIAGNOSIS — N2581 Secondary hyperparathyroidism of renal origin: Secondary | ICD-10-CM | POA: Diagnosis not present

## 2017-07-02 DIAGNOSIS — N186 End stage renal disease: Secondary | ICD-10-CM | POA: Diagnosis not present

## 2017-07-03 DIAGNOSIS — N186 End stage renal disease: Secondary | ICD-10-CM | POA: Diagnosis not present

## 2017-07-03 DIAGNOSIS — E1122 Type 2 diabetes mellitus with diabetic chronic kidney disease: Secondary | ICD-10-CM | POA: Diagnosis not present

## 2017-07-03 DIAGNOSIS — I69398 Other sequelae of cerebral infarction: Secondary | ICD-10-CM | POA: Diagnosis not present

## 2017-07-03 DIAGNOSIS — A0472 Enterocolitis due to Clostridium difficile, not specified as recurrent: Secondary | ICD-10-CM | POA: Diagnosis not present

## 2017-07-03 DIAGNOSIS — F0150 Vascular dementia without behavioral disturbance: Secondary | ICD-10-CM | POA: Diagnosis not present

## 2017-07-03 DIAGNOSIS — I5032 Chronic diastolic (congestive) heart failure: Secondary | ICD-10-CM | POA: Diagnosis not present

## 2017-07-04 DIAGNOSIS — I5032 Chronic diastolic (congestive) heart failure: Secondary | ICD-10-CM | POA: Diagnosis not present

## 2017-07-04 DIAGNOSIS — A0472 Enterocolitis due to Clostridium difficile, not specified as recurrent: Secondary | ICD-10-CM | POA: Diagnosis not present

## 2017-07-04 DIAGNOSIS — E1122 Type 2 diabetes mellitus with diabetic chronic kidney disease: Secondary | ICD-10-CM | POA: Diagnosis not present

## 2017-07-04 DIAGNOSIS — N186 End stage renal disease: Secondary | ICD-10-CM | POA: Diagnosis not present

## 2017-07-04 DIAGNOSIS — I69398 Other sequelae of cerebral infarction: Secondary | ICD-10-CM | POA: Diagnosis not present

## 2017-07-04 DIAGNOSIS — F0150 Vascular dementia without behavioral disturbance: Secondary | ICD-10-CM | POA: Diagnosis not present

## 2017-07-05 DIAGNOSIS — Z992 Dependence on renal dialysis: Secondary | ICD-10-CM | POA: Diagnosis not present

## 2017-07-05 DIAGNOSIS — D509 Iron deficiency anemia, unspecified: Secondary | ICD-10-CM | POA: Diagnosis not present

## 2017-07-05 DIAGNOSIS — E1122 Type 2 diabetes mellitus with diabetic chronic kidney disease: Secondary | ICD-10-CM | POA: Diagnosis not present

## 2017-07-05 DIAGNOSIS — A0472 Enterocolitis due to Clostridium difficile, not specified as recurrent: Secondary | ICD-10-CM | POA: Diagnosis not present

## 2017-07-05 DIAGNOSIS — D631 Anemia in chronic kidney disease: Secondary | ICD-10-CM | POA: Diagnosis not present

## 2017-07-05 DIAGNOSIS — N2581 Secondary hyperparathyroidism of renal origin: Secondary | ICD-10-CM | POA: Diagnosis not present

## 2017-07-05 DIAGNOSIS — I69398 Other sequelae of cerebral infarction: Secondary | ICD-10-CM | POA: Diagnosis not present

## 2017-07-05 DIAGNOSIS — F0150 Vascular dementia without behavioral disturbance: Secondary | ICD-10-CM | POA: Diagnosis not present

## 2017-07-05 DIAGNOSIS — I5032 Chronic diastolic (congestive) heart failure: Secondary | ICD-10-CM | POA: Diagnosis not present

## 2017-07-05 DIAGNOSIS — N186 End stage renal disease: Secondary | ICD-10-CM | POA: Diagnosis not present

## 2017-07-07 DIAGNOSIS — N186 End stage renal disease: Secondary | ICD-10-CM | POA: Diagnosis not present

## 2017-07-07 DIAGNOSIS — D631 Anemia in chronic kidney disease: Secondary | ICD-10-CM | POA: Diagnosis not present

## 2017-07-07 DIAGNOSIS — Z992 Dependence on renal dialysis: Secondary | ICD-10-CM | POA: Diagnosis not present

## 2017-07-07 DIAGNOSIS — D509 Iron deficiency anemia, unspecified: Secondary | ICD-10-CM | POA: Diagnosis not present

## 2017-07-07 DIAGNOSIS — N2581 Secondary hyperparathyroidism of renal origin: Secondary | ICD-10-CM | POA: Diagnosis not present

## 2017-07-09 DIAGNOSIS — Z992 Dependence on renal dialysis: Secondary | ICD-10-CM | POA: Diagnosis not present

## 2017-07-09 DIAGNOSIS — D631 Anemia in chronic kidney disease: Secondary | ICD-10-CM | POA: Diagnosis not present

## 2017-07-09 DIAGNOSIS — D509 Iron deficiency anemia, unspecified: Secondary | ICD-10-CM | POA: Diagnosis not present

## 2017-07-09 DIAGNOSIS — N186 End stage renal disease: Secondary | ICD-10-CM | POA: Diagnosis not present

## 2017-07-09 DIAGNOSIS — N2581 Secondary hyperparathyroidism of renal origin: Secondary | ICD-10-CM | POA: Diagnosis not present

## 2017-07-10 DIAGNOSIS — F0150 Vascular dementia without behavioral disturbance: Secondary | ICD-10-CM | POA: Diagnosis not present

## 2017-07-10 DIAGNOSIS — I5032 Chronic diastolic (congestive) heart failure: Secondary | ICD-10-CM | POA: Diagnosis not present

## 2017-07-10 DIAGNOSIS — E1122 Type 2 diabetes mellitus with diabetic chronic kidney disease: Secondary | ICD-10-CM | POA: Diagnosis not present

## 2017-07-10 DIAGNOSIS — N186 End stage renal disease: Secondary | ICD-10-CM | POA: Diagnosis not present

## 2017-07-10 DIAGNOSIS — A0472 Enterocolitis due to Clostridium difficile, not specified as recurrent: Secondary | ICD-10-CM | POA: Diagnosis not present

## 2017-07-10 DIAGNOSIS — I69398 Other sequelae of cerebral infarction: Secondary | ICD-10-CM | POA: Diagnosis not present

## 2017-07-11 DIAGNOSIS — N186 End stage renal disease: Secondary | ICD-10-CM | POA: Diagnosis not present

## 2017-07-11 DIAGNOSIS — Z992 Dependence on renal dialysis: Secondary | ICD-10-CM | POA: Diagnosis not present

## 2017-07-11 DIAGNOSIS — D631 Anemia in chronic kidney disease: Secondary | ICD-10-CM | POA: Diagnosis not present

## 2017-07-11 DIAGNOSIS — D509 Iron deficiency anemia, unspecified: Secondary | ICD-10-CM | POA: Diagnosis not present

## 2017-07-11 DIAGNOSIS — N2581 Secondary hyperparathyroidism of renal origin: Secondary | ICD-10-CM | POA: Diagnosis not present

## 2017-07-12 DIAGNOSIS — E1122 Type 2 diabetes mellitus with diabetic chronic kidney disease: Secondary | ICD-10-CM | POA: Diagnosis not present

## 2017-07-12 DIAGNOSIS — I69398 Other sequelae of cerebral infarction: Secondary | ICD-10-CM | POA: Diagnosis not present

## 2017-07-12 DIAGNOSIS — I5032 Chronic diastolic (congestive) heart failure: Secondary | ICD-10-CM | POA: Diagnosis not present

## 2017-07-12 DIAGNOSIS — N186 End stage renal disease: Secondary | ICD-10-CM | POA: Diagnosis not present

## 2017-07-12 DIAGNOSIS — A0472 Enterocolitis due to Clostridium difficile, not specified as recurrent: Secondary | ICD-10-CM | POA: Diagnosis not present

## 2017-07-12 DIAGNOSIS — F0150 Vascular dementia without behavioral disturbance: Secondary | ICD-10-CM | POA: Diagnosis not present

## 2017-07-13 DIAGNOSIS — A0472 Enterocolitis due to Clostridium difficile, not specified as recurrent: Secondary | ICD-10-CM | POA: Diagnosis not present

## 2017-07-13 DIAGNOSIS — I5032 Chronic diastolic (congestive) heart failure: Secondary | ICD-10-CM | POA: Diagnosis not present

## 2017-07-13 DIAGNOSIS — N186 End stage renal disease: Secondary | ICD-10-CM | POA: Diagnosis not present

## 2017-07-13 DIAGNOSIS — I69398 Other sequelae of cerebral infarction: Secondary | ICD-10-CM | POA: Diagnosis not present

## 2017-07-13 DIAGNOSIS — D631 Anemia in chronic kidney disease: Secondary | ICD-10-CM | POA: Diagnosis not present

## 2017-07-13 DIAGNOSIS — D509 Iron deficiency anemia, unspecified: Secondary | ICD-10-CM | POA: Diagnosis not present

## 2017-07-13 DIAGNOSIS — E1122 Type 2 diabetes mellitus with diabetic chronic kidney disease: Secondary | ICD-10-CM | POA: Diagnosis not present

## 2017-07-13 DIAGNOSIS — F0150 Vascular dementia without behavioral disturbance: Secondary | ICD-10-CM | POA: Diagnosis not present

## 2017-07-13 DIAGNOSIS — N2581 Secondary hyperparathyroidism of renal origin: Secondary | ICD-10-CM | POA: Diagnosis not present

## 2017-07-13 DIAGNOSIS — R531 Weakness: Secondary | ICD-10-CM | POA: Diagnosis not present

## 2017-07-13 DIAGNOSIS — Z992 Dependence on renal dialysis: Secondary | ICD-10-CM | POA: Diagnosis not present

## 2017-07-16 DIAGNOSIS — E1122 Type 2 diabetes mellitus with diabetic chronic kidney disease: Secondary | ICD-10-CM | POA: Diagnosis not present

## 2017-07-16 DIAGNOSIS — D631 Anemia in chronic kidney disease: Secondary | ICD-10-CM | POA: Diagnosis not present

## 2017-07-16 DIAGNOSIS — D509 Iron deficiency anemia, unspecified: Secondary | ICD-10-CM | POA: Diagnosis not present

## 2017-07-16 DIAGNOSIS — I5032 Chronic diastolic (congestive) heart failure: Secondary | ICD-10-CM | POA: Diagnosis not present

## 2017-07-16 DIAGNOSIS — Z794 Long term (current) use of insulin: Secondary | ICD-10-CM | POA: Diagnosis not present

## 2017-07-16 DIAGNOSIS — I69398 Other sequelae of cerebral infarction: Secondary | ICD-10-CM | POA: Diagnosis not present

## 2017-07-16 DIAGNOSIS — E119 Type 2 diabetes mellitus without complications: Secondary | ICD-10-CM | POA: Diagnosis not present

## 2017-07-16 DIAGNOSIS — N186 End stage renal disease: Secondary | ICD-10-CM | POA: Diagnosis not present

## 2017-07-16 DIAGNOSIS — N2581 Secondary hyperparathyroidism of renal origin: Secondary | ICD-10-CM | POA: Diagnosis not present

## 2017-07-16 DIAGNOSIS — Z992 Dependence on renal dialysis: Secondary | ICD-10-CM | POA: Diagnosis not present

## 2017-07-16 DIAGNOSIS — F0150 Vascular dementia without behavioral disturbance: Secondary | ICD-10-CM | POA: Diagnosis not present

## 2017-07-16 DIAGNOSIS — A0472 Enterocolitis due to Clostridium difficile, not specified as recurrent: Secondary | ICD-10-CM | POA: Diagnosis not present

## 2017-07-17 DIAGNOSIS — I69398 Other sequelae of cerebral infarction: Secondary | ICD-10-CM | POA: Diagnosis not present

## 2017-07-17 DIAGNOSIS — A0472 Enterocolitis due to Clostridium difficile, not specified as recurrent: Secondary | ICD-10-CM | POA: Diagnosis not present

## 2017-07-17 DIAGNOSIS — L0231 Cutaneous abscess of buttock: Secondary | ICD-10-CM | POA: Diagnosis not present

## 2017-07-17 DIAGNOSIS — E1122 Type 2 diabetes mellitus with diabetic chronic kidney disease: Secondary | ICD-10-CM | POA: Diagnosis not present

## 2017-07-17 DIAGNOSIS — N186 End stage renal disease: Secondary | ICD-10-CM | POA: Diagnosis not present

## 2017-07-17 DIAGNOSIS — F0150 Vascular dementia without behavioral disturbance: Secondary | ICD-10-CM | POA: Diagnosis not present

## 2017-07-17 DIAGNOSIS — I5032 Chronic diastolic (congestive) heart failure: Secondary | ICD-10-CM | POA: Diagnosis not present

## 2017-07-18 DIAGNOSIS — N2581 Secondary hyperparathyroidism of renal origin: Secondary | ICD-10-CM | POA: Diagnosis not present

## 2017-07-18 DIAGNOSIS — N186 End stage renal disease: Secondary | ICD-10-CM | POA: Diagnosis not present

## 2017-07-18 DIAGNOSIS — D631 Anemia in chronic kidney disease: Secondary | ICD-10-CM | POA: Diagnosis not present

## 2017-07-18 DIAGNOSIS — D509 Iron deficiency anemia, unspecified: Secondary | ICD-10-CM | POA: Diagnosis not present

## 2017-07-18 DIAGNOSIS — Z992 Dependence on renal dialysis: Secondary | ICD-10-CM | POA: Diagnosis not present

## 2017-07-19 DIAGNOSIS — I69398 Other sequelae of cerebral infarction: Secondary | ICD-10-CM | POA: Diagnosis not present

## 2017-07-19 DIAGNOSIS — I5032 Chronic diastolic (congestive) heart failure: Secondary | ICD-10-CM | POA: Diagnosis not present

## 2017-07-19 DIAGNOSIS — N186 End stage renal disease: Secondary | ICD-10-CM | POA: Diagnosis not present

## 2017-07-19 DIAGNOSIS — F0150 Vascular dementia without behavioral disturbance: Secondary | ICD-10-CM | POA: Diagnosis not present

## 2017-07-19 DIAGNOSIS — A0472 Enterocolitis due to Clostridium difficile, not specified as recurrent: Secondary | ICD-10-CM | POA: Diagnosis not present

## 2017-07-19 DIAGNOSIS — E1122 Type 2 diabetes mellitus with diabetic chronic kidney disease: Secondary | ICD-10-CM | POA: Diagnosis not present

## 2017-07-20 DIAGNOSIS — D631 Anemia in chronic kidney disease: Secondary | ICD-10-CM | POA: Diagnosis not present

## 2017-07-20 DIAGNOSIS — N2581 Secondary hyperparathyroidism of renal origin: Secondary | ICD-10-CM | POA: Diagnosis not present

## 2017-07-20 DIAGNOSIS — Z992 Dependence on renal dialysis: Secondary | ICD-10-CM | POA: Diagnosis not present

## 2017-07-20 DIAGNOSIS — N186 End stage renal disease: Secondary | ICD-10-CM | POA: Diagnosis not present

## 2017-07-20 DIAGNOSIS — D509 Iron deficiency anemia, unspecified: Secondary | ICD-10-CM | POA: Diagnosis not present

## 2017-07-22 DIAGNOSIS — Z992 Dependence on renal dialysis: Secondary | ICD-10-CM | POA: Diagnosis not present

## 2017-07-22 DIAGNOSIS — N186 End stage renal disease: Secondary | ICD-10-CM | POA: Diagnosis not present

## 2017-07-23 DIAGNOSIS — A0472 Enterocolitis due to Clostridium difficile, not specified as recurrent: Secondary | ICD-10-CM | POA: Diagnosis not present

## 2017-07-23 DIAGNOSIS — N2581 Secondary hyperparathyroidism of renal origin: Secondary | ICD-10-CM | POA: Diagnosis not present

## 2017-07-23 DIAGNOSIS — E1122 Type 2 diabetes mellitus with diabetic chronic kidney disease: Secondary | ICD-10-CM | POA: Diagnosis not present

## 2017-07-23 DIAGNOSIS — I5032 Chronic diastolic (congestive) heart failure: Secondary | ICD-10-CM | POA: Diagnosis not present

## 2017-07-23 DIAGNOSIS — I69398 Other sequelae of cerebral infarction: Secondary | ICD-10-CM | POA: Diagnosis not present

## 2017-07-23 DIAGNOSIS — D631 Anemia in chronic kidney disease: Secondary | ICD-10-CM | POA: Diagnosis not present

## 2017-07-23 DIAGNOSIS — D509 Iron deficiency anemia, unspecified: Secondary | ICD-10-CM | POA: Diagnosis not present

## 2017-07-23 DIAGNOSIS — F0150 Vascular dementia without behavioral disturbance: Secondary | ICD-10-CM | POA: Diagnosis not present

## 2017-07-23 DIAGNOSIS — N186 End stage renal disease: Secondary | ICD-10-CM | POA: Diagnosis not present

## 2017-07-23 DIAGNOSIS — Z992 Dependence on renal dialysis: Secondary | ICD-10-CM | POA: Diagnosis not present

## 2017-07-24 DIAGNOSIS — A0472 Enterocolitis due to Clostridium difficile, not specified as recurrent: Secondary | ICD-10-CM | POA: Diagnosis not present

## 2017-07-24 DIAGNOSIS — N186 End stage renal disease: Secondary | ICD-10-CM | POA: Diagnosis not present

## 2017-07-24 DIAGNOSIS — I5032 Chronic diastolic (congestive) heart failure: Secondary | ICD-10-CM | POA: Diagnosis not present

## 2017-07-24 DIAGNOSIS — F0150 Vascular dementia without behavioral disturbance: Secondary | ICD-10-CM | POA: Diagnosis not present

## 2017-07-24 DIAGNOSIS — I69398 Other sequelae of cerebral infarction: Secondary | ICD-10-CM | POA: Diagnosis not present

## 2017-07-24 DIAGNOSIS — E1122 Type 2 diabetes mellitus with diabetic chronic kidney disease: Secondary | ICD-10-CM | POA: Diagnosis not present

## 2017-07-25 DIAGNOSIS — Z992 Dependence on renal dialysis: Secondary | ICD-10-CM | POA: Diagnosis not present

## 2017-07-25 DIAGNOSIS — D631 Anemia in chronic kidney disease: Secondary | ICD-10-CM | POA: Diagnosis not present

## 2017-07-25 DIAGNOSIS — N2581 Secondary hyperparathyroidism of renal origin: Secondary | ICD-10-CM | POA: Diagnosis not present

## 2017-07-25 DIAGNOSIS — D509 Iron deficiency anemia, unspecified: Secondary | ICD-10-CM | POA: Diagnosis not present

## 2017-07-25 DIAGNOSIS — N186 End stage renal disease: Secondary | ICD-10-CM | POA: Diagnosis not present

## 2017-07-26 DIAGNOSIS — E1122 Type 2 diabetes mellitus with diabetic chronic kidney disease: Secondary | ICD-10-CM | POA: Diagnosis not present

## 2017-07-26 DIAGNOSIS — N186 End stage renal disease: Secondary | ICD-10-CM | POA: Diagnosis not present

## 2017-07-26 DIAGNOSIS — I69398 Other sequelae of cerebral infarction: Secondary | ICD-10-CM | POA: Diagnosis not present

## 2017-07-26 DIAGNOSIS — I5032 Chronic diastolic (congestive) heart failure: Secondary | ICD-10-CM | POA: Diagnosis not present

## 2017-07-26 DIAGNOSIS — A0472 Enterocolitis due to Clostridium difficile, not specified as recurrent: Secondary | ICD-10-CM | POA: Diagnosis not present

## 2017-07-26 DIAGNOSIS — F0150 Vascular dementia without behavioral disturbance: Secondary | ICD-10-CM | POA: Diagnosis not present

## 2017-07-27 DIAGNOSIS — N186 End stage renal disease: Secondary | ICD-10-CM | POA: Diagnosis not present

## 2017-07-27 DIAGNOSIS — Z992 Dependence on renal dialysis: Secondary | ICD-10-CM | POA: Diagnosis not present

## 2017-07-27 DIAGNOSIS — D509 Iron deficiency anemia, unspecified: Secondary | ICD-10-CM | POA: Diagnosis not present

## 2017-07-27 DIAGNOSIS — N2581 Secondary hyperparathyroidism of renal origin: Secondary | ICD-10-CM | POA: Diagnosis not present

## 2017-07-27 DIAGNOSIS — D631 Anemia in chronic kidney disease: Secondary | ICD-10-CM | POA: Diagnosis not present

## 2017-07-30 DIAGNOSIS — D631 Anemia in chronic kidney disease: Secondary | ICD-10-CM | POA: Diagnosis not present

## 2017-07-30 DIAGNOSIS — N186 End stage renal disease: Secondary | ICD-10-CM | POA: Diagnosis not present

## 2017-07-30 DIAGNOSIS — N2581 Secondary hyperparathyroidism of renal origin: Secondary | ICD-10-CM | POA: Diagnosis not present

## 2017-07-30 DIAGNOSIS — D509 Iron deficiency anemia, unspecified: Secondary | ICD-10-CM | POA: Diagnosis not present

## 2017-07-30 DIAGNOSIS — Z992 Dependence on renal dialysis: Secondary | ICD-10-CM | POA: Diagnosis not present

## 2017-07-31 DIAGNOSIS — N186 End stage renal disease: Secondary | ICD-10-CM | POA: Diagnosis not present

## 2017-07-31 DIAGNOSIS — I69398 Other sequelae of cerebral infarction: Secondary | ICD-10-CM | POA: Diagnosis not present

## 2017-07-31 DIAGNOSIS — F0150 Vascular dementia without behavioral disturbance: Secondary | ICD-10-CM | POA: Diagnosis not present

## 2017-07-31 DIAGNOSIS — A0472 Enterocolitis due to Clostridium difficile, not specified as recurrent: Secondary | ICD-10-CM | POA: Diagnosis not present

## 2017-07-31 DIAGNOSIS — E1122 Type 2 diabetes mellitus with diabetic chronic kidney disease: Secondary | ICD-10-CM | POA: Diagnosis not present

## 2017-07-31 DIAGNOSIS — I5032 Chronic diastolic (congestive) heart failure: Secondary | ICD-10-CM | POA: Diagnosis not present

## 2017-08-01 DIAGNOSIS — D509 Iron deficiency anemia, unspecified: Secondary | ICD-10-CM | POA: Diagnosis not present

## 2017-08-01 DIAGNOSIS — N186 End stage renal disease: Secondary | ICD-10-CM | POA: Diagnosis not present

## 2017-08-01 DIAGNOSIS — Z992 Dependence on renal dialysis: Secondary | ICD-10-CM | POA: Diagnosis not present

## 2017-08-01 DIAGNOSIS — D631 Anemia in chronic kidney disease: Secondary | ICD-10-CM | POA: Diagnosis not present

## 2017-08-01 DIAGNOSIS — N2581 Secondary hyperparathyroidism of renal origin: Secondary | ICD-10-CM | POA: Diagnosis not present

## 2017-08-02 DIAGNOSIS — A0472 Enterocolitis due to Clostridium difficile, not specified as recurrent: Secondary | ICD-10-CM | POA: Diagnosis not present

## 2017-08-02 DIAGNOSIS — N186 End stage renal disease: Secondary | ICD-10-CM | POA: Diagnosis not present

## 2017-08-02 DIAGNOSIS — I5032 Chronic diastolic (congestive) heart failure: Secondary | ICD-10-CM | POA: Diagnosis not present

## 2017-08-02 DIAGNOSIS — E1122 Type 2 diabetes mellitus with diabetic chronic kidney disease: Secondary | ICD-10-CM | POA: Diagnosis not present

## 2017-08-02 DIAGNOSIS — I69398 Other sequelae of cerebral infarction: Secondary | ICD-10-CM | POA: Diagnosis not present

## 2017-08-02 DIAGNOSIS — F0150 Vascular dementia without behavioral disturbance: Secondary | ICD-10-CM | POA: Diagnosis not present

## 2017-08-03 DIAGNOSIS — D631 Anemia in chronic kidney disease: Secondary | ICD-10-CM | POA: Diagnosis not present

## 2017-08-03 DIAGNOSIS — Z992 Dependence on renal dialysis: Secondary | ICD-10-CM | POA: Diagnosis not present

## 2017-08-03 DIAGNOSIS — N2581 Secondary hyperparathyroidism of renal origin: Secondary | ICD-10-CM | POA: Diagnosis not present

## 2017-08-03 DIAGNOSIS — N186 End stage renal disease: Secondary | ICD-10-CM | POA: Diagnosis not present

## 2017-08-03 DIAGNOSIS — D509 Iron deficiency anemia, unspecified: Secondary | ICD-10-CM | POA: Diagnosis not present

## 2017-08-06 DIAGNOSIS — Z992 Dependence on renal dialysis: Secondary | ICD-10-CM | POA: Diagnosis not present

## 2017-08-06 DIAGNOSIS — R229 Localized swelling, mass and lump, unspecified: Secondary | ICD-10-CM | POA: Diagnosis not present

## 2017-08-06 DIAGNOSIS — N2581 Secondary hyperparathyroidism of renal origin: Secondary | ICD-10-CM | POA: Diagnosis not present

## 2017-08-06 DIAGNOSIS — R509 Fever, unspecified: Secondary | ICD-10-CM | POA: Diagnosis not present

## 2017-08-06 DIAGNOSIS — D631 Anemia in chronic kidney disease: Secondary | ICD-10-CM | POA: Diagnosis not present

## 2017-08-06 DIAGNOSIS — N186 End stage renal disease: Secondary | ICD-10-CM | POA: Diagnosis not present

## 2017-08-06 DIAGNOSIS — D509 Iron deficiency anemia, unspecified: Secondary | ICD-10-CM | POA: Diagnosis not present

## 2017-08-06 DIAGNOSIS — R609 Edema, unspecified: Secondary | ICD-10-CM | POA: Diagnosis not present

## 2017-08-07 DIAGNOSIS — I5032 Chronic diastolic (congestive) heart failure: Secondary | ICD-10-CM | POA: Diagnosis not present

## 2017-08-07 DIAGNOSIS — I69398 Other sequelae of cerebral infarction: Secondary | ICD-10-CM | POA: Diagnosis not present

## 2017-08-07 DIAGNOSIS — A0472 Enterocolitis due to Clostridium difficile, not specified as recurrent: Secondary | ICD-10-CM | POA: Diagnosis not present

## 2017-08-07 DIAGNOSIS — F0150 Vascular dementia without behavioral disturbance: Secondary | ICD-10-CM | POA: Diagnosis not present

## 2017-08-07 DIAGNOSIS — N186 End stage renal disease: Secondary | ICD-10-CM | POA: Diagnosis not present

## 2017-08-07 DIAGNOSIS — E1122 Type 2 diabetes mellitus with diabetic chronic kidney disease: Secondary | ICD-10-CM | POA: Diagnosis not present

## 2017-08-08 DIAGNOSIS — Z992 Dependence on renal dialysis: Secondary | ICD-10-CM | POA: Diagnosis not present

## 2017-08-08 DIAGNOSIS — N186 End stage renal disease: Secondary | ICD-10-CM | POA: Diagnosis not present

## 2017-08-08 DIAGNOSIS — D509 Iron deficiency anemia, unspecified: Secondary | ICD-10-CM | POA: Diagnosis not present

## 2017-08-08 DIAGNOSIS — D631 Anemia in chronic kidney disease: Secondary | ICD-10-CM | POA: Diagnosis not present

## 2017-08-08 DIAGNOSIS — N2581 Secondary hyperparathyroidism of renal origin: Secondary | ICD-10-CM | POA: Diagnosis not present

## 2017-08-09 DIAGNOSIS — Z1389 Encounter for screening for other disorder: Secondary | ICD-10-CM | POA: Diagnosis not present

## 2017-08-09 DIAGNOSIS — M545 Low back pain: Secondary | ICD-10-CM | POA: Diagnosis not present

## 2017-08-09 DIAGNOSIS — I10 Essential (primary) hypertension: Secondary | ICD-10-CM | POA: Diagnosis not present

## 2017-08-09 DIAGNOSIS — E119 Type 2 diabetes mellitus without complications: Secondary | ICD-10-CM | POA: Diagnosis not present

## 2017-08-09 DIAGNOSIS — Z0000 Encounter for general adult medical examination without abnormal findings: Secondary | ICD-10-CM | POA: Diagnosis not present

## 2017-08-09 DIAGNOSIS — F320 Major depressive disorder, single episode, mild: Secondary | ICD-10-CM | POA: Diagnosis not present

## 2017-08-10 DIAGNOSIS — D509 Iron deficiency anemia, unspecified: Secondary | ICD-10-CM | POA: Diagnosis not present

## 2017-08-10 DIAGNOSIS — Z992 Dependence on renal dialysis: Secondary | ICD-10-CM | POA: Diagnosis not present

## 2017-08-10 DIAGNOSIS — D631 Anemia in chronic kidney disease: Secondary | ICD-10-CM | POA: Diagnosis not present

## 2017-08-10 DIAGNOSIS — N2581 Secondary hyperparathyroidism of renal origin: Secondary | ICD-10-CM | POA: Diagnosis not present

## 2017-08-10 DIAGNOSIS — N186 End stage renal disease: Secondary | ICD-10-CM | POA: Diagnosis not present

## 2017-08-13 DIAGNOSIS — D631 Anemia in chronic kidney disease: Secondary | ICD-10-CM | POA: Diagnosis not present

## 2017-08-13 DIAGNOSIS — D509 Iron deficiency anemia, unspecified: Secondary | ICD-10-CM | POA: Diagnosis not present

## 2017-08-13 DIAGNOSIS — N2581 Secondary hyperparathyroidism of renal origin: Secondary | ICD-10-CM | POA: Diagnosis not present

## 2017-08-13 DIAGNOSIS — Z992 Dependence on renal dialysis: Secondary | ICD-10-CM | POA: Diagnosis not present

## 2017-08-13 DIAGNOSIS — N186 End stage renal disease: Secondary | ICD-10-CM | POA: Diagnosis not present

## 2017-08-14 DIAGNOSIS — I5032 Chronic diastolic (congestive) heart failure: Secondary | ICD-10-CM | POA: Diagnosis not present

## 2017-08-14 DIAGNOSIS — I69398 Other sequelae of cerebral infarction: Secondary | ICD-10-CM | POA: Diagnosis not present

## 2017-08-14 DIAGNOSIS — F0150 Vascular dementia without behavioral disturbance: Secondary | ICD-10-CM | POA: Diagnosis not present

## 2017-08-14 DIAGNOSIS — N186 End stage renal disease: Secondary | ICD-10-CM | POA: Diagnosis not present

## 2017-08-14 DIAGNOSIS — A0472 Enterocolitis due to Clostridium difficile, not specified as recurrent: Secondary | ICD-10-CM | POA: Diagnosis not present

## 2017-08-14 DIAGNOSIS — E1122 Type 2 diabetes mellitus with diabetic chronic kidney disease: Secondary | ICD-10-CM | POA: Diagnosis not present

## 2017-08-15 DIAGNOSIS — D631 Anemia in chronic kidney disease: Secondary | ICD-10-CM | POA: Diagnosis not present

## 2017-08-15 DIAGNOSIS — N186 End stage renal disease: Secondary | ICD-10-CM | POA: Diagnosis not present

## 2017-08-15 DIAGNOSIS — N2581 Secondary hyperparathyroidism of renal origin: Secondary | ICD-10-CM | POA: Diagnosis not present

## 2017-08-15 DIAGNOSIS — Z992 Dependence on renal dialysis: Secondary | ICD-10-CM | POA: Diagnosis not present

## 2017-08-15 DIAGNOSIS — D509 Iron deficiency anemia, unspecified: Secondary | ICD-10-CM | POA: Diagnosis not present

## 2017-08-16 DIAGNOSIS — N186 End stage renal disease: Secondary | ICD-10-CM | POA: Diagnosis not present

## 2017-08-16 DIAGNOSIS — I69398 Other sequelae of cerebral infarction: Secondary | ICD-10-CM | POA: Diagnosis not present

## 2017-08-16 DIAGNOSIS — A0472 Enterocolitis due to Clostridium difficile, not specified as recurrent: Secondary | ICD-10-CM | POA: Diagnosis not present

## 2017-08-16 DIAGNOSIS — I5032 Chronic diastolic (congestive) heart failure: Secondary | ICD-10-CM | POA: Diagnosis not present

## 2017-08-16 DIAGNOSIS — F0150 Vascular dementia without behavioral disturbance: Secondary | ICD-10-CM | POA: Diagnosis not present

## 2017-08-16 DIAGNOSIS — E1122 Type 2 diabetes mellitus with diabetic chronic kidney disease: Secondary | ICD-10-CM | POA: Diagnosis not present

## 2017-08-17 DIAGNOSIS — N186 End stage renal disease: Secondary | ICD-10-CM | POA: Diagnosis not present

## 2017-08-17 DIAGNOSIS — Z992 Dependence on renal dialysis: Secondary | ICD-10-CM | POA: Diagnosis not present

## 2017-08-17 DIAGNOSIS — N2581 Secondary hyperparathyroidism of renal origin: Secondary | ICD-10-CM | POA: Diagnosis not present

## 2017-08-17 DIAGNOSIS — D631 Anemia in chronic kidney disease: Secondary | ICD-10-CM | POA: Diagnosis not present

## 2017-08-17 DIAGNOSIS — D509 Iron deficiency anemia, unspecified: Secondary | ICD-10-CM | POA: Diagnosis not present

## 2017-08-20 DIAGNOSIS — D631 Anemia in chronic kidney disease: Secondary | ICD-10-CM | POA: Diagnosis not present

## 2017-08-20 DIAGNOSIS — Z992 Dependence on renal dialysis: Secondary | ICD-10-CM | POA: Diagnosis not present

## 2017-08-20 DIAGNOSIS — N186 End stage renal disease: Secondary | ICD-10-CM | POA: Diagnosis not present

## 2017-08-20 DIAGNOSIS — N2581 Secondary hyperparathyroidism of renal origin: Secondary | ICD-10-CM | POA: Diagnosis not present

## 2017-08-20 DIAGNOSIS — D509 Iron deficiency anemia, unspecified: Secondary | ICD-10-CM | POA: Diagnosis not present

## 2017-08-21 DIAGNOSIS — N186 End stage renal disease: Secondary | ICD-10-CM | POA: Diagnosis not present

## 2017-08-21 DIAGNOSIS — E1122 Type 2 diabetes mellitus with diabetic chronic kidney disease: Secondary | ICD-10-CM | POA: Diagnosis not present

## 2017-08-21 DIAGNOSIS — I5032 Chronic diastolic (congestive) heart failure: Secondary | ICD-10-CM | POA: Diagnosis not present

## 2017-08-21 DIAGNOSIS — A0472 Enterocolitis due to Clostridium difficile, not specified as recurrent: Secondary | ICD-10-CM | POA: Diagnosis not present

## 2017-08-21 DIAGNOSIS — F0150 Vascular dementia without behavioral disturbance: Secondary | ICD-10-CM | POA: Diagnosis not present

## 2017-08-21 DIAGNOSIS — I69398 Other sequelae of cerebral infarction: Secondary | ICD-10-CM | POA: Diagnosis not present

## 2017-08-22 DIAGNOSIS — D631 Anemia in chronic kidney disease: Secondary | ICD-10-CM | POA: Diagnosis not present

## 2017-08-22 DIAGNOSIS — Z992 Dependence on renal dialysis: Secondary | ICD-10-CM | POA: Diagnosis not present

## 2017-08-22 DIAGNOSIS — N2581 Secondary hyperparathyroidism of renal origin: Secondary | ICD-10-CM | POA: Diagnosis not present

## 2017-08-22 DIAGNOSIS — N186 End stage renal disease: Secondary | ICD-10-CM | POA: Diagnosis not present

## 2017-08-22 DIAGNOSIS — D509 Iron deficiency anemia, unspecified: Secondary | ICD-10-CM | POA: Diagnosis not present

## 2017-08-23 DIAGNOSIS — L03129 Acute lymphangitis of unspecified part of limb: Secondary | ICD-10-CM | POA: Diagnosis not present

## 2017-08-24 DIAGNOSIS — D631 Anemia in chronic kidney disease: Secondary | ICD-10-CM | POA: Diagnosis not present

## 2017-08-24 DIAGNOSIS — D509 Iron deficiency anemia, unspecified: Secondary | ICD-10-CM | POA: Diagnosis not present

## 2017-08-24 DIAGNOSIS — Z992 Dependence on renal dialysis: Secondary | ICD-10-CM | POA: Diagnosis not present

## 2017-08-24 DIAGNOSIS — N186 End stage renal disease: Secondary | ICD-10-CM | POA: Diagnosis not present

## 2017-08-24 DIAGNOSIS — N2581 Secondary hyperparathyroidism of renal origin: Secondary | ICD-10-CM | POA: Diagnosis not present

## 2017-08-27 DIAGNOSIS — D509 Iron deficiency anemia, unspecified: Secondary | ICD-10-CM | POA: Diagnosis not present

## 2017-08-27 DIAGNOSIS — N186 End stage renal disease: Secondary | ICD-10-CM | POA: Diagnosis not present

## 2017-08-27 DIAGNOSIS — N2581 Secondary hyperparathyroidism of renal origin: Secondary | ICD-10-CM | POA: Diagnosis not present

## 2017-08-27 DIAGNOSIS — Z992 Dependence on renal dialysis: Secondary | ICD-10-CM | POA: Diagnosis not present

## 2017-08-27 DIAGNOSIS — D631 Anemia in chronic kidney disease: Secondary | ICD-10-CM | POA: Diagnosis not present

## 2017-08-28 DIAGNOSIS — I10 Essential (primary) hypertension: Secondary | ICD-10-CM | POA: Diagnosis not present

## 2017-08-28 DIAGNOSIS — M545 Low back pain: Secondary | ICD-10-CM | POA: Diagnosis not present

## 2017-08-28 DIAGNOSIS — E119 Type 2 diabetes mellitus without complications: Secondary | ICD-10-CM | POA: Diagnosis not present

## 2017-08-29 DIAGNOSIS — Z992 Dependence on renal dialysis: Secondary | ICD-10-CM | POA: Diagnosis not present

## 2017-08-29 DIAGNOSIS — D631 Anemia in chronic kidney disease: Secondary | ICD-10-CM | POA: Diagnosis not present

## 2017-08-29 DIAGNOSIS — N2581 Secondary hyperparathyroidism of renal origin: Secondary | ICD-10-CM | POA: Diagnosis not present

## 2017-08-29 DIAGNOSIS — D509 Iron deficiency anemia, unspecified: Secondary | ICD-10-CM | POA: Diagnosis not present

## 2017-08-29 DIAGNOSIS — N186 End stage renal disease: Secondary | ICD-10-CM | POA: Diagnosis not present

## 2017-08-31 DIAGNOSIS — D509 Iron deficiency anemia, unspecified: Secondary | ICD-10-CM | POA: Diagnosis not present

## 2017-08-31 DIAGNOSIS — N2581 Secondary hyperparathyroidism of renal origin: Secondary | ICD-10-CM | POA: Diagnosis not present

## 2017-08-31 DIAGNOSIS — Z992 Dependence on renal dialysis: Secondary | ICD-10-CM | POA: Diagnosis not present

## 2017-08-31 DIAGNOSIS — N186 End stage renal disease: Secondary | ICD-10-CM | POA: Diagnosis not present

## 2017-08-31 DIAGNOSIS — D631 Anemia in chronic kidney disease: Secondary | ICD-10-CM | POA: Diagnosis not present

## 2017-09-03 DIAGNOSIS — Z992 Dependence on renal dialysis: Secondary | ICD-10-CM | POA: Diagnosis not present

## 2017-09-03 DIAGNOSIS — N2581 Secondary hyperparathyroidism of renal origin: Secondary | ICD-10-CM | POA: Diagnosis not present

## 2017-09-03 DIAGNOSIS — N186 End stage renal disease: Secondary | ICD-10-CM | POA: Diagnosis not present

## 2017-09-03 DIAGNOSIS — D509 Iron deficiency anemia, unspecified: Secondary | ICD-10-CM | POA: Diagnosis not present

## 2017-09-03 DIAGNOSIS — D631 Anemia in chronic kidney disease: Secondary | ICD-10-CM | POA: Diagnosis not present

## 2017-09-05 DIAGNOSIS — N186 End stage renal disease: Secondary | ICD-10-CM | POA: Diagnosis not present

## 2017-09-05 DIAGNOSIS — Z992 Dependence on renal dialysis: Secondary | ICD-10-CM | POA: Diagnosis not present

## 2017-09-05 DIAGNOSIS — D509 Iron deficiency anemia, unspecified: Secondary | ICD-10-CM | POA: Diagnosis not present

## 2017-09-05 DIAGNOSIS — D631 Anemia in chronic kidney disease: Secondary | ICD-10-CM | POA: Diagnosis not present

## 2017-09-05 DIAGNOSIS — N2581 Secondary hyperparathyroidism of renal origin: Secondary | ICD-10-CM | POA: Diagnosis not present

## 2017-09-06 DIAGNOSIS — L97521 Non-pressure chronic ulcer of other part of left foot limited to breakdown of skin: Secondary | ICD-10-CM | POA: Diagnosis not present

## 2017-09-06 DIAGNOSIS — L97511 Non-pressure chronic ulcer of other part of right foot limited to breakdown of skin: Secondary | ICD-10-CM | POA: Diagnosis not present

## 2017-09-07 DIAGNOSIS — D631 Anemia in chronic kidney disease: Secondary | ICD-10-CM | POA: Diagnosis not present

## 2017-09-07 DIAGNOSIS — Z992 Dependence on renal dialysis: Secondary | ICD-10-CM | POA: Diagnosis not present

## 2017-09-07 DIAGNOSIS — N2581 Secondary hyperparathyroidism of renal origin: Secondary | ICD-10-CM | POA: Diagnosis not present

## 2017-09-07 DIAGNOSIS — N186 End stage renal disease: Secondary | ICD-10-CM | POA: Diagnosis not present

## 2017-09-07 DIAGNOSIS — D509 Iron deficiency anemia, unspecified: Secondary | ICD-10-CM | POA: Diagnosis not present

## 2017-09-10 DIAGNOSIS — Z992 Dependence on renal dialysis: Secondary | ICD-10-CM | POA: Diagnosis not present

## 2017-09-10 DIAGNOSIS — N2581 Secondary hyperparathyroidism of renal origin: Secondary | ICD-10-CM | POA: Diagnosis not present

## 2017-09-10 DIAGNOSIS — D509 Iron deficiency anemia, unspecified: Secondary | ICD-10-CM | POA: Diagnosis not present

## 2017-09-10 DIAGNOSIS — N186 End stage renal disease: Secondary | ICD-10-CM | POA: Diagnosis not present

## 2017-09-10 DIAGNOSIS — D631 Anemia in chronic kidney disease: Secondary | ICD-10-CM | POA: Diagnosis not present

## 2017-09-12 DIAGNOSIS — Z992 Dependence on renal dialysis: Secondary | ICD-10-CM | POA: Diagnosis not present

## 2017-09-12 DIAGNOSIS — N2581 Secondary hyperparathyroidism of renal origin: Secondary | ICD-10-CM | POA: Diagnosis not present

## 2017-09-12 DIAGNOSIS — N186 End stage renal disease: Secondary | ICD-10-CM | POA: Diagnosis not present

## 2017-09-12 DIAGNOSIS — D509 Iron deficiency anemia, unspecified: Secondary | ICD-10-CM | POA: Diagnosis not present

## 2017-09-12 DIAGNOSIS — D631 Anemia in chronic kidney disease: Secondary | ICD-10-CM | POA: Diagnosis not present

## 2017-09-14 DIAGNOSIS — N186 End stage renal disease: Secondary | ICD-10-CM | POA: Diagnosis not present

## 2017-09-14 DIAGNOSIS — Z992 Dependence on renal dialysis: Secondary | ICD-10-CM | POA: Diagnosis not present

## 2017-09-14 DIAGNOSIS — D631 Anemia in chronic kidney disease: Secondary | ICD-10-CM | POA: Diagnosis not present

## 2017-09-14 DIAGNOSIS — D509 Iron deficiency anemia, unspecified: Secondary | ICD-10-CM | POA: Diagnosis not present

## 2017-09-14 DIAGNOSIS — N2581 Secondary hyperparathyroidism of renal origin: Secondary | ICD-10-CM | POA: Diagnosis not present

## 2017-09-17 DIAGNOSIS — D631 Anemia in chronic kidney disease: Secondary | ICD-10-CM | POA: Diagnosis not present

## 2017-09-17 DIAGNOSIS — E119 Type 2 diabetes mellitus without complications: Secondary | ICD-10-CM | POA: Diagnosis not present

## 2017-09-17 DIAGNOSIS — S064X1A Epidural hemorrhage with loss of consciousness of 30 minutes or less, initial encounter: Secondary | ICD-10-CM | POA: Diagnosis not present

## 2017-09-17 DIAGNOSIS — S199XXA Unspecified injury of neck, initial encounter: Secondary | ICD-10-CM | POA: Diagnosis not present

## 2017-09-17 DIAGNOSIS — I712 Thoracic aortic aneurysm, without rupture: Secondary | ICD-10-CM | POA: Diagnosis not present

## 2017-09-17 DIAGNOSIS — I700 Atherosclerosis of aorta: Secondary | ICD-10-CM | POA: Diagnosis not present

## 2017-09-17 DIAGNOSIS — S3993XA Unspecified injury of pelvis, initial encounter: Secondary | ICD-10-CM | POA: Diagnosis not present

## 2017-09-17 DIAGNOSIS — R109 Unspecified abdominal pain: Secondary | ICD-10-CM | POA: Diagnosis not present

## 2017-09-17 DIAGNOSIS — R51 Headache: Secondary | ICD-10-CM | POA: Diagnosis not present

## 2017-09-17 DIAGNOSIS — D509 Iron deficiency anemia, unspecified: Secondary | ICD-10-CM | POA: Diagnosis not present

## 2017-09-17 DIAGNOSIS — N2581 Secondary hyperparathyroidism of renal origin: Secondary | ICD-10-CM | POA: Diagnosis not present

## 2017-09-17 DIAGNOSIS — S3991XA Unspecified injury of abdomen, initial encounter: Secondary | ICD-10-CM | POA: Diagnosis not present

## 2017-09-17 DIAGNOSIS — R410 Disorientation, unspecified: Secondary | ICD-10-CM | POA: Diagnosis not present

## 2017-09-17 DIAGNOSIS — R079 Chest pain, unspecified: Secondary | ICD-10-CM | POA: Diagnosis not present

## 2017-09-17 DIAGNOSIS — Z87891 Personal history of nicotine dependence: Secondary | ICD-10-CM | POA: Diagnosis not present

## 2017-09-17 DIAGNOSIS — Z992 Dependence on renal dialysis: Secondary | ICD-10-CM | POA: Diagnosis not present

## 2017-09-17 DIAGNOSIS — I10 Essential (primary) hypertension: Secondary | ICD-10-CM | POA: Diagnosis not present

## 2017-09-17 DIAGNOSIS — R404 Transient alteration of awareness: Secondary | ICD-10-CM | POA: Diagnosis not present

## 2017-09-17 DIAGNOSIS — S299XXA Unspecified injury of thorax, initial encounter: Secondary | ICD-10-CM | POA: Diagnosis not present

## 2017-09-17 DIAGNOSIS — S0990XA Unspecified injury of head, initial encounter: Secondary | ICD-10-CM | POA: Diagnosis not present

## 2017-09-17 DIAGNOSIS — R1030 Lower abdominal pain, unspecified: Secondary | ICD-10-CM | POA: Diagnosis not present

## 2017-09-17 DIAGNOSIS — N186 End stage renal disease: Secondary | ICD-10-CM | POA: Diagnosis not present

## 2017-09-17 DIAGNOSIS — W19XXXA Unspecified fall, initial encounter: Secondary | ICD-10-CM | POA: Diagnosis not present

## 2017-09-17 DIAGNOSIS — Z8673 Personal history of transient ischemic attack (TIA), and cerebral infarction without residual deficits: Secondary | ICD-10-CM | POA: Diagnosis not present

## 2017-09-17 DIAGNOSIS — S30810A Abrasion of lower back and pelvis, initial encounter: Secondary | ICD-10-CM | POA: Diagnosis not present

## 2017-09-19 DIAGNOSIS — N2581 Secondary hyperparathyroidism of renal origin: Secondary | ICD-10-CM | POA: Diagnosis not present

## 2017-09-19 DIAGNOSIS — N186 End stage renal disease: Secondary | ICD-10-CM | POA: Diagnosis not present

## 2017-09-19 DIAGNOSIS — Z992 Dependence on renal dialysis: Secondary | ICD-10-CM | POA: Diagnosis not present

## 2017-09-19 DIAGNOSIS — D509 Iron deficiency anemia, unspecified: Secondary | ICD-10-CM | POA: Diagnosis not present

## 2017-09-19 DIAGNOSIS — D631 Anemia in chronic kidney disease: Secondary | ICD-10-CM | POA: Diagnosis not present

## 2017-09-21 DIAGNOSIS — D631 Anemia in chronic kidney disease: Secondary | ICD-10-CM | POA: Diagnosis not present

## 2017-09-21 DIAGNOSIS — N186 End stage renal disease: Secondary | ICD-10-CM | POA: Diagnosis not present

## 2017-09-21 DIAGNOSIS — D509 Iron deficiency anemia, unspecified: Secondary | ICD-10-CM | POA: Diagnosis not present

## 2017-09-21 DIAGNOSIS — N2581 Secondary hyperparathyroidism of renal origin: Secondary | ICD-10-CM | POA: Diagnosis not present

## 2017-09-21 DIAGNOSIS — Z992 Dependence on renal dialysis: Secondary | ICD-10-CM | POA: Diagnosis not present

## 2017-09-24 DIAGNOSIS — Z992 Dependence on renal dialysis: Secondary | ICD-10-CM | POA: Diagnosis not present

## 2017-09-24 DIAGNOSIS — N2581 Secondary hyperparathyroidism of renal origin: Secondary | ICD-10-CM | POA: Diagnosis not present

## 2017-09-24 DIAGNOSIS — Z23 Encounter for immunization: Secondary | ICD-10-CM | POA: Diagnosis not present

## 2017-09-24 DIAGNOSIS — N186 End stage renal disease: Secondary | ICD-10-CM | POA: Diagnosis not present

## 2017-09-24 DIAGNOSIS — D509 Iron deficiency anemia, unspecified: Secondary | ICD-10-CM | POA: Diagnosis not present

## 2017-09-24 DIAGNOSIS — D631 Anemia in chronic kidney disease: Secondary | ICD-10-CM | POA: Diagnosis not present

## 2017-09-26 DIAGNOSIS — N2581 Secondary hyperparathyroidism of renal origin: Secondary | ICD-10-CM | POA: Diagnosis not present

## 2017-09-26 DIAGNOSIS — E876 Hypokalemia: Secondary | ICD-10-CM | POA: Diagnosis not present

## 2017-09-26 DIAGNOSIS — N186 End stage renal disease: Secondary | ICD-10-CM | POA: Diagnosis not present

## 2017-09-26 DIAGNOSIS — Z23 Encounter for immunization: Secondary | ICD-10-CM | POA: Diagnosis not present

## 2017-09-26 DIAGNOSIS — D509 Iron deficiency anemia, unspecified: Secondary | ICD-10-CM | POA: Diagnosis not present

## 2017-09-26 DIAGNOSIS — Z992 Dependence on renal dialysis: Secondary | ICD-10-CM | POA: Diagnosis not present

## 2017-09-26 DIAGNOSIS — D631 Anemia in chronic kidney disease: Secondary | ICD-10-CM | POA: Diagnosis not present

## 2017-09-27 DIAGNOSIS — E1142 Type 2 diabetes mellitus with diabetic polyneuropathy: Secondary | ICD-10-CM | POA: Diagnosis not present

## 2017-09-27 DIAGNOSIS — L97521 Non-pressure chronic ulcer of other part of left foot limited to breakdown of skin: Secondary | ICD-10-CM | POA: Diagnosis not present

## 2017-09-28 DIAGNOSIS — N2581 Secondary hyperparathyroidism of renal origin: Secondary | ICD-10-CM | POA: Diagnosis not present

## 2017-09-28 DIAGNOSIS — D509 Iron deficiency anemia, unspecified: Secondary | ICD-10-CM | POA: Diagnosis not present

## 2017-09-28 DIAGNOSIS — D631 Anemia in chronic kidney disease: Secondary | ICD-10-CM | POA: Diagnosis not present

## 2017-09-28 DIAGNOSIS — Z992 Dependence on renal dialysis: Secondary | ICD-10-CM | POA: Diagnosis not present

## 2017-09-28 DIAGNOSIS — N186 End stage renal disease: Secondary | ICD-10-CM | POA: Diagnosis not present

## 2017-09-28 DIAGNOSIS — Z23 Encounter for immunization: Secondary | ICD-10-CM | POA: Diagnosis not present

## 2017-09-30 DIAGNOSIS — R0902 Hypoxemia: Secondary | ICD-10-CM | POA: Diagnosis not present

## 2017-09-30 DIAGNOSIS — R0689 Other abnormalities of breathing: Secondary | ICD-10-CM | POA: Diagnosis not present

## 2017-09-30 DIAGNOSIS — R0789 Other chest pain: Secondary | ICD-10-CM | POA: Diagnosis not present

## 2017-09-30 DIAGNOSIS — R079 Chest pain, unspecified: Secondary | ICD-10-CM | POA: Diagnosis not present

## 2017-10-01 DIAGNOSIS — N186 End stage renal disease: Secondary | ICD-10-CM | POA: Diagnosis not present

## 2017-10-01 DIAGNOSIS — D509 Iron deficiency anemia, unspecified: Secondary | ICD-10-CM | POA: Diagnosis not present

## 2017-10-01 DIAGNOSIS — Z992 Dependence on renal dialysis: Secondary | ICD-10-CM | POA: Diagnosis not present

## 2017-10-01 DIAGNOSIS — Z23 Encounter for immunization: Secondary | ICD-10-CM | POA: Diagnosis not present

## 2017-10-01 DIAGNOSIS — N2581 Secondary hyperparathyroidism of renal origin: Secondary | ICD-10-CM | POA: Diagnosis not present

## 2017-10-01 DIAGNOSIS — D631 Anemia in chronic kidney disease: Secondary | ICD-10-CM | POA: Diagnosis not present

## 2017-10-03 DIAGNOSIS — D509 Iron deficiency anemia, unspecified: Secondary | ICD-10-CM | POA: Diagnosis not present

## 2017-10-03 DIAGNOSIS — Z992 Dependence on renal dialysis: Secondary | ICD-10-CM | POA: Diagnosis not present

## 2017-10-03 DIAGNOSIS — D631 Anemia in chronic kidney disease: Secondary | ICD-10-CM | POA: Diagnosis not present

## 2017-10-03 DIAGNOSIS — Z23 Encounter for immunization: Secondary | ICD-10-CM | POA: Diagnosis not present

## 2017-10-03 DIAGNOSIS — N186 End stage renal disease: Secondary | ICD-10-CM | POA: Diagnosis not present

## 2017-10-03 DIAGNOSIS — N2581 Secondary hyperparathyroidism of renal origin: Secondary | ICD-10-CM | POA: Diagnosis not present

## 2017-10-05 DIAGNOSIS — D509 Iron deficiency anemia, unspecified: Secondary | ICD-10-CM | POA: Diagnosis not present

## 2017-10-05 DIAGNOSIS — N186 End stage renal disease: Secondary | ICD-10-CM | POA: Diagnosis not present

## 2017-10-05 DIAGNOSIS — Z23 Encounter for immunization: Secondary | ICD-10-CM | POA: Diagnosis not present

## 2017-10-05 DIAGNOSIS — Z992 Dependence on renal dialysis: Secondary | ICD-10-CM | POA: Diagnosis not present

## 2017-10-05 DIAGNOSIS — D631 Anemia in chronic kidney disease: Secondary | ICD-10-CM | POA: Diagnosis not present

## 2017-10-05 DIAGNOSIS — N2581 Secondary hyperparathyroidism of renal origin: Secondary | ICD-10-CM | POA: Diagnosis not present

## 2017-10-08 DIAGNOSIS — N2581 Secondary hyperparathyroidism of renal origin: Secondary | ICD-10-CM | POA: Diagnosis not present

## 2017-10-08 DIAGNOSIS — Z992 Dependence on renal dialysis: Secondary | ICD-10-CM | POA: Diagnosis not present

## 2017-10-08 DIAGNOSIS — Z23 Encounter for immunization: Secondary | ICD-10-CM | POA: Diagnosis not present

## 2017-10-08 DIAGNOSIS — D631 Anemia in chronic kidney disease: Secondary | ICD-10-CM | POA: Diagnosis not present

## 2017-10-08 DIAGNOSIS — N186 End stage renal disease: Secondary | ICD-10-CM | POA: Diagnosis not present

## 2017-10-08 DIAGNOSIS — D509 Iron deficiency anemia, unspecified: Secondary | ICD-10-CM | POA: Diagnosis not present

## 2017-10-09 DIAGNOSIS — Z79899 Other long term (current) drug therapy: Secondary | ICD-10-CM | POA: Diagnosis not present

## 2017-10-09 DIAGNOSIS — W01198A Fall on same level from slipping, tripping and stumbling with subsequent striking against other object, initial encounter: Secondary | ICD-10-CM | POA: Diagnosis not present

## 2017-10-09 DIAGNOSIS — Z8673 Personal history of transient ischemic attack (TIA), and cerebral infarction without residual deficits: Secondary | ICD-10-CM | POA: Diagnosis not present

## 2017-10-09 DIAGNOSIS — I739 Peripheral vascular disease, unspecified: Secondary | ICD-10-CM | POA: Diagnosis not present

## 2017-10-09 DIAGNOSIS — Z880 Allergy status to penicillin: Secondary | ICD-10-CM | POA: Diagnosis not present

## 2017-10-09 DIAGNOSIS — S199XXA Unspecified injury of neck, initial encounter: Secondary | ICD-10-CM | POA: Diagnosis not present

## 2017-10-09 DIAGNOSIS — S0990XA Unspecified injury of head, initial encounter: Secondary | ICD-10-CM | POA: Diagnosis not present

## 2017-10-09 DIAGNOSIS — I959 Hypotension, unspecified: Secondary | ICD-10-CM | POA: Diagnosis not present

## 2017-10-09 DIAGNOSIS — S51011A Laceration without foreign body of right elbow, initial encounter: Secondary | ICD-10-CM | POA: Diagnosis not present

## 2017-10-09 DIAGNOSIS — M542 Cervicalgia: Secondary | ICD-10-CM | POA: Diagnosis not present

## 2017-10-09 DIAGNOSIS — F0390 Unspecified dementia without behavioral disturbance: Secondary | ICD-10-CM | POA: Diagnosis not present

## 2017-10-09 DIAGNOSIS — E119 Type 2 diabetes mellitus without complications: Secondary | ICD-10-CM | POA: Diagnosis not present

## 2017-10-09 DIAGNOSIS — Z87891 Personal history of nicotine dependence: Secondary | ICD-10-CM | POA: Diagnosis not present

## 2017-10-09 DIAGNOSIS — I119 Hypertensive heart disease without heart failure: Secondary | ICD-10-CM | POA: Diagnosis not present

## 2017-10-09 DIAGNOSIS — S0081XA Abrasion of other part of head, initial encounter: Secondary | ICD-10-CM | POA: Diagnosis not present

## 2017-10-09 DIAGNOSIS — R51 Headache: Secondary | ICD-10-CM | POA: Diagnosis not present

## 2017-10-09 DIAGNOSIS — Z8249 Family history of ischemic heart disease and other diseases of the circulatory system: Secondary | ICD-10-CM | POA: Diagnosis not present

## 2017-10-09 DIAGNOSIS — S0093XA Contusion of unspecified part of head, initial encounter: Secondary | ICD-10-CM | POA: Diagnosis not present

## 2017-10-09 DIAGNOSIS — Z23 Encounter for immunization: Secondary | ICD-10-CM | POA: Diagnosis not present

## 2017-10-10 DIAGNOSIS — N186 End stage renal disease: Secondary | ICD-10-CM | POA: Diagnosis not present

## 2017-10-10 DIAGNOSIS — Z992 Dependence on renal dialysis: Secondary | ICD-10-CM | POA: Diagnosis not present

## 2017-10-10 DIAGNOSIS — D631 Anemia in chronic kidney disease: Secondary | ICD-10-CM | POA: Diagnosis not present

## 2017-10-10 DIAGNOSIS — Z23 Encounter for immunization: Secondary | ICD-10-CM | POA: Diagnosis not present

## 2017-10-10 DIAGNOSIS — D509 Iron deficiency anemia, unspecified: Secondary | ICD-10-CM | POA: Diagnosis not present

## 2017-10-10 DIAGNOSIS — N2581 Secondary hyperparathyroidism of renal origin: Secondary | ICD-10-CM | POA: Diagnosis not present

## 2017-10-12 DIAGNOSIS — Z992 Dependence on renal dialysis: Secondary | ICD-10-CM | POA: Diagnosis not present

## 2017-10-12 DIAGNOSIS — N2581 Secondary hyperparathyroidism of renal origin: Secondary | ICD-10-CM | POA: Diagnosis not present

## 2017-10-12 DIAGNOSIS — N186 End stage renal disease: Secondary | ICD-10-CM | POA: Diagnosis not present

## 2017-10-12 DIAGNOSIS — Z23 Encounter for immunization: Secondary | ICD-10-CM | POA: Diagnosis not present

## 2017-10-12 DIAGNOSIS — D509 Iron deficiency anemia, unspecified: Secondary | ICD-10-CM | POA: Diagnosis not present

## 2017-10-12 DIAGNOSIS — D631 Anemia in chronic kidney disease: Secondary | ICD-10-CM | POA: Diagnosis not present

## 2017-10-15 DIAGNOSIS — Z23 Encounter for immunization: Secondary | ICD-10-CM | POA: Diagnosis not present

## 2017-10-15 DIAGNOSIS — Z794 Long term (current) use of insulin: Secondary | ICD-10-CM | POA: Diagnosis not present

## 2017-10-15 DIAGNOSIS — I259 Chronic ischemic heart disease, unspecified: Secondary | ICD-10-CM | POA: Diagnosis not present

## 2017-10-15 DIAGNOSIS — Z992 Dependence on renal dialysis: Secondary | ICD-10-CM | POA: Diagnosis not present

## 2017-10-15 DIAGNOSIS — E119 Type 2 diabetes mellitus without complications: Secondary | ICD-10-CM | POA: Diagnosis not present

## 2017-10-15 DIAGNOSIS — D509 Iron deficiency anemia, unspecified: Secondary | ICD-10-CM | POA: Diagnosis not present

## 2017-10-15 DIAGNOSIS — N186 End stage renal disease: Secondary | ICD-10-CM | POA: Diagnosis not present

## 2017-10-15 DIAGNOSIS — N2581 Secondary hyperparathyroidism of renal origin: Secondary | ICD-10-CM | POA: Diagnosis not present

## 2017-10-15 DIAGNOSIS — D631 Anemia in chronic kidney disease: Secondary | ICD-10-CM | POA: Diagnosis not present

## 2017-10-17 DIAGNOSIS — Z23 Encounter for immunization: Secondary | ICD-10-CM | POA: Diagnosis not present

## 2017-10-17 DIAGNOSIS — N2581 Secondary hyperparathyroidism of renal origin: Secondary | ICD-10-CM | POA: Diagnosis not present

## 2017-10-17 DIAGNOSIS — D631 Anemia in chronic kidney disease: Secondary | ICD-10-CM | POA: Diagnosis not present

## 2017-10-17 DIAGNOSIS — Z992 Dependence on renal dialysis: Secondary | ICD-10-CM | POA: Diagnosis not present

## 2017-10-17 DIAGNOSIS — D509 Iron deficiency anemia, unspecified: Secondary | ICD-10-CM | POA: Diagnosis not present

## 2017-10-17 DIAGNOSIS — N186 End stage renal disease: Secondary | ICD-10-CM | POA: Diagnosis not present

## 2017-10-18 DIAGNOSIS — L97521 Non-pressure chronic ulcer of other part of left foot limited to breakdown of skin: Secondary | ICD-10-CM | POA: Diagnosis not present

## 2017-10-19 DIAGNOSIS — N2581 Secondary hyperparathyroidism of renal origin: Secondary | ICD-10-CM | POA: Diagnosis not present

## 2017-10-19 DIAGNOSIS — Z992 Dependence on renal dialysis: Secondary | ICD-10-CM | POA: Diagnosis not present

## 2017-10-19 DIAGNOSIS — D509 Iron deficiency anemia, unspecified: Secondary | ICD-10-CM | POA: Diagnosis not present

## 2017-10-19 DIAGNOSIS — D631 Anemia in chronic kidney disease: Secondary | ICD-10-CM | POA: Diagnosis not present

## 2017-10-19 DIAGNOSIS — Z23 Encounter for immunization: Secondary | ICD-10-CM | POA: Diagnosis not present

## 2017-10-19 DIAGNOSIS — N186 End stage renal disease: Secondary | ICD-10-CM | POA: Diagnosis not present

## 2017-10-22 DIAGNOSIS — L97521 Non-pressure chronic ulcer of other part of left foot limited to breakdown of skin: Secondary | ICD-10-CM | POA: Diagnosis not present

## 2017-10-22 DIAGNOSIS — L97528 Non-pressure chronic ulcer of other part of left foot with other specified severity: Secondary | ICD-10-CM | POA: Diagnosis not present

## 2017-10-22 DIAGNOSIS — N186 End stage renal disease: Secondary | ICD-10-CM | POA: Diagnosis not present

## 2017-10-22 DIAGNOSIS — L97522 Non-pressure chronic ulcer of other part of left foot with fat layer exposed: Secondary | ICD-10-CM | POA: Diagnosis not present

## 2017-10-22 DIAGNOSIS — Z23 Encounter for immunization: Secondary | ICD-10-CM | POA: Diagnosis not present

## 2017-10-22 DIAGNOSIS — Z992 Dependence on renal dialysis: Secondary | ICD-10-CM | POA: Diagnosis not present

## 2017-10-22 DIAGNOSIS — Z4800 Encounter for change or removal of nonsurgical wound dressing: Secondary | ICD-10-CM | POA: Diagnosis not present

## 2017-10-22 DIAGNOSIS — D631 Anemia in chronic kidney disease: Secondary | ICD-10-CM | POA: Diagnosis not present

## 2017-10-22 DIAGNOSIS — S90422D Blister (nonthermal), left great toe, subsequent encounter: Secondary | ICD-10-CM | POA: Diagnosis not present

## 2017-10-22 DIAGNOSIS — E1122 Type 2 diabetes mellitus with diabetic chronic kidney disease: Secondary | ICD-10-CM | POA: Diagnosis not present

## 2017-10-22 DIAGNOSIS — D509 Iron deficiency anemia, unspecified: Secondary | ICD-10-CM | POA: Diagnosis not present

## 2017-10-22 DIAGNOSIS — Z79899 Other long term (current) drug therapy: Secondary | ICD-10-CM | POA: Diagnosis not present

## 2017-10-22 DIAGNOSIS — E11621 Type 2 diabetes mellitus with foot ulcer: Secondary | ICD-10-CM | POA: Diagnosis not present

## 2017-10-22 DIAGNOSIS — N2581 Secondary hyperparathyroidism of renal origin: Secondary | ICD-10-CM | POA: Diagnosis not present

## 2017-10-24 DIAGNOSIS — D631 Anemia in chronic kidney disease: Secondary | ICD-10-CM | POA: Diagnosis not present

## 2017-10-24 DIAGNOSIS — N2581 Secondary hyperparathyroidism of renal origin: Secondary | ICD-10-CM | POA: Diagnosis not present

## 2017-10-24 DIAGNOSIS — Z992 Dependence on renal dialysis: Secondary | ICD-10-CM | POA: Diagnosis not present

## 2017-10-24 DIAGNOSIS — N186 End stage renal disease: Secondary | ICD-10-CM | POA: Diagnosis not present

## 2017-10-24 DIAGNOSIS — D509 Iron deficiency anemia, unspecified: Secondary | ICD-10-CM | POA: Diagnosis not present

## 2017-10-25 DIAGNOSIS — E11621 Type 2 diabetes mellitus with foot ulcer: Secondary | ICD-10-CM | POA: Diagnosis not present

## 2017-10-25 DIAGNOSIS — E1122 Type 2 diabetes mellitus with diabetic chronic kidney disease: Secondary | ICD-10-CM | POA: Diagnosis not present

## 2017-10-25 DIAGNOSIS — L97522 Non-pressure chronic ulcer of other part of left foot with fat layer exposed: Secondary | ICD-10-CM | POA: Diagnosis not present

## 2017-10-25 DIAGNOSIS — S90422D Blister (nonthermal), left great toe, subsequent encounter: Secondary | ICD-10-CM | POA: Diagnosis not present

## 2017-10-25 DIAGNOSIS — L97521 Non-pressure chronic ulcer of other part of left foot limited to breakdown of skin: Secondary | ICD-10-CM | POA: Diagnosis not present

## 2017-10-25 DIAGNOSIS — L97528 Non-pressure chronic ulcer of other part of left foot with other specified severity: Secondary | ICD-10-CM | POA: Diagnosis not present

## 2017-10-26 DIAGNOSIS — E1122 Type 2 diabetes mellitus with diabetic chronic kidney disease: Secondary | ICD-10-CM | POA: Diagnosis not present

## 2017-10-26 DIAGNOSIS — L97522 Non-pressure chronic ulcer of other part of left foot with fat layer exposed: Secondary | ICD-10-CM | POA: Diagnosis not present

## 2017-10-26 DIAGNOSIS — Z992 Dependence on renal dialysis: Secondary | ICD-10-CM | POA: Diagnosis not present

## 2017-10-26 DIAGNOSIS — L97521 Non-pressure chronic ulcer of other part of left foot limited to breakdown of skin: Secondary | ICD-10-CM | POA: Diagnosis not present

## 2017-10-26 DIAGNOSIS — L97528 Non-pressure chronic ulcer of other part of left foot with other specified severity: Secondary | ICD-10-CM | POA: Diagnosis not present

## 2017-10-26 DIAGNOSIS — E11621 Type 2 diabetes mellitus with foot ulcer: Secondary | ICD-10-CM | POA: Diagnosis not present

## 2017-10-26 DIAGNOSIS — S90422D Blister (nonthermal), left great toe, subsequent encounter: Secondary | ICD-10-CM | POA: Diagnosis not present

## 2017-10-26 DIAGNOSIS — N186 End stage renal disease: Secondary | ICD-10-CM | POA: Diagnosis not present

## 2017-10-26 DIAGNOSIS — D631 Anemia in chronic kidney disease: Secondary | ICD-10-CM | POA: Diagnosis not present

## 2017-10-26 DIAGNOSIS — N2581 Secondary hyperparathyroidism of renal origin: Secondary | ICD-10-CM | POA: Diagnosis not present

## 2017-10-26 DIAGNOSIS — D509 Iron deficiency anemia, unspecified: Secondary | ICD-10-CM | POA: Diagnosis not present

## 2017-10-29 DIAGNOSIS — D631 Anemia in chronic kidney disease: Secondary | ICD-10-CM | POA: Diagnosis not present

## 2017-10-29 DIAGNOSIS — Z992 Dependence on renal dialysis: Secondary | ICD-10-CM | POA: Diagnosis not present

## 2017-10-29 DIAGNOSIS — N186 End stage renal disease: Secondary | ICD-10-CM | POA: Diagnosis not present

## 2017-10-29 DIAGNOSIS — N2581 Secondary hyperparathyroidism of renal origin: Secondary | ICD-10-CM | POA: Diagnosis not present

## 2017-10-29 DIAGNOSIS — D509 Iron deficiency anemia, unspecified: Secondary | ICD-10-CM | POA: Diagnosis not present

## 2017-10-30 DIAGNOSIS — S90422D Blister (nonthermal), left great toe, subsequent encounter: Secondary | ICD-10-CM | POA: Diagnosis not present

## 2017-10-30 DIAGNOSIS — L97521 Non-pressure chronic ulcer of other part of left foot limited to breakdown of skin: Secondary | ICD-10-CM | POA: Diagnosis not present

## 2017-10-30 DIAGNOSIS — L97528 Non-pressure chronic ulcer of other part of left foot with other specified severity: Secondary | ICD-10-CM | POA: Diagnosis not present

## 2017-10-30 DIAGNOSIS — L97522 Non-pressure chronic ulcer of other part of left foot with fat layer exposed: Secondary | ICD-10-CM | POA: Diagnosis not present

## 2017-10-30 DIAGNOSIS — E1122 Type 2 diabetes mellitus with diabetic chronic kidney disease: Secondary | ICD-10-CM | POA: Diagnosis not present

## 2017-10-30 DIAGNOSIS — E11621 Type 2 diabetes mellitus with foot ulcer: Secondary | ICD-10-CM | POA: Diagnosis not present

## 2017-10-31 DIAGNOSIS — D509 Iron deficiency anemia, unspecified: Secondary | ICD-10-CM | POA: Diagnosis not present

## 2017-10-31 DIAGNOSIS — N2581 Secondary hyperparathyroidism of renal origin: Secondary | ICD-10-CM | POA: Diagnosis not present

## 2017-10-31 DIAGNOSIS — N186 End stage renal disease: Secondary | ICD-10-CM | POA: Diagnosis not present

## 2017-10-31 DIAGNOSIS — Z992 Dependence on renal dialysis: Secondary | ICD-10-CM | POA: Diagnosis not present

## 2017-10-31 DIAGNOSIS — D631 Anemia in chronic kidney disease: Secondary | ICD-10-CM | POA: Diagnosis not present

## 2017-11-01 DIAGNOSIS — E11621 Type 2 diabetes mellitus with foot ulcer: Secondary | ICD-10-CM | POA: Diagnosis not present

## 2017-11-01 DIAGNOSIS — L97528 Non-pressure chronic ulcer of other part of left foot with other specified severity: Secondary | ICD-10-CM | POA: Diagnosis not present

## 2017-11-01 DIAGNOSIS — S90422D Blister (nonthermal), left great toe, subsequent encounter: Secondary | ICD-10-CM | POA: Diagnosis not present

## 2017-11-01 DIAGNOSIS — L97522 Non-pressure chronic ulcer of other part of left foot with fat layer exposed: Secondary | ICD-10-CM | POA: Diagnosis not present

## 2017-11-01 DIAGNOSIS — E1122 Type 2 diabetes mellitus with diabetic chronic kidney disease: Secondary | ICD-10-CM | POA: Diagnosis not present

## 2017-11-01 DIAGNOSIS — L97521 Non-pressure chronic ulcer of other part of left foot limited to breakdown of skin: Secondary | ICD-10-CM | POA: Diagnosis not present

## 2017-11-02 ENCOUNTER — Emergency Department (HOSPITAL_COMMUNITY)
Admission: EM | Admit: 2017-11-02 | Discharge: 2017-11-02 | Disposition: A | Discharge status: Home / Self Care | Payer: Medicare Other | Attending: Emergency Medicine | Admitting: Emergency Medicine

## 2017-11-02 ENCOUNTER — Other Ambulatory Visit: Payer: Self-pay

## 2017-11-02 ENCOUNTER — Encounter (HOSPITAL_COMMUNITY): Payer: Self-pay | Admitting: Emergency Medicine

## 2017-11-02 DIAGNOSIS — N186 End stage renal disease: Secondary | ICD-10-CM | POA: Diagnosis not present

## 2017-11-02 DIAGNOSIS — Z7982 Long term (current) use of aspirin: Secondary | ICD-10-CM | POA: Insufficient documentation

## 2017-11-02 DIAGNOSIS — L270 Generalized skin eruption due to drugs and medicaments taken internally: Secondary | ICD-10-CM | POA: Diagnosis not present

## 2017-11-02 DIAGNOSIS — I120 Hypertensive chronic kidney disease with stage 5 chronic kidney disease or end stage renal disease: Secondary | ICD-10-CM | POA: Diagnosis not present

## 2017-11-02 DIAGNOSIS — R21 Rash and other nonspecific skin eruption: Secondary | ICD-10-CM | POA: Diagnosis present

## 2017-11-02 DIAGNOSIS — Z79899 Other long term (current) drug therapy: Secondary | ICD-10-CM | POA: Insufficient documentation

## 2017-11-02 DIAGNOSIS — D631 Anemia in chronic kidney disease: Secondary | ICD-10-CM | POA: Diagnosis not present

## 2017-11-02 DIAGNOSIS — D509 Iron deficiency anemia, unspecified: Secondary | ICD-10-CM | POA: Diagnosis not present

## 2017-11-02 DIAGNOSIS — E1122 Type 2 diabetes mellitus with diabetic chronic kidney disease: Secondary | ICD-10-CM | POA: Insufficient documentation

## 2017-11-02 DIAGNOSIS — Z87891 Personal history of nicotine dependence: Secondary | ICD-10-CM | POA: Diagnosis not present

## 2017-11-02 DIAGNOSIS — N2581 Secondary hyperparathyroidism of renal origin: Secondary | ICD-10-CM | POA: Diagnosis not present

## 2017-11-02 DIAGNOSIS — Z992 Dependence on renal dialysis: Secondary | ICD-10-CM | POA: Diagnosis not present

## 2017-11-02 MED ORDER — DOXYCYCLINE HYCLATE 100 MG PO CAPS: 100.0000 mg | ORAL_CAPSULE | Freq: Two times a day (BID) | ORAL | 0 refills | Status: AC

## 2017-11-02 NOTE — ED Provider Notes (Signed)
[facility] EMERGENCY DEPARTMENT Provider Note   CSN: [id number] Arrival date & time: 11/02/17  1017  Level 5 caveat dementia   History   Chief Complaint Chief Complaint  Patient presents with  . Rash    HPI Wayne Hopkins is a 77 y.o. male.  HPI Patient with use rash predominantly on his arms bilaterally, and swollen right arm for the past 2 days.  His wife reports the rash on his back has improved since yesterday.  Patient states the rash is itchy.  He denies pain anywhere.  He has been on Cipro for the past 1 week due to affection in toes of left foot.  His wife also reports that his right arm is swollen since dialysis nurse had some trouble accessing his dialysis fistula 2 days ago.  No other associated symptoms.  Patient denies feeling ill.  Denies pain anywhere. Past Medical History:  Diagnosis Date  . Atrial flutter ([place])   . DDD (degenerative disc disease), lumbar   . Diabetes mellitus without complication ([place])   . ESRD (end stage renal disease) ([place])   . Hernia, inguinal, right   . Hyperlipidemia   . Hypertension   . Long-term use of immunosuppressant medication   . Lumbar spondylosis   . Peripheral edema   . Stroke [facility])     Patient Active Problem List   Diagnosis Date Noted  . Loose stools 06/15/2016  . Colon adenomas   . ESRD on dialysis ([place]) 01/13/2016  . Anemia in chronic kidney disease 01/13/2016  . Hyperlipidemia 01/13/2016  . Essential hypertension 01/13/2016  . Type 2 diabetes mellitus ([place]) 01/13/2016    Past Surgical History:  Procedure Laterality Date  . BACK SURGERY    . BIOPSY  03/14/2016   Procedure: BIOPSY;  Surgeon: Danie Binder, MD;  Location: [facility] ENDO SUITE;  Service: Endoscopy;;  colon  . CHOLECYSTECTOMY    . COLONOSCOPY N/A 03/14/2016   Procedure: COLONOSCOPY;  Surgeon: Danie Binder, MD;  Location: [facility] ENDO SUITE;  Service: Endoscopy;  Laterality: N/A;  2:45pm - pt knows to arrive at 11:45  . KIDNEY TRANSPLANT  2005  .  POLYPECTOMY  03/14/2016   Procedure: POLYPECTOMY;  Surgeon: Danie Binder, MD;  Location: [facility] ENDO SUITE;  Service: Endoscopy;;  colon; ileal        Home Medications    Prior to Admission medications   Medication Sig Start Date End Date Taking? Authorizing Provider  aspirin EC 81 MG tablet Take 81 mg by mouth daily. Doesn't take everyday 11/03/14   [provider]  Cholecalciferol (VITAMIN D-1000 MAX ST) 1000 units tablet Take 2,000 Units by mouth daily.  05/24/15   [provider]  colestipol (COLESTID) 1 g tablet TAKE 1 TABLET TWICE DAILY TO PREVENT DIARRHEA. 03/26/17   Annitta Needs, NP  gabapentin (NEURONTIN) 100 MG capsule Take 100 mg by mouth at bedtime.    [provider]  lipase/protease/amylase (CREON) 36000 UNITS CPEP capsule 2 PO WITH MEALS AND ONE WITH SNACKS 06/15/16   Fields, Sandi L, MD  loperamide (IMODIUM) 2 MG capsule TAKE 1 CAPSULE EVERY 2 TO 3 HOURS IF NEEDED TO CONTROL STOOL. NO MORETHAN 4 CAPSULES DAILY. 03/26/17   Annitta Needs, NP  midodrine (PROAMATINE) 10 MG tablet Take 10 mg by mouth. Take 15 mins before dialysis, 3 times per week to stabilize blood pressure prior to dialysis    [provider]  Omega-3 Fatty Acids (FISH OIL) 1200 MG CAPS Take  1,200 mg by mouth 2 (two) times daily.     [provider]  predniSONE (DELTASONE) 10 MG tablet Take 5 mg by mouth every other day.  09/28/15   [provider]  sevelamer carbonate (RENVELA) 800 MG tablet Take 2,400 mg by mouth 3 (three) times daily with meals. Takes [phone] with snacks    [provider]  sodium bicarbonate 650 MG tablet Take 1,300 mg by mouth 2 (two) times daily. 12/03/13   [provider]    Family History Family History  Problem Relation Age of Onset  . Stroke Mother   . Heart attack Father   . Kidney failure Sister   . Kidney failure Other     Social History Social History   Tobacco Use  . Smoking status: Former Smoker     Packs/day: 1.50    Types: Cigarettes    Last attempt to quit: 01/23/1984    Years since quitting: 33.8  . Smokeless tobacco: Never Used  . Tobacco comment: Quit x 33 years  Substance Use Topics  . Alcohol use: No  . Drug use: No     Allergies   Penicillins and Tape   Review of Systems Review of Systems  Unable to perform ROS: Dementia  Skin: Positive for rash and wound.       Wound on toes of left foot  Allergic/Immunologic: Positive for immunocompromised state.     Physical Exam Updated Vital Signs BP 140/73 (BP Location: Left Arm)   Pulse (!) 58   Temp 98.3 F (36.8 C) (Oral)   Resp 20   Ht 5\' 10"  (1.778 m)   Wt 59.4 kg   SpO2 100%   BMI 18.80 kg/m   Physical Exam  Constitutional: No distress.  Chronically ill-appearing  HENT:  Head: Normocephalic and atraumatic.  No mucosal lesion  Eyes: Pupils are equal, round, and reactive to light. Conjunctivae are normal.  Neck: Neck supple. No tracheal deviation present. No thyromegaly present.  Cardiovascular: Normal rate and regular rhythm.  No murmur heard. Pulmonary/Chest: Effort normal and breath sounds normal.  Abdominal: Soft. Bowel sounds are normal. He exhibits no distension. There is no tenderness.  Musculoskeletal: Normal range of motion. He exhibits no edema or tenderness.  Right upper extremity forearm is swollen.,  Not tender.  Compartments are soft.  Radial pulse 2+.  Good capillary refill.  Dialysis fistula with good thrill.  Left lower extremity there is an open wound on the plantar surface of the great toe, chronic appearing wounds on plantar surfaces of second and fourth toes.  No surrounding redness no drainage.  Neurological: He is alert. Coordination normal.  Skin: Skin is warm and dry. Rash noted.  There is a thickened reddish rash on bilateral forearms and reddish rash on back which is less pronounced  Psychiatric: He has a normal mood and affect.  Nursing note and vitals reviewed.    ED  Treatments / Results  Labs (all labs ordered are listed, but only abnormal results are displayed) Labs Reviewed - No data to display  EKG None  Radiology No results found.  Procedures Procedures (including critical care time)  Medications Ordered in ED Medications - No data to display   Initial Impression / Assessment and Plan / ED Course  I have reviewed the triage vital signs and the nursing notes.  Pertinent labs & imaging results that were available during my care of the patient were reviewed by me and considered in my medical decision making (  see chart for details).     I suspect the patient has drug rash from Cipro.  Wife stopped Cipro 2 days ago and states the rash on his back is better.  I suspect that the swelling in his forearm is not due to a blood clot or infection but rather due to extravasation of fluid.  His left foot does not appear to be grossly infected however we will cover him with doxycycline, in light of his immune compromised state.  I spoke with dialysis nurse.  Dialysis staff is okay with accessing his fistula today, they wanted him checked for cellulitis which I do not believe he has he will go to dialysis immediately upon leaving here  Final Clinical Impressions(s) / ED Diagnoses  Diagnosis drug rash Final diagnoses:  None    ED Discharge Orders    None       Orlie Dakin, MD 11/02/17 1116

## 2017-11-02 NOTE — ED Triage Notes (Addendum)
Patient is dialysis patient and last treatment was Wednesday. Patient complaining of swelling and redness to right arm, patient has fistula in right arm. Patient also complaining of generalized rash starting 2 days ago.

## 2017-11-02 NOTE — Discharge Instructions (Signed)
Call Mr. Wayne Hopkins foot doctor today or Monday, 11/05/2017 to tell him that we have changed the antibiotic.  It is okay to go to dialysis.  Please take Wayne Hopkins to dialysis upon leaving here.  He can start the new antibiotic, doxycycline today after dialysis.  Not give him any more Cipro

## 2017-11-03 DIAGNOSIS — N186 End stage renal disease: Secondary | ICD-10-CM | POA: Diagnosis present

## 2017-11-03 DIAGNOSIS — L299 Pruritus, unspecified: Secondary | ICD-10-CM | POA: Diagnosis not present

## 2017-11-03 DIAGNOSIS — I5032 Chronic diastolic (congestive) heart failure: Secondary | ICD-10-CM | POA: Diagnosis present

## 2017-11-03 DIAGNOSIS — D631 Anemia in chronic kidney disease: Secondary | ICD-10-CM | POA: Diagnosis not present

## 2017-11-03 DIAGNOSIS — Z955 Presence of coronary angioplasty implant and graft: Secondary | ICD-10-CM | POA: Diagnosis not present

## 2017-11-03 DIAGNOSIS — L03113 Cellulitis of right upper limb: Secondary | ICD-10-CM | POA: Diagnosis present

## 2017-11-03 DIAGNOSIS — E1129 Type 2 diabetes mellitus with other diabetic kidney complication: Secondary | ICD-10-CM | POA: Diagnosis not present

## 2017-11-03 DIAGNOSIS — I132 Hypertensive heart and chronic kidney disease with heart failure and with stage 5 chronic kidney disease, or end stage renal disease: Secondary | ICD-10-CM | POA: Diagnosis present

## 2017-11-03 DIAGNOSIS — Z992 Dependence on renal dialysis: Secondary | ICD-10-CM | POA: Diagnosis not present

## 2017-11-03 DIAGNOSIS — T7840XA Allergy, unspecified, initial encounter: Secondary | ICD-10-CM | POA: Diagnosis not present

## 2017-11-03 DIAGNOSIS — Z79899 Other long term (current) drug therapy: Secondary | ICD-10-CM | POA: Diagnosis not present

## 2017-11-03 DIAGNOSIS — E1122 Type 2 diabetes mellitus with diabetic chronic kidney disease: Secondary | ICD-10-CM | POA: Diagnosis present

## 2017-11-03 DIAGNOSIS — E440 Moderate protein-calorie malnutrition: Secondary | ICD-10-CM | POA: Diagnosis present

## 2017-11-03 DIAGNOSIS — N2581 Secondary hyperparathyroidism of renal origin: Secondary | ICD-10-CM | POA: Diagnosis not present

## 2017-11-03 DIAGNOSIS — T782XXA Anaphylactic shock, unspecified, initial encounter: Secondary | ICD-10-CM | POA: Diagnosis not present

## 2017-11-03 DIAGNOSIS — I482 Chronic atrial fibrillation, unspecified: Secondary | ICD-10-CM | POA: Diagnosis present

## 2017-11-03 DIAGNOSIS — Z794 Long term (current) use of insulin: Secondary | ICD-10-CM | POA: Diagnosis not present

## 2017-11-03 DIAGNOSIS — I2510 Atherosclerotic heart disease of native coronary artery without angina pectoris: Secondary | ICD-10-CM | POA: Diagnosis present

## 2017-11-03 DIAGNOSIS — L03114 Cellulitis of left upper limb: Secondary | ICD-10-CM | POA: Diagnosis present

## 2017-11-03 DIAGNOSIS — D509 Iron deficiency anemia, unspecified: Secondary | ICD-10-CM | POA: Diagnosis not present

## 2017-11-03 DIAGNOSIS — I4891 Unspecified atrial fibrillation: Secondary | ICD-10-CM | POA: Diagnosis not present

## 2017-11-03 DIAGNOSIS — Z87891 Personal history of nicotine dependence: Secondary | ICD-10-CM | POA: Diagnosis not present

## 2017-11-03 DIAGNOSIS — R21 Rash and other nonspecific skin eruption: Secondary | ICD-10-CM | POA: Diagnosis not present

## 2017-11-03 DIAGNOSIS — Z681 Body mass index (BMI) 19 or less, adult: Secondary | ICD-10-CM | POA: Diagnosis not present

## 2017-11-03 DIAGNOSIS — R609 Edema, unspecified: Secondary | ICD-10-CM | POA: Diagnosis not present

## 2017-11-07 DIAGNOSIS — N186 End stage renal disease: Secondary | ICD-10-CM | POA: Diagnosis not present

## 2017-11-07 DIAGNOSIS — N2581 Secondary hyperparathyroidism of renal origin: Secondary | ICD-10-CM | POA: Diagnosis not present

## 2017-11-07 DIAGNOSIS — Z992 Dependence on renal dialysis: Secondary | ICD-10-CM | POA: Diagnosis not present

## 2017-11-07 DIAGNOSIS — D509 Iron deficiency anemia, unspecified: Secondary | ICD-10-CM | POA: Diagnosis not present

## 2017-11-07 DIAGNOSIS — D631 Anemia in chronic kidney disease: Secondary | ICD-10-CM | POA: Diagnosis not present

## 2017-11-08 DIAGNOSIS — Z79899 Other long term (current) drug therapy: Secondary | ICD-10-CM | POA: Diagnosis not present

## 2017-11-08 DIAGNOSIS — S199XXA Unspecified injury of neck, initial encounter: Secondary | ICD-10-CM | POA: Diagnosis not present

## 2017-11-08 DIAGNOSIS — Z8673 Personal history of transient ischemic attack (TIA), and cerebral infarction without residual deficits: Secondary | ICD-10-CM | POA: Diagnosis not present

## 2017-11-08 DIAGNOSIS — R220 Localized swelling, mass and lump, head: Secondary | ICD-10-CM | POA: Diagnosis not present

## 2017-11-08 DIAGNOSIS — S022XXA Fracture of nasal bones, initial encounter for closed fracture: Secondary | ICD-10-CM | POA: Diagnosis not present

## 2017-11-08 DIAGNOSIS — S0990XA Unspecified injury of head, initial encounter: Secondary | ICD-10-CM | POA: Diagnosis not present

## 2017-11-08 DIAGNOSIS — R109 Unspecified abdominal pain: Secondary | ICD-10-CM | POA: Diagnosis not present

## 2017-11-08 DIAGNOSIS — R51 Headache: Secondary | ICD-10-CM | POA: Diagnosis not present

## 2017-11-08 DIAGNOSIS — E119 Type 2 diabetes mellitus without complications: Secondary | ICD-10-CM | POA: Diagnosis not present

## 2017-11-08 DIAGNOSIS — R0902 Hypoxemia: Secondary | ICD-10-CM | POA: Diagnosis not present

## 2017-11-08 DIAGNOSIS — G319 Degenerative disease of nervous system, unspecified: Secondary | ICD-10-CM | POA: Diagnosis not present

## 2017-11-08 DIAGNOSIS — W19XXXA Unspecified fall, initial encounter: Secondary | ICD-10-CM | POA: Diagnosis not present

## 2017-11-08 DIAGNOSIS — R21 Rash and other nonspecific skin eruption: Secondary | ICD-10-CM | POA: Diagnosis not present

## 2017-11-08 DIAGNOSIS — S3991XA Unspecified injury of abdomen, initial encounter: Secondary | ICD-10-CM | POA: Diagnosis not present

## 2017-11-08 DIAGNOSIS — R457 State of emotional shock and stress, unspecified: Secondary | ICD-10-CM | POA: Diagnosis not present

## 2017-11-08 DIAGNOSIS — W1839XA Other fall on same level, initial encounter: Secondary | ICD-10-CM | POA: Diagnosis not present

## 2017-11-08 DIAGNOSIS — R1013 Epigastric pain: Secondary | ICD-10-CM | POA: Diagnosis not present

## 2017-11-08 DIAGNOSIS — R404 Transient alteration of awareness: Secondary | ICD-10-CM | POA: Diagnosis not present

## 2017-11-08 DIAGNOSIS — M542 Cervicalgia: Secondary | ICD-10-CM | POA: Diagnosis not present

## 2017-11-08 DIAGNOSIS — I10 Essential (primary) hypertension: Secondary | ICD-10-CM | POA: Diagnosis not present

## 2017-11-09 DIAGNOSIS — D631 Anemia in chronic kidney disease: Secondary | ICD-10-CM | POA: Diagnosis not present

## 2017-11-09 DIAGNOSIS — D509 Iron deficiency anemia, unspecified: Secondary | ICD-10-CM | POA: Diagnosis not present

## 2017-11-09 DIAGNOSIS — N2581 Secondary hyperparathyroidism of renal origin: Secondary | ICD-10-CM | POA: Diagnosis not present

## 2017-11-09 DIAGNOSIS — Z992 Dependence on renal dialysis: Secondary | ICD-10-CM | POA: Diagnosis not present

## 2017-11-09 DIAGNOSIS — N186 End stage renal disease: Secondary | ICD-10-CM | POA: Diagnosis not present

## 2017-11-10 DIAGNOSIS — S90422D Blister (nonthermal), left great toe, subsequent encounter: Secondary | ICD-10-CM | POA: Diagnosis not present

## 2017-11-10 DIAGNOSIS — L97528 Non-pressure chronic ulcer of other part of left foot with other specified severity: Secondary | ICD-10-CM | POA: Diagnosis not present

## 2017-11-10 DIAGNOSIS — E1122 Type 2 diabetes mellitus with diabetic chronic kidney disease: Secondary | ICD-10-CM | POA: Diagnosis not present

## 2017-11-10 DIAGNOSIS — L97522 Non-pressure chronic ulcer of other part of left foot with fat layer exposed: Secondary | ICD-10-CM | POA: Diagnosis not present

## 2017-11-10 DIAGNOSIS — L97521 Non-pressure chronic ulcer of other part of left foot limited to breakdown of skin: Secondary | ICD-10-CM | POA: Diagnosis not present

## 2017-11-10 DIAGNOSIS — E11621 Type 2 diabetes mellitus with foot ulcer: Secondary | ICD-10-CM | POA: Diagnosis not present

## 2017-11-12 DIAGNOSIS — D509 Iron deficiency anemia, unspecified: Secondary | ICD-10-CM | POA: Diagnosis not present

## 2017-11-12 DIAGNOSIS — N186 End stage renal disease: Secondary | ICD-10-CM | POA: Diagnosis not present

## 2017-11-12 DIAGNOSIS — D631 Anemia in chronic kidney disease: Secondary | ICD-10-CM | POA: Diagnosis not present

## 2017-11-12 DIAGNOSIS — Z992 Dependence on renal dialysis: Secondary | ICD-10-CM | POA: Diagnosis not present

## 2017-11-12 DIAGNOSIS — N2581 Secondary hyperparathyroidism of renal origin: Secondary | ICD-10-CM | POA: Diagnosis not present

## 2017-11-13 DIAGNOSIS — E11621 Type 2 diabetes mellitus with foot ulcer: Secondary | ICD-10-CM | POA: Diagnosis not present

## 2017-11-13 DIAGNOSIS — E1122 Type 2 diabetes mellitus with diabetic chronic kidney disease: Secondary | ICD-10-CM | POA: Diagnosis not present

## 2017-11-13 DIAGNOSIS — S90422D Blister (nonthermal), left great toe, subsequent encounter: Secondary | ICD-10-CM | POA: Diagnosis not present

## 2017-11-13 DIAGNOSIS — L97521 Non-pressure chronic ulcer of other part of left foot limited to breakdown of skin: Secondary | ICD-10-CM | POA: Diagnosis not present

## 2017-11-13 DIAGNOSIS — L97528 Non-pressure chronic ulcer of other part of left foot with other specified severity: Secondary | ICD-10-CM | POA: Diagnosis not present

## 2017-11-13 DIAGNOSIS — L97522 Non-pressure chronic ulcer of other part of left foot with fat layer exposed: Secondary | ICD-10-CM | POA: Diagnosis not present

## 2017-11-14 DIAGNOSIS — N2581 Secondary hyperparathyroidism of renal origin: Secondary | ICD-10-CM | POA: Diagnosis not present

## 2017-11-14 DIAGNOSIS — N186 End stage renal disease: Secondary | ICD-10-CM | POA: Diagnosis not present

## 2017-11-14 DIAGNOSIS — Z992 Dependence on renal dialysis: Secondary | ICD-10-CM | POA: Diagnosis not present

## 2017-11-14 DIAGNOSIS — D509 Iron deficiency anemia, unspecified: Secondary | ICD-10-CM | POA: Diagnosis not present

## 2017-11-14 DIAGNOSIS — D631 Anemia in chronic kidney disease: Secondary | ICD-10-CM | POA: Diagnosis not present

## 2017-11-15 DIAGNOSIS — L97521 Non-pressure chronic ulcer of other part of left foot limited to breakdown of skin: Secondary | ICD-10-CM | POA: Diagnosis not present

## 2017-11-15 DIAGNOSIS — L97528 Non-pressure chronic ulcer of other part of left foot with other specified severity: Secondary | ICD-10-CM | POA: Diagnosis not present

## 2017-11-15 DIAGNOSIS — E11621 Type 2 diabetes mellitus with foot ulcer: Secondary | ICD-10-CM | POA: Diagnosis not present

## 2017-11-15 DIAGNOSIS — S90422D Blister (nonthermal), left great toe, subsequent encounter: Secondary | ICD-10-CM | POA: Diagnosis not present

## 2017-11-15 DIAGNOSIS — R239 Unspecified skin changes: Secondary | ICD-10-CM | POA: Diagnosis not present

## 2017-11-15 DIAGNOSIS — L97522 Non-pressure chronic ulcer of other part of left foot with fat layer exposed: Secondary | ICD-10-CM | POA: Diagnosis not present

## 2017-11-15 DIAGNOSIS — I10 Essential (primary) hypertension: Secondary | ICD-10-CM | POA: Diagnosis not present

## 2017-11-15 DIAGNOSIS — E1122 Type 2 diabetes mellitus with diabetic chronic kidney disease: Secondary | ICD-10-CM | POA: Diagnosis not present

## 2017-11-16 DIAGNOSIS — Z992 Dependence on renal dialysis: Secondary | ICD-10-CM | POA: Diagnosis not present

## 2017-11-16 DIAGNOSIS — N2581 Secondary hyperparathyroidism of renal origin: Secondary | ICD-10-CM | POA: Diagnosis not present

## 2017-11-16 DIAGNOSIS — N186 End stage renal disease: Secondary | ICD-10-CM | POA: Diagnosis not present

## 2017-11-16 DIAGNOSIS — D509 Iron deficiency anemia, unspecified: Secondary | ICD-10-CM | POA: Diagnosis not present

## 2017-11-16 DIAGNOSIS — D631 Anemia in chronic kidney disease: Secondary | ICD-10-CM | POA: Diagnosis not present

## 2017-11-19 DIAGNOSIS — Z992 Dependence on renal dialysis: Secondary | ICD-10-CM | POA: Diagnosis not present

## 2017-11-19 DIAGNOSIS — N186 End stage renal disease: Secondary | ICD-10-CM | POA: Diagnosis not present

## 2017-11-19 DIAGNOSIS — D509 Iron deficiency anemia, unspecified: Secondary | ICD-10-CM | POA: Diagnosis not present

## 2017-11-19 DIAGNOSIS — N2581 Secondary hyperparathyroidism of renal origin: Secondary | ICD-10-CM | POA: Diagnosis not present

## 2017-11-19 DIAGNOSIS — D631 Anemia in chronic kidney disease: Secondary | ICD-10-CM | POA: Diagnosis not present

## 2017-11-20 DIAGNOSIS — E1122 Type 2 diabetes mellitus with diabetic chronic kidney disease: Secondary | ICD-10-CM | POA: Diagnosis not present

## 2017-11-20 DIAGNOSIS — E11621 Type 2 diabetes mellitus with foot ulcer: Secondary | ICD-10-CM | POA: Diagnosis not present

## 2017-11-20 DIAGNOSIS — L97528 Non-pressure chronic ulcer of other part of left foot with other specified severity: Secondary | ICD-10-CM | POA: Diagnosis not present

## 2017-11-20 DIAGNOSIS — S90422D Blister (nonthermal), left great toe, subsequent encounter: Secondary | ICD-10-CM | POA: Diagnosis not present

## 2017-11-20 DIAGNOSIS — L97521 Non-pressure chronic ulcer of other part of left foot limited to breakdown of skin: Secondary | ICD-10-CM | POA: Diagnosis not present

## 2017-11-20 DIAGNOSIS — L97522 Non-pressure chronic ulcer of other part of left foot with fat layer exposed: Secondary | ICD-10-CM | POA: Diagnosis not present

## 2017-11-21 DIAGNOSIS — D631 Anemia in chronic kidney disease: Secondary | ICD-10-CM | POA: Diagnosis not present

## 2017-11-21 DIAGNOSIS — N2581 Secondary hyperparathyroidism of renal origin: Secondary | ICD-10-CM | POA: Diagnosis not present

## 2017-11-21 DIAGNOSIS — N186 End stage renal disease: Secondary | ICD-10-CM | POA: Diagnosis not present

## 2017-11-21 DIAGNOSIS — D509 Iron deficiency anemia, unspecified: Secondary | ICD-10-CM | POA: Diagnosis not present

## 2017-11-21 DIAGNOSIS — Z992 Dependence on renal dialysis: Secondary | ICD-10-CM | POA: Diagnosis not present

## 2017-11-22 DIAGNOSIS — L97521 Non-pressure chronic ulcer of other part of left foot limited to breakdown of skin: Secondary | ICD-10-CM | POA: Diagnosis not present

## 2017-11-22 DIAGNOSIS — Z992 Dependence on renal dialysis: Secondary | ICD-10-CM | POA: Diagnosis not present

## 2017-11-22 DIAGNOSIS — E11621 Type 2 diabetes mellitus with foot ulcer: Secondary | ICD-10-CM | POA: Diagnosis not present

## 2017-11-22 DIAGNOSIS — L97522 Non-pressure chronic ulcer of other part of left foot with fat layer exposed: Secondary | ICD-10-CM | POA: Diagnosis not present

## 2017-11-22 DIAGNOSIS — S90422D Blister (nonthermal), left great toe, subsequent encounter: Secondary | ICD-10-CM | POA: Diagnosis not present

## 2017-11-22 DIAGNOSIS — E1122 Type 2 diabetes mellitus with diabetic chronic kidney disease: Secondary | ICD-10-CM | POA: Diagnosis not present

## 2017-11-22 DIAGNOSIS — N186 End stage renal disease: Secondary | ICD-10-CM | POA: Diagnosis not present

## 2017-11-22 DIAGNOSIS — D649 Anemia, unspecified: Secondary | ICD-10-CM | POA: Diagnosis not present

## 2017-11-22 DIAGNOSIS — L97528 Non-pressure chronic ulcer of other part of left foot with other specified severity: Secondary | ICD-10-CM | POA: Diagnosis not present

## 2017-11-22 DIAGNOSIS — A0472 Enterocolitis due to Clostridium difficile, not specified as recurrent: Secondary | ICD-10-CM | POA: Diagnosis not present

## 2017-11-22 DIAGNOSIS — E1151 Type 2 diabetes mellitus with diabetic peripheral angiopathy without gangrene: Secondary | ICD-10-CM | POA: Diagnosis not present

## 2017-11-23 DIAGNOSIS — D631 Anemia in chronic kidney disease: Secondary | ICD-10-CM | POA: Diagnosis not present

## 2017-11-23 DIAGNOSIS — N186 End stage renal disease: Secondary | ICD-10-CM | POA: Diagnosis not present

## 2017-11-23 DIAGNOSIS — N2581 Secondary hyperparathyroidism of renal origin: Secondary | ICD-10-CM | POA: Diagnosis not present

## 2017-11-23 DIAGNOSIS — Z992 Dependence on renal dialysis: Secondary | ICD-10-CM | POA: Diagnosis not present

## 2017-11-23 DIAGNOSIS — D509 Iron deficiency anemia, unspecified: Secondary | ICD-10-CM | POA: Diagnosis not present

## 2017-11-26 DIAGNOSIS — D631 Anemia in chronic kidney disease: Secondary | ICD-10-CM | POA: Diagnosis not present

## 2017-11-26 DIAGNOSIS — D509 Iron deficiency anemia, unspecified: Secondary | ICD-10-CM | POA: Diagnosis not present

## 2017-11-26 DIAGNOSIS — Z992 Dependence on renal dialysis: Secondary | ICD-10-CM | POA: Diagnosis not present

## 2017-11-26 DIAGNOSIS — N186 End stage renal disease: Secondary | ICD-10-CM | POA: Diagnosis not present

## 2017-11-26 DIAGNOSIS — N2581 Secondary hyperparathyroidism of renal origin: Secondary | ICD-10-CM | POA: Diagnosis not present

## 2017-11-27 DIAGNOSIS — E11621 Type 2 diabetes mellitus with foot ulcer: Secondary | ICD-10-CM | POA: Diagnosis not present

## 2017-11-27 DIAGNOSIS — L97522 Non-pressure chronic ulcer of other part of left foot with fat layer exposed: Secondary | ICD-10-CM | POA: Diagnosis not present

## 2017-11-27 DIAGNOSIS — L97521 Non-pressure chronic ulcer of other part of left foot limited to breakdown of skin: Secondary | ICD-10-CM | POA: Diagnosis not present

## 2017-11-27 DIAGNOSIS — S90422D Blister (nonthermal), left great toe, subsequent encounter: Secondary | ICD-10-CM | POA: Diagnosis not present

## 2017-11-27 DIAGNOSIS — E1122 Type 2 diabetes mellitus with diabetic chronic kidney disease: Secondary | ICD-10-CM | POA: Diagnosis not present

## 2017-11-27 DIAGNOSIS — L97528 Non-pressure chronic ulcer of other part of left foot with other specified severity: Secondary | ICD-10-CM | POA: Diagnosis not present

## 2017-11-28 DIAGNOSIS — D509 Iron deficiency anemia, unspecified: Secondary | ICD-10-CM | POA: Diagnosis not present

## 2017-11-28 DIAGNOSIS — Z992 Dependence on renal dialysis: Secondary | ICD-10-CM | POA: Diagnosis not present

## 2017-11-28 DIAGNOSIS — D631 Anemia in chronic kidney disease: Secondary | ICD-10-CM | POA: Diagnosis not present

## 2017-11-28 DIAGNOSIS — N2581 Secondary hyperparathyroidism of renal origin: Secondary | ICD-10-CM | POA: Diagnosis not present

## 2017-11-28 DIAGNOSIS — N186 End stage renal disease: Secondary | ICD-10-CM | POA: Diagnosis not present

## 2017-11-29 DIAGNOSIS — E11621 Type 2 diabetes mellitus with foot ulcer: Secondary | ICD-10-CM | POA: Diagnosis not present

## 2017-11-29 DIAGNOSIS — L97528 Non-pressure chronic ulcer of other part of left foot with other specified severity: Secondary | ICD-10-CM | POA: Diagnosis not present

## 2017-11-29 DIAGNOSIS — L97521 Non-pressure chronic ulcer of other part of left foot limited to breakdown of skin: Secondary | ICD-10-CM | POA: Diagnosis not present

## 2017-11-29 DIAGNOSIS — S90422D Blister (nonthermal), left great toe, subsequent encounter: Secondary | ICD-10-CM | POA: Diagnosis not present

## 2017-11-29 DIAGNOSIS — L97522 Non-pressure chronic ulcer of other part of left foot with fat layer exposed: Secondary | ICD-10-CM | POA: Diagnosis not present

## 2017-11-29 DIAGNOSIS — E1122 Type 2 diabetes mellitus with diabetic chronic kidney disease: Secondary | ICD-10-CM | POA: Diagnosis not present

## 2017-11-30 DIAGNOSIS — D631 Anemia in chronic kidney disease: Secondary | ICD-10-CM | POA: Diagnosis not present

## 2017-11-30 DIAGNOSIS — Z992 Dependence on renal dialysis: Secondary | ICD-10-CM | POA: Diagnosis not present

## 2017-11-30 DIAGNOSIS — N2581 Secondary hyperparathyroidism of renal origin: Secondary | ICD-10-CM | POA: Diagnosis not present

## 2017-11-30 DIAGNOSIS — N186 End stage renal disease: Secondary | ICD-10-CM | POA: Diagnosis not present

## 2017-11-30 DIAGNOSIS — D509 Iron deficiency anemia, unspecified: Secondary | ICD-10-CM | POA: Diagnosis not present

## 2017-12-03 DIAGNOSIS — N2581 Secondary hyperparathyroidism of renal origin: Secondary | ICD-10-CM | POA: Diagnosis not present

## 2017-12-03 DIAGNOSIS — D509 Iron deficiency anemia, unspecified: Secondary | ICD-10-CM | POA: Diagnosis not present

## 2017-12-03 DIAGNOSIS — Z992 Dependence on renal dialysis: Secondary | ICD-10-CM | POA: Diagnosis not present

## 2017-12-03 DIAGNOSIS — N186 End stage renal disease: Secondary | ICD-10-CM | POA: Diagnosis not present

## 2017-12-03 DIAGNOSIS — D631 Anemia in chronic kidney disease: Secondary | ICD-10-CM | POA: Diagnosis not present

## 2017-12-04 DIAGNOSIS — E11621 Type 2 diabetes mellitus with foot ulcer: Secondary | ICD-10-CM | POA: Diagnosis not present

## 2017-12-04 DIAGNOSIS — L97522 Non-pressure chronic ulcer of other part of left foot with fat layer exposed: Secondary | ICD-10-CM | POA: Diagnosis not present

## 2017-12-04 DIAGNOSIS — L97528 Non-pressure chronic ulcer of other part of left foot with other specified severity: Secondary | ICD-10-CM | POA: Diagnosis not present

## 2017-12-04 DIAGNOSIS — L97521 Non-pressure chronic ulcer of other part of left foot limited to breakdown of skin: Secondary | ICD-10-CM | POA: Diagnosis not present

## 2017-12-04 DIAGNOSIS — S90422D Blister (nonthermal), left great toe, subsequent encounter: Secondary | ICD-10-CM | POA: Diagnosis not present

## 2017-12-04 DIAGNOSIS — E1122 Type 2 diabetes mellitus with diabetic chronic kidney disease: Secondary | ICD-10-CM | POA: Diagnosis not present

## 2017-12-05 DIAGNOSIS — N186 End stage renal disease: Secondary | ICD-10-CM | POA: Diagnosis not present

## 2017-12-05 DIAGNOSIS — Z992 Dependence on renal dialysis: Secondary | ICD-10-CM | POA: Diagnosis not present

## 2017-12-05 DIAGNOSIS — N2581 Secondary hyperparathyroidism of renal origin: Secondary | ICD-10-CM | POA: Diagnosis not present

## 2017-12-05 DIAGNOSIS — D509 Iron deficiency anemia, unspecified: Secondary | ICD-10-CM | POA: Diagnosis not present

## 2017-12-05 DIAGNOSIS — D631 Anemia in chronic kidney disease: Secondary | ICD-10-CM | POA: Diagnosis not present

## 2017-12-07 DIAGNOSIS — Z992 Dependence on renal dialysis: Secondary | ICD-10-CM | POA: Diagnosis not present

## 2017-12-07 DIAGNOSIS — N2581 Secondary hyperparathyroidism of renal origin: Secondary | ICD-10-CM | POA: Diagnosis not present

## 2017-12-07 DIAGNOSIS — D631 Anemia in chronic kidney disease: Secondary | ICD-10-CM | POA: Diagnosis not present

## 2017-12-07 DIAGNOSIS — D509 Iron deficiency anemia, unspecified: Secondary | ICD-10-CM | POA: Diagnosis not present

## 2017-12-07 DIAGNOSIS — N186 End stage renal disease: Secondary | ICD-10-CM | POA: Diagnosis not present

## 2017-12-10 DIAGNOSIS — N186 End stage renal disease: Secondary | ICD-10-CM | POA: Diagnosis not present

## 2017-12-10 DIAGNOSIS — D509 Iron deficiency anemia, unspecified: Secondary | ICD-10-CM | POA: Diagnosis not present

## 2017-12-10 DIAGNOSIS — D631 Anemia in chronic kidney disease: Secondary | ICD-10-CM | POA: Diagnosis not present

## 2017-12-10 DIAGNOSIS — Z992 Dependence on renal dialysis: Secondary | ICD-10-CM | POA: Diagnosis not present

## 2017-12-10 DIAGNOSIS — N2581 Secondary hyperparathyroidism of renal origin: Secondary | ICD-10-CM | POA: Diagnosis not present

## 2017-12-12 DIAGNOSIS — D509 Iron deficiency anemia, unspecified: Secondary | ICD-10-CM | POA: Diagnosis not present

## 2017-12-12 DIAGNOSIS — N2581 Secondary hyperparathyroidism of renal origin: Secondary | ICD-10-CM | POA: Diagnosis not present

## 2017-12-12 DIAGNOSIS — D631 Anemia in chronic kidney disease: Secondary | ICD-10-CM | POA: Diagnosis not present

## 2017-12-12 DIAGNOSIS — Z992 Dependence on renal dialysis: Secondary | ICD-10-CM | POA: Diagnosis not present

## 2017-12-12 DIAGNOSIS — N186 End stage renal disease: Secondary | ICD-10-CM | POA: Diagnosis not present

## 2017-12-13 DIAGNOSIS — R52 Pain, unspecified: Secondary | ICD-10-CM | POA: Diagnosis not present

## 2017-12-13 DIAGNOSIS — E11621 Type 2 diabetes mellitus with foot ulcer: Secondary | ICD-10-CM | POA: Diagnosis not present

## 2017-12-13 DIAGNOSIS — E440 Moderate protein-calorie malnutrition: Secondary | ICD-10-CM | POA: Diagnosis present

## 2017-12-13 DIAGNOSIS — Z955 Presence of coronary angioplasty implant and graft: Secondary | ICD-10-CM | POA: Diagnosis not present

## 2017-12-13 DIAGNOSIS — L97522 Non-pressure chronic ulcer of other part of left foot with fat layer exposed: Secondary | ICD-10-CM | POA: Diagnosis not present

## 2017-12-13 DIAGNOSIS — Z794 Long term (current) use of insulin: Secondary | ICD-10-CM | POA: Diagnosis not present

## 2017-12-13 DIAGNOSIS — I132 Hypertensive heart and chronic kidney disease with heart failure and with stage 5 chronic kidney disease, or end stage renal disease: Secondary | ICD-10-CM | POA: Diagnosis not present

## 2017-12-13 DIAGNOSIS — S90422D Blister (nonthermal), left great toe, subsequent encounter: Secondary | ICD-10-CM | POA: Diagnosis not present

## 2017-12-13 DIAGNOSIS — R627 Adult failure to thrive: Secondary | ICD-10-CM | POA: Diagnosis present

## 2017-12-13 DIAGNOSIS — Z881 Allergy status to other antibiotic agents status: Secondary | ICD-10-CM | POA: Diagnosis not present

## 2017-12-13 DIAGNOSIS — L03319 Cellulitis of trunk, unspecified: Secondary | ICD-10-CM | POA: Diagnosis present

## 2017-12-13 DIAGNOSIS — R197 Diarrhea, unspecified: Secondary | ICD-10-CM | POA: Diagnosis not present

## 2017-12-13 DIAGNOSIS — Z885 Allergy status to narcotic agent status: Secondary | ICD-10-CM | POA: Diagnosis not present

## 2017-12-13 DIAGNOSIS — L03114 Cellulitis of left upper limb: Secondary | ICD-10-CM | POA: Diagnosis present

## 2017-12-13 DIAGNOSIS — E1122 Type 2 diabetes mellitus with diabetic chronic kidney disease: Secondary | ICD-10-CM | POA: Diagnosis present

## 2017-12-13 DIAGNOSIS — E1129 Type 2 diabetes mellitus with other diabetic kidney complication: Secondary | ICD-10-CM | POA: Diagnosis not present

## 2017-12-13 DIAGNOSIS — I2510 Atherosclerotic heart disease of native coronary artery without angina pectoris: Secondary | ICD-10-CM | POA: Diagnosis present

## 2017-12-13 DIAGNOSIS — M47896 Other spondylosis, lumbar region: Secondary | ICD-10-CM | POA: Diagnosis present

## 2017-12-13 DIAGNOSIS — E1151 Type 2 diabetes mellitus with diabetic peripheral angiopathy without gangrene: Secondary | ICD-10-CM | POA: Diagnosis present

## 2017-12-13 DIAGNOSIS — L97521 Non-pressure chronic ulcer of other part of left foot limited to breakdown of skin: Secondary | ICD-10-CM | POA: Diagnosis not present

## 2017-12-13 DIAGNOSIS — I482 Chronic atrial fibrillation, unspecified: Secondary | ICD-10-CM | POA: Diagnosis present

## 2017-12-13 DIAGNOSIS — Z992 Dependence on renal dialysis: Secondary | ICD-10-CM | POA: Diagnosis not present

## 2017-12-13 DIAGNOSIS — I5032 Chronic diastolic (congestive) heart failure: Secondary | ICD-10-CM | POA: Diagnosis present

## 2017-12-13 DIAGNOSIS — L03113 Cellulitis of right upper limb: Secondary | ICD-10-CM | POA: Diagnosis present

## 2017-12-13 DIAGNOSIS — Z681 Body mass index (BMI) 19 or less, adult: Secondary | ICD-10-CM | POA: Diagnosis not present

## 2017-12-13 DIAGNOSIS — L97528 Non-pressure chronic ulcer of other part of left foot with other specified severity: Secondary | ICD-10-CM | POA: Diagnosis not present

## 2017-12-13 DIAGNOSIS — F0390 Unspecified dementia without behavioral disturbance: Secondary | ICD-10-CM | POA: Diagnosis present

## 2017-12-13 DIAGNOSIS — N186 End stage renal disease: Secondary | ICD-10-CM | POA: Diagnosis not present

## 2017-12-13 DIAGNOSIS — Z8673 Personal history of transient ischemic attack (TIA), and cerebral infarction without residual deficits: Secondary | ICD-10-CM | POA: Diagnosis not present

## 2017-12-13 DIAGNOSIS — L989 Disorder of the skin and subcutaneous tissue, unspecified: Secondary | ICD-10-CM | POA: Diagnosis not present

## 2017-12-13 DIAGNOSIS — L309 Dermatitis, unspecified: Secondary | ICD-10-CM | POA: Diagnosis not present

## 2017-12-13 DIAGNOSIS — T8612 Kidney transplant failure: Secondary | ICD-10-CM | POA: Diagnosis present

## 2017-12-13 DIAGNOSIS — Z880 Allergy status to penicillin: Secondary | ICD-10-CM | POA: Diagnosis not present

## 2017-12-13 DIAGNOSIS — I10 Essential (primary) hypertension: Secondary | ICD-10-CM | POA: Diagnosis not present

## 2017-12-13 DIAGNOSIS — Z79899 Other long term (current) drug therapy: Secondary | ICD-10-CM | POA: Diagnosis not present

## 2017-12-18 DIAGNOSIS — L97528 Non-pressure chronic ulcer of other part of left foot with other specified severity: Secondary | ICD-10-CM | POA: Diagnosis not present

## 2017-12-18 DIAGNOSIS — L97522 Non-pressure chronic ulcer of other part of left foot with fat layer exposed: Secondary | ICD-10-CM | POA: Diagnosis not present

## 2017-12-18 DIAGNOSIS — E1122 Type 2 diabetes mellitus with diabetic chronic kidney disease: Secondary | ICD-10-CM | POA: Diagnosis not present

## 2017-12-18 DIAGNOSIS — S90422D Blister (nonthermal), left great toe, subsequent encounter: Secondary | ICD-10-CM | POA: Diagnosis not present

## 2017-12-18 DIAGNOSIS — L97521 Non-pressure chronic ulcer of other part of left foot limited to breakdown of skin: Secondary | ICD-10-CM | POA: Diagnosis not present

## 2017-12-18 DIAGNOSIS — E11621 Type 2 diabetes mellitus with foot ulcer: Secondary | ICD-10-CM | POA: Diagnosis not present

## 2017-12-19 DIAGNOSIS — N186 End stage renal disease: Secondary | ICD-10-CM | POA: Diagnosis not present

## 2017-12-19 DIAGNOSIS — D509 Iron deficiency anemia, unspecified: Secondary | ICD-10-CM | POA: Diagnosis not present

## 2017-12-19 DIAGNOSIS — Z992 Dependence on renal dialysis: Secondary | ICD-10-CM | POA: Diagnosis not present

## 2017-12-19 DIAGNOSIS — D631 Anemia in chronic kidney disease: Secondary | ICD-10-CM | POA: Diagnosis not present

## 2017-12-19 DIAGNOSIS — N2581 Secondary hyperparathyroidism of renal origin: Secondary | ICD-10-CM | POA: Diagnosis not present

## 2017-12-21 DIAGNOSIS — L97528 Non-pressure chronic ulcer of other part of left foot with other specified severity: Secondary | ICD-10-CM | POA: Diagnosis not present

## 2017-12-21 DIAGNOSIS — L03113 Cellulitis of right upper limb: Secondary | ICD-10-CM | POA: Diagnosis not present

## 2017-12-21 DIAGNOSIS — Z8673 Personal history of transient ischemic attack (TIA), and cerebral infarction without residual deficits: Secondary | ICD-10-CM | POA: Diagnosis not present

## 2017-12-21 DIAGNOSIS — Z992 Dependence on renal dialysis: Secondary | ICD-10-CM | POA: Diagnosis not present

## 2017-12-21 DIAGNOSIS — L97522 Non-pressure chronic ulcer of other part of left foot with fat layer exposed: Secondary | ICD-10-CM | POA: Diagnosis not present

## 2017-12-21 DIAGNOSIS — E1122 Type 2 diabetes mellitus with diabetic chronic kidney disease: Secondary | ICD-10-CM | POA: Diagnosis not present

## 2017-12-21 DIAGNOSIS — I482 Chronic atrial fibrillation, unspecified: Secondary | ICD-10-CM | POA: Diagnosis not present

## 2017-12-21 DIAGNOSIS — S90422D Blister (nonthermal), left great toe, subsequent encounter: Secondary | ICD-10-CM | POA: Diagnosis not present

## 2017-12-21 DIAGNOSIS — E1151 Type 2 diabetes mellitus with diabetic peripheral angiopathy without gangrene: Secondary | ICD-10-CM | POA: Diagnosis not present

## 2017-12-21 DIAGNOSIS — I5020 Unspecified systolic (congestive) heart failure: Secondary | ICD-10-CM | POA: Diagnosis not present

## 2017-12-21 DIAGNOSIS — N186 End stage renal disease: Secondary | ICD-10-CM | POA: Diagnosis not present

## 2017-12-21 DIAGNOSIS — D649 Anemia, unspecified: Secondary | ICD-10-CM | POA: Diagnosis not present

## 2017-12-21 DIAGNOSIS — I132 Hypertensive heart and chronic kidney disease with heart failure and with stage 5 chronic kidney disease, or end stage renal disease: Secondary | ICD-10-CM | POA: Diagnosis not present

## 2017-12-21 DIAGNOSIS — D631 Anemia in chronic kidney disease: Secondary | ICD-10-CM | POA: Diagnosis not present

## 2017-12-21 DIAGNOSIS — L97521 Non-pressure chronic ulcer of other part of left foot limited to breakdown of skin: Secondary | ICD-10-CM | POA: Diagnosis not present

## 2017-12-21 DIAGNOSIS — Z1159 Encounter for screening for other viral diseases: Secondary | ICD-10-CM | POA: Diagnosis not present

## 2017-12-21 DIAGNOSIS — Z4800 Encounter for change or removal of nonsurgical wound dressing: Secondary | ICD-10-CM | POA: Diagnosis not present

## 2017-12-21 DIAGNOSIS — I2510 Atherosclerotic heart disease of native coronary artery without angina pectoris: Secondary | ICD-10-CM | POA: Diagnosis not present

## 2017-12-21 DIAGNOSIS — Z79899 Other long term (current) drug therapy: Secondary | ICD-10-CM | POA: Diagnosis not present

## 2017-12-21 DIAGNOSIS — D509 Iron deficiency anemia, unspecified: Secondary | ICD-10-CM | POA: Diagnosis not present

## 2017-12-21 DIAGNOSIS — N2581 Secondary hyperparathyroidism of renal origin: Secondary | ICD-10-CM | POA: Diagnosis not present

## 2017-12-21 DIAGNOSIS — E11621 Type 2 diabetes mellitus with foot ulcer: Secondary | ICD-10-CM | POA: Diagnosis not present

## 2017-12-22 DIAGNOSIS — S90422D Blister (nonthermal), left great toe, subsequent encounter: Secondary | ICD-10-CM | POA: Diagnosis not present

## 2017-12-22 DIAGNOSIS — E11621 Type 2 diabetes mellitus with foot ulcer: Secondary | ICD-10-CM | POA: Diagnosis not present

## 2017-12-22 DIAGNOSIS — L03113 Cellulitis of right upper limb: Secondary | ICD-10-CM | POA: Diagnosis not present

## 2017-12-22 DIAGNOSIS — N186 End stage renal disease: Secondary | ICD-10-CM | POA: Diagnosis not present

## 2017-12-22 DIAGNOSIS — L97521 Non-pressure chronic ulcer of other part of left foot limited to breakdown of skin: Secondary | ICD-10-CM | POA: Diagnosis not present

## 2017-12-22 DIAGNOSIS — L97522 Non-pressure chronic ulcer of other part of left foot with fat layer exposed: Secondary | ICD-10-CM | POA: Diagnosis not present

## 2017-12-22 DIAGNOSIS — L97528 Non-pressure chronic ulcer of other part of left foot with other specified severity: Secondary | ICD-10-CM | POA: Diagnosis not present

## 2017-12-22 DIAGNOSIS — Z992 Dependence on renal dialysis: Secondary | ICD-10-CM | POA: Diagnosis not present

## 2017-12-24 DIAGNOSIS — L97521 Non-pressure chronic ulcer of other part of left foot limited to breakdown of skin: Secondary | ICD-10-CM | POA: Diagnosis not present

## 2017-12-24 DIAGNOSIS — S90422D Blister (nonthermal), left great toe, subsequent encounter: Secondary | ICD-10-CM | POA: Diagnosis not present

## 2017-12-24 DIAGNOSIS — E11621 Type 2 diabetes mellitus with foot ulcer: Secondary | ICD-10-CM | POA: Diagnosis not present

## 2017-12-24 DIAGNOSIS — L03113 Cellulitis of right upper limb: Secondary | ICD-10-CM | POA: Diagnosis not present

## 2017-12-24 DIAGNOSIS — J40 Bronchitis, not specified as acute or chronic: Secondary | ICD-10-CM | POA: Diagnosis not present

## 2017-12-24 DIAGNOSIS — L97528 Non-pressure chronic ulcer of other part of left foot with other specified severity: Secondary | ICD-10-CM | POA: Diagnosis not present

## 2017-12-24 DIAGNOSIS — L97522 Non-pressure chronic ulcer of other part of left foot with fat layer exposed: Secondary | ICD-10-CM | POA: Diagnosis not present

## 2017-12-26 DIAGNOSIS — D509 Iron deficiency anemia, unspecified: Secondary | ICD-10-CM | POA: Diagnosis not present

## 2017-12-26 DIAGNOSIS — N2581 Secondary hyperparathyroidism of renal origin: Secondary | ICD-10-CM | POA: Diagnosis not present

## 2017-12-26 DIAGNOSIS — Z992 Dependence on renal dialysis: Secondary | ICD-10-CM | POA: Diagnosis not present

## 2017-12-26 DIAGNOSIS — N186 End stage renal disease: Secondary | ICD-10-CM | POA: Diagnosis not present

## 2017-12-26 DIAGNOSIS — D631 Anemia in chronic kidney disease: Secondary | ICD-10-CM | POA: Diagnosis not present

## 2017-12-27 DIAGNOSIS — L97528 Non-pressure chronic ulcer of other part of left foot with other specified severity: Secondary | ICD-10-CM | POA: Diagnosis not present

## 2017-12-27 DIAGNOSIS — L03113 Cellulitis of right upper limb: Secondary | ICD-10-CM | POA: Diagnosis not present

## 2017-12-27 DIAGNOSIS — L97521 Non-pressure chronic ulcer of other part of left foot limited to breakdown of skin: Secondary | ICD-10-CM | POA: Diagnosis not present

## 2017-12-27 DIAGNOSIS — S90422D Blister (nonthermal), left great toe, subsequent encounter: Secondary | ICD-10-CM | POA: Diagnosis not present

## 2017-12-27 DIAGNOSIS — E11621 Type 2 diabetes mellitus with foot ulcer: Secondary | ICD-10-CM | POA: Diagnosis not present

## 2017-12-27 DIAGNOSIS — L97522 Non-pressure chronic ulcer of other part of left foot with fat layer exposed: Secondary | ICD-10-CM | POA: Diagnosis not present

## 2017-12-28 DIAGNOSIS — D509 Iron deficiency anemia, unspecified: Secondary | ICD-10-CM | POA: Diagnosis not present

## 2017-12-28 DIAGNOSIS — Z992 Dependence on renal dialysis: Secondary | ICD-10-CM | POA: Diagnosis not present

## 2017-12-28 DIAGNOSIS — D631 Anemia in chronic kidney disease: Secondary | ICD-10-CM | POA: Diagnosis not present

## 2017-12-28 DIAGNOSIS — N2581 Secondary hyperparathyroidism of renal origin: Secondary | ICD-10-CM | POA: Diagnosis not present

## 2017-12-28 DIAGNOSIS — N186 End stage renal disease: Secondary | ICD-10-CM | POA: Diagnosis not present

## 2017-12-30 DIAGNOSIS — N186 End stage renal disease: Secondary | ICD-10-CM | POA: Diagnosis not present

## 2017-12-30 DIAGNOSIS — I5032 Chronic diastolic (congestive) heart failure: Secondary | ICD-10-CM | POA: Diagnosis not present

## 2017-12-30 DIAGNOSIS — R531 Weakness: Secondary | ICD-10-CM | POA: Diagnosis not present

## 2017-12-30 DIAGNOSIS — I214 Non-ST elevation (NSTEMI) myocardial infarction: Secondary | ICD-10-CM | POA: Diagnosis not present

## 2017-12-30 DIAGNOSIS — Z681 Body mass index (BMI) 19 or less, adult: Secondary | ICD-10-CM | POA: Diagnosis not present

## 2017-12-30 DIAGNOSIS — S0191XA Laceration without foreign body of unspecified part of head, initial encounter: Secondary | ICD-10-CM | POA: Diagnosis not present

## 2017-12-30 DIAGNOSIS — I132 Hypertensive heart and chronic kidney disease with heart failure and with stage 5 chronic kidney disease, or end stage renal disease: Secondary | ICD-10-CM | POA: Diagnosis not present

## 2017-12-30 DIAGNOSIS — E440 Moderate protein-calorie malnutrition: Secondary | ICD-10-CM | POA: Diagnosis not present

## 2017-12-31 DIAGNOSIS — I2510 Atherosclerotic heart disease of native coronary artery without angina pectoris: Secondary | ICD-10-CM | POA: Diagnosis present

## 2017-12-31 DIAGNOSIS — Z794 Long term (current) use of insulin: Secondary | ICD-10-CM | POA: Diagnosis not present

## 2017-12-31 DIAGNOSIS — I214 Non-ST elevation (NSTEMI) myocardial infarction: Secondary | ICD-10-CM | POA: Diagnosis present

## 2017-12-31 DIAGNOSIS — K861 Other chronic pancreatitis: Secondary | ICD-10-CM | POA: Diagnosis present

## 2017-12-31 DIAGNOSIS — D631 Anemia in chronic kidney disease: Secondary | ICD-10-CM | POA: Diagnosis not present

## 2017-12-31 DIAGNOSIS — Z87891 Personal history of nicotine dependence: Secondary | ICD-10-CM | POA: Diagnosis not present

## 2017-12-31 DIAGNOSIS — N186 End stage renal disease: Secondary | ICD-10-CM | POA: Diagnosis present

## 2017-12-31 DIAGNOSIS — R531 Weakness: Secondary | ICD-10-CM | POA: Diagnosis present

## 2017-12-31 DIAGNOSIS — E1122 Type 2 diabetes mellitus with diabetic chronic kidney disease: Secondary | ICD-10-CM | POA: Diagnosis present

## 2017-12-31 DIAGNOSIS — Z681 Body mass index (BMI) 19 or less, adult: Secondary | ICD-10-CM | POA: Diagnosis not present

## 2017-12-31 DIAGNOSIS — I5032 Chronic diastolic (congestive) heart failure: Secondary | ICD-10-CM | POA: Diagnosis present

## 2017-12-31 DIAGNOSIS — N390 Urinary tract infection, site not specified: Secondary | ICD-10-CM | POA: Diagnosis present

## 2017-12-31 DIAGNOSIS — E1165 Type 2 diabetes mellitus with hyperglycemia: Secondary | ICD-10-CM | POA: Diagnosis present

## 2017-12-31 DIAGNOSIS — I132 Hypertensive heart and chronic kidney disease with heart failure and with stage 5 chronic kidney disease, or end stage renal disease: Secondary | ICD-10-CM | POA: Diagnosis present

## 2017-12-31 DIAGNOSIS — Z8673 Personal history of transient ischemic attack (TIA), and cerebral infarction without residual deficits: Secondary | ICD-10-CM | POA: Diagnosis not present

## 2017-12-31 DIAGNOSIS — R627 Adult failure to thrive: Secondary | ICD-10-CM | POA: Diagnosis present

## 2017-12-31 DIAGNOSIS — Z955 Presence of coronary angioplasty implant and graft: Secondary | ICD-10-CM | POA: Diagnosis not present

## 2017-12-31 DIAGNOSIS — I482 Chronic atrial fibrillation, unspecified: Secondary | ICD-10-CM | POA: Diagnosis present

## 2017-12-31 DIAGNOSIS — E1129 Type 2 diabetes mellitus with other diabetic kidney complication: Secondary | ICD-10-CM | POA: Diagnosis not present

## 2017-12-31 DIAGNOSIS — E440 Moderate protein-calorie malnutrition: Secondary | ICD-10-CM | POA: Diagnosis present

## 2017-12-31 DIAGNOSIS — Z992 Dependence on renal dialysis: Secondary | ICD-10-CM | POA: Diagnosis not present

## 2017-12-31 DIAGNOSIS — I10 Essential (primary) hypertension: Secondary | ICD-10-CM | POA: Diagnosis not present

## 2017-12-31 DIAGNOSIS — F0150 Vascular dementia without behavioral disturbance: Secondary | ICD-10-CM | POA: Diagnosis present

## 2018-01-03 DIAGNOSIS — S90422D Blister (nonthermal), left great toe, subsequent encounter: Secondary | ICD-10-CM | POA: Diagnosis not present

## 2018-01-03 DIAGNOSIS — L97522 Non-pressure chronic ulcer of other part of left foot with fat layer exposed: Secondary | ICD-10-CM | POA: Diagnosis not present

## 2018-01-03 DIAGNOSIS — L03113 Cellulitis of right upper limb: Secondary | ICD-10-CM | POA: Diagnosis not present

## 2018-01-03 DIAGNOSIS — L97521 Non-pressure chronic ulcer of other part of left foot limited to breakdown of skin: Secondary | ICD-10-CM | POA: Diagnosis not present

## 2018-01-03 DIAGNOSIS — E11621 Type 2 diabetes mellitus with foot ulcer: Secondary | ICD-10-CM | POA: Diagnosis not present

## 2018-01-03 DIAGNOSIS — L97528 Non-pressure chronic ulcer of other part of left foot with other specified severity: Secondary | ICD-10-CM | POA: Diagnosis not present

## 2018-01-04 DIAGNOSIS — N186 End stage renal disease: Secondary | ICD-10-CM | POA: Diagnosis not present

## 2018-01-04 DIAGNOSIS — D631 Anemia in chronic kidney disease: Secondary | ICD-10-CM | POA: Diagnosis not present

## 2018-01-04 DIAGNOSIS — Z992 Dependence on renal dialysis: Secondary | ICD-10-CM | POA: Diagnosis not present

## 2018-01-04 DIAGNOSIS — D509 Iron deficiency anemia, unspecified: Secondary | ICD-10-CM | POA: Diagnosis not present

## 2018-01-04 DIAGNOSIS — N2581 Secondary hyperparathyroidism of renal origin: Secondary | ICD-10-CM | POA: Diagnosis not present

## 2018-01-07 DIAGNOSIS — N186 End stage renal disease: Secondary | ICD-10-CM | POA: Diagnosis not present

## 2018-01-07 DIAGNOSIS — D631 Anemia in chronic kidney disease: Secondary | ICD-10-CM | POA: Diagnosis not present

## 2018-01-07 DIAGNOSIS — N2581 Secondary hyperparathyroidism of renal origin: Secondary | ICD-10-CM | POA: Diagnosis not present

## 2018-01-07 DIAGNOSIS — D509 Iron deficiency anemia, unspecified: Secondary | ICD-10-CM | POA: Diagnosis not present

## 2018-01-07 DIAGNOSIS — Z992 Dependence on renal dialysis: Secondary | ICD-10-CM | POA: Diagnosis not present

## 2018-01-08 DIAGNOSIS — S90422D Blister (nonthermal), left great toe, subsequent encounter: Secondary | ICD-10-CM | POA: Diagnosis not present

## 2018-01-08 DIAGNOSIS — L97521 Non-pressure chronic ulcer of other part of left foot limited to breakdown of skin: Secondary | ICD-10-CM | POA: Diagnosis not present

## 2018-01-08 DIAGNOSIS — L97528 Non-pressure chronic ulcer of other part of left foot with other specified severity: Secondary | ICD-10-CM | POA: Diagnosis not present

## 2018-01-08 DIAGNOSIS — E11621 Type 2 diabetes mellitus with foot ulcer: Secondary | ICD-10-CM | POA: Diagnosis not present

## 2018-01-08 DIAGNOSIS — L97522 Non-pressure chronic ulcer of other part of left foot with fat layer exposed: Secondary | ICD-10-CM | POA: Diagnosis not present

## 2018-01-08 DIAGNOSIS — L03113 Cellulitis of right upper limb: Secondary | ICD-10-CM | POA: Diagnosis not present

## 2018-01-09 DIAGNOSIS — Z992 Dependence on renal dialysis: Secondary | ICD-10-CM | POA: Diagnosis not present

## 2018-01-09 DIAGNOSIS — N2581 Secondary hyperparathyroidism of renal origin: Secondary | ICD-10-CM | POA: Diagnosis not present

## 2018-01-09 DIAGNOSIS — N186 End stage renal disease: Secondary | ICD-10-CM | POA: Diagnosis not present

## 2018-01-09 DIAGNOSIS — D509 Iron deficiency anemia, unspecified: Secondary | ICD-10-CM | POA: Diagnosis not present

## 2018-01-09 DIAGNOSIS — D631 Anemia in chronic kidney disease: Secondary | ICD-10-CM | POA: Diagnosis not present

## 2018-01-10 DIAGNOSIS — L97521 Non-pressure chronic ulcer of other part of left foot limited to breakdown of skin: Secondary | ICD-10-CM | POA: Diagnosis not present

## 2018-01-10 DIAGNOSIS — L03113 Cellulitis of right upper limb: Secondary | ICD-10-CM | POA: Diagnosis not present

## 2018-01-10 DIAGNOSIS — L97528 Non-pressure chronic ulcer of other part of left foot with other specified severity: Secondary | ICD-10-CM | POA: Diagnosis not present

## 2018-01-10 DIAGNOSIS — L97522 Non-pressure chronic ulcer of other part of left foot with fat layer exposed: Secondary | ICD-10-CM | POA: Diagnosis not present

## 2018-01-10 DIAGNOSIS — E11621 Type 2 diabetes mellitus with foot ulcer: Secondary | ICD-10-CM | POA: Diagnosis not present

## 2018-01-10 DIAGNOSIS — S90422D Blister (nonthermal), left great toe, subsequent encounter: Secondary | ICD-10-CM | POA: Diagnosis not present

## 2018-01-11 DIAGNOSIS — D631 Anemia in chronic kidney disease: Secondary | ICD-10-CM | POA: Diagnosis not present

## 2018-01-11 DIAGNOSIS — D509 Iron deficiency anemia, unspecified: Secondary | ICD-10-CM | POA: Diagnosis not present

## 2018-01-11 DIAGNOSIS — N2581 Secondary hyperparathyroidism of renal origin: Secondary | ICD-10-CM | POA: Diagnosis not present

## 2018-01-11 DIAGNOSIS — N186 End stage renal disease: Secondary | ICD-10-CM | POA: Diagnosis not present

## 2018-01-11 DIAGNOSIS — Z992 Dependence on renal dialysis: Secondary | ICD-10-CM | POA: Diagnosis not present

## 2018-01-13 DIAGNOSIS — N186 End stage renal disease: Secondary | ICD-10-CM | POA: Diagnosis not present

## 2018-01-13 DIAGNOSIS — R531 Weakness: Secondary | ICD-10-CM | POA: Diagnosis not present

## 2018-01-13 DIAGNOSIS — E43 Unspecified severe protein-calorie malnutrition: Secondary | ICD-10-CM | POA: Diagnosis not present

## 2018-01-13 DIAGNOSIS — F0150 Vascular dementia without behavioral disturbance: Secondary | ICD-10-CM | POA: Diagnosis not present

## 2018-01-13 DIAGNOSIS — I739 Peripheral vascular disease, unspecified: Secondary | ICD-10-CM | POA: Diagnosis not present

## 2018-01-13 DIAGNOSIS — I2510 Atherosclerotic heart disease of native coronary artery without angina pectoris: Secondary | ICD-10-CM | POA: Diagnosis not present

## 2018-01-13 DIAGNOSIS — Z992 Dependence on renal dialysis: Secondary | ICD-10-CM | POA: Diagnosis not present

## 2018-01-13 DIAGNOSIS — R001 Bradycardia, unspecified: Secondary | ICD-10-CM | POA: Diagnosis not present

## 2018-01-13 DIAGNOSIS — R319 Hematuria, unspecified: Secondary | ICD-10-CM | POA: Diagnosis not present

## 2018-01-13 DIAGNOSIS — D631 Anemia in chronic kidney disease: Secondary | ICD-10-CM | POA: Diagnosis not present

## 2018-01-13 DIAGNOSIS — E860 Dehydration: Secondary | ICD-10-CM | POA: Diagnosis not present

## 2018-01-13 DIAGNOSIS — B952 Enterococcus as the cause of diseases classified elsewhere: Secondary | ICD-10-CM | POA: Diagnosis not present

## 2018-01-13 DIAGNOSIS — Z794 Long term (current) use of insulin: Secondary | ICD-10-CM | POA: Diagnosis not present

## 2018-01-13 DIAGNOSIS — E1122 Type 2 diabetes mellitus with diabetic chronic kidney disease: Secondary | ICD-10-CM | POA: Diagnosis not present

## 2018-01-13 DIAGNOSIS — N390 Urinary tract infection, site not specified: Secondary | ICD-10-CM | POA: Diagnosis not present

## 2018-01-13 DIAGNOSIS — I132 Hypertensive heart and chronic kidney disease with heart failure and with stage 5 chronic kidney disease, or end stage renal disease: Secondary | ICD-10-CM | POA: Diagnosis not present

## 2018-01-13 DIAGNOSIS — Z955 Presence of coronary angioplasty implant and graft: Secondary | ICD-10-CM | POA: Diagnosis not present

## 2018-01-13 DIAGNOSIS — R1030 Lower abdominal pain, unspecified: Secondary | ICD-10-CM | POA: Diagnosis not present

## 2018-01-13 DIAGNOSIS — R079 Chest pain, unspecified: Secondary | ICD-10-CM | POA: Diagnosis not present

## 2018-01-13 DIAGNOSIS — R0789 Other chest pain: Secondary | ICD-10-CM | POA: Diagnosis not present

## 2018-01-13 DIAGNOSIS — Z87891 Personal history of nicotine dependence: Secondary | ICD-10-CM | POA: Diagnosis not present

## 2018-01-13 DIAGNOSIS — Z940 Kidney transplant status: Secondary | ICD-10-CM | POA: Diagnosis not present

## 2018-01-13 DIAGNOSIS — R4182 Altered mental status, unspecified: Secondary | ICD-10-CM | POA: Diagnosis not present

## 2018-01-13 DIAGNOSIS — Z79899 Other long term (current) drug therapy: Secondary | ICD-10-CM | POA: Diagnosis not present

## 2018-01-13 DIAGNOSIS — I5032 Chronic diastolic (congestive) heart failure: Secondary | ICD-10-CM | POA: Diagnosis not present

## 2018-01-13 DIAGNOSIS — Z681 Body mass index (BMI) 19 or less, adult: Secondary | ICD-10-CM | POA: Diagnosis not present

## 2018-01-13 DIAGNOSIS — M47816 Spondylosis without myelopathy or radiculopathy, lumbar region: Secondary | ICD-10-CM | POA: Diagnosis not present

## 2018-01-13 DIAGNOSIS — Z7982 Long term (current) use of aspirin: Secondary | ICD-10-CM | POA: Diagnosis not present

## 2018-01-13 DIAGNOSIS — I482 Chronic atrial fibrillation, unspecified: Secondary | ICD-10-CM | POA: Diagnosis not present

## 2018-01-14 DIAGNOSIS — R4182 Altered mental status, unspecified: Secondary | ICD-10-CM | POA: Diagnosis not present

## 2018-01-14 DIAGNOSIS — N390 Urinary tract infection, site not specified: Secondary | ICD-10-CM | POA: Diagnosis not present

## 2018-01-14 DIAGNOSIS — E43 Unspecified severe protein-calorie malnutrition: Secondary | ICD-10-CM | POA: Diagnosis not present

## 2018-01-14 DIAGNOSIS — B952 Enterococcus as the cause of diseases classified elsewhere: Secondary | ICD-10-CM | POA: Diagnosis not present

## 2018-01-14 DIAGNOSIS — E860 Dehydration: Secondary | ICD-10-CM | POA: Diagnosis not present

## 2018-01-15 DIAGNOSIS — E860 Dehydration: Secondary | ICD-10-CM | POA: Diagnosis not present

## 2018-01-15 DIAGNOSIS — R4182 Altered mental status, unspecified: Secondary | ICD-10-CM | POA: Diagnosis not present

## 2018-01-15 DIAGNOSIS — E43 Unspecified severe protein-calorie malnutrition: Secondary | ICD-10-CM | POA: Diagnosis not present

## 2018-01-15 DIAGNOSIS — B952 Enterococcus as the cause of diseases classified elsewhere: Secondary | ICD-10-CM | POA: Diagnosis not present

## 2018-01-15 DIAGNOSIS — N390 Urinary tract infection, site not specified: Secondary | ICD-10-CM | POA: Diagnosis not present

## 2018-01-16 DIAGNOSIS — L97521 Non-pressure chronic ulcer of other part of left foot limited to breakdown of skin: Secondary | ICD-10-CM | POA: Diagnosis not present

## 2018-01-16 DIAGNOSIS — L03113 Cellulitis of right upper limb: Secondary | ICD-10-CM | POA: Diagnosis not present

## 2018-01-16 DIAGNOSIS — S90422D Blister (nonthermal), left great toe, subsequent encounter: Secondary | ICD-10-CM | POA: Diagnosis not present

## 2018-01-16 DIAGNOSIS — E11621 Type 2 diabetes mellitus with foot ulcer: Secondary | ICD-10-CM | POA: Diagnosis not present

## 2018-01-16 DIAGNOSIS — L97522 Non-pressure chronic ulcer of other part of left foot with fat layer exposed: Secondary | ICD-10-CM | POA: Diagnosis not present

## 2018-01-16 DIAGNOSIS — L97528 Non-pressure chronic ulcer of other part of left foot with other specified severity: Secondary | ICD-10-CM | POA: Diagnosis not present

## 2018-01-17 DIAGNOSIS — N2581 Secondary hyperparathyroidism of renal origin: Secondary | ICD-10-CM | POA: Diagnosis not present

## 2018-01-17 DIAGNOSIS — Z992 Dependence on renal dialysis: Secondary | ICD-10-CM | POA: Diagnosis not present

## 2018-01-17 DIAGNOSIS — D631 Anemia in chronic kidney disease: Secondary | ICD-10-CM | POA: Diagnosis not present

## 2018-01-17 DIAGNOSIS — N186 End stage renal disease: Secondary | ICD-10-CM | POA: Diagnosis not present

## 2018-01-17 DIAGNOSIS — D509 Iron deficiency anemia, unspecified: Secondary | ICD-10-CM | POA: Diagnosis not present

## 2018-01-18 DIAGNOSIS — Z794 Long term (current) use of insulin: Secondary | ICD-10-CM | POA: Diagnosis not present

## 2018-01-18 DIAGNOSIS — N2581 Secondary hyperparathyroidism of renal origin: Secondary | ICD-10-CM | POA: Diagnosis not present

## 2018-01-18 DIAGNOSIS — E119 Type 2 diabetes mellitus without complications: Secondary | ICD-10-CM | POA: Diagnosis not present

## 2018-01-18 DIAGNOSIS — Z992 Dependence on renal dialysis: Secondary | ICD-10-CM | POA: Diagnosis not present

## 2018-01-18 DIAGNOSIS — D631 Anemia in chronic kidney disease: Secondary | ICD-10-CM | POA: Diagnosis not present

## 2018-01-18 DIAGNOSIS — D509 Iron deficiency anemia, unspecified: Secondary | ICD-10-CM | POA: Diagnosis not present

## 2018-01-18 DIAGNOSIS — N186 End stage renal disease: Secondary | ICD-10-CM | POA: Diagnosis not present

## 2018-01-21 DIAGNOSIS — N2581 Secondary hyperparathyroidism of renal origin: Secondary | ICD-10-CM | POA: Diagnosis not present

## 2018-01-21 DIAGNOSIS — Z992 Dependence on renal dialysis: Secondary | ICD-10-CM | POA: Diagnosis not present

## 2018-01-21 DIAGNOSIS — D509 Iron deficiency anemia, unspecified: Secondary | ICD-10-CM | POA: Diagnosis not present

## 2018-01-21 DIAGNOSIS — N186 End stage renal disease: Secondary | ICD-10-CM | POA: Diagnosis not present

## 2018-01-21 DIAGNOSIS — D631 Anemia in chronic kidney disease: Secondary | ICD-10-CM | POA: Diagnosis not present

## 2018-01-22 DIAGNOSIS — Z992 Dependence on renal dialysis: Secondary | ICD-10-CM | POA: Diagnosis not present

## 2018-01-22 DIAGNOSIS — L97522 Non-pressure chronic ulcer of other part of left foot with fat layer exposed: Secondary | ICD-10-CM | POA: Diagnosis not present

## 2018-01-22 DIAGNOSIS — N186 End stage renal disease: Secondary | ICD-10-CM | POA: Diagnosis not present

## 2018-01-22 DIAGNOSIS — L03113 Cellulitis of right upper limb: Secondary | ICD-10-CM | POA: Diagnosis not present

## 2018-01-22 DIAGNOSIS — E11621 Type 2 diabetes mellitus with foot ulcer: Secondary | ICD-10-CM | POA: Diagnosis not present

## 2018-01-22 DIAGNOSIS — S90422D Blister (nonthermal), left great toe, subsequent encounter: Secondary | ICD-10-CM | POA: Diagnosis not present

## 2018-01-22 DIAGNOSIS — L97521 Non-pressure chronic ulcer of other part of left foot limited to breakdown of skin: Secondary | ICD-10-CM | POA: Diagnosis not present

## 2018-01-22 DIAGNOSIS — L97528 Non-pressure chronic ulcer of other part of left foot with other specified severity: Secondary | ICD-10-CM | POA: Diagnosis not present

## 2018-01-23 DIAGNOSIS — Z992 Dependence on renal dialysis: Secondary | ICD-10-CM | POA: Diagnosis not present

## 2018-01-23 DIAGNOSIS — D509 Iron deficiency anemia, unspecified: Secondary | ICD-10-CM | POA: Diagnosis not present

## 2018-01-23 DIAGNOSIS — D631 Anemia in chronic kidney disease: Secondary | ICD-10-CM | POA: Diagnosis not present

## 2018-01-23 DIAGNOSIS — N186 End stage renal disease: Secondary | ICD-10-CM | POA: Diagnosis not present

## 2018-01-23 DIAGNOSIS — N2581 Secondary hyperparathyroidism of renal origin: Secondary | ICD-10-CM | POA: Diagnosis not present

## 2018-01-25 DIAGNOSIS — D631 Anemia in chronic kidney disease: Secondary | ICD-10-CM | POA: Diagnosis not present

## 2018-01-25 DIAGNOSIS — Z992 Dependence on renal dialysis: Secondary | ICD-10-CM | POA: Diagnosis not present

## 2018-01-25 DIAGNOSIS — N186 End stage renal disease: Secondary | ICD-10-CM | POA: Diagnosis not present

## 2018-01-25 DIAGNOSIS — N2581 Secondary hyperparathyroidism of renal origin: Secondary | ICD-10-CM | POA: Diagnosis not present

## 2018-01-25 DIAGNOSIS — D509 Iron deficiency anemia, unspecified: Secondary | ICD-10-CM | POA: Diagnosis not present

## 2018-01-28 DIAGNOSIS — N2581 Secondary hyperparathyroidism of renal origin: Secondary | ICD-10-CM | POA: Diagnosis not present

## 2018-01-28 DIAGNOSIS — Z992 Dependence on renal dialysis: Secondary | ICD-10-CM | POA: Diagnosis not present

## 2018-01-28 DIAGNOSIS — N186 End stage renal disease: Secondary | ICD-10-CM | POA: Diagnosis not present

## 2018-01-28 DIAGNOSIS — D631 Anemia in chronic kidney disease: Secondary | ICD-10-CM | POA: Diagnosis not present

## 2018-01-28 DIAGNOSIS — D509 Iron deficiency anemia, unspecified: Secondary | ICD-10-CM | POA: Diagnosis not present

## 2018-01-30 DIAGNOSIS — N186 End stage renal disease: Secondary | ICD-10-CM | POA: Diagnosis not present

## 2018-01-30 DIAGNOSIS — D631 Anemia in chronic kidney disease: Secondary | ICD-10-CM | POA: Diagnosis not present

## 2018-01-30 DIAGNOSIS — N2581 Secondary hyperparathyroidism of renal origin: Secondary | ICD-10-CM | POA: Diagnosis not present

## 2018-01-30 DIAGNOSIS — Z992 Dependence on renal dialysis: Secondary | ICD-10-CM | POA: Diagnosis not present

## 2018-01-30 DIAGNOSIS — D509 Iron deficiency anemia, unspecified: Secondary | ICD-10-CM | POA: Diagnosis not present

## 2018-02-01 DIAGNOSIS — D631 Anemia in chronic kidney disease: Secondary | ICD-10-CM | POA: Diagnosis not present

## 2018-02-01 DIAGNOSIS — D509 Iron deficiency anemia, unspecified: Secondary | ICD-10-CM | POA: Diagnosis not present

## 2018-02-01 DIAGNOSIS — Z992 Dependence on renal dialysis: Secondary | ICD-10-CM | POA: Diagnosis not present

## 2018-02-01 DIAGNOSIS — N2581 Secondary hyperparathyroidism of renal origin: Secondary | ICD-10-CM | POA: Diagnosis not present

## 2018-02-01 DIAGNOSIS — N186 End stage renal disease: Secondary | ICD-10-CM | POA: Diagnosis not present

## 2018-02-04 DIAGNOSIS — D509 Iron deficiency anemia, unspecified: Secondary | ICD-10-CM | POA: Diagnosis not present

## 2018-02-04 DIAGNOSIS — D631 Anemia in chronic kidney disease: Secondary | ICD-10-CM | POA: Diagnosis not present

## 2018-02-04 DIAGNOSIS — N2581 Secondary hyperparathyroidism of renal origin: Secondary | ICD-10-CM | POA: Diagnosis not present

## 2018-02-04 DIAGNOSIS — N186 End stage renal disease: Secondary | ICD-10-CM | POA: Diagnosis not present

## 2018-02-04 DIAGNOSIS — Z992 Dependence on renal dialysis: Secondary | ICD-10-CM | POA: Diagnosis not present

## 2018-02-05 DIAGNOSIS — E11621 Type 2 diabetes mellitus with foot ulcer: Secondary | ICD-10-CM | POA: Diagnosis not present

## 2018-02-05 DIAGNOSIS — L97521 Non-pressure chronic ulcer of other part of left foot limited to breakdown of skin: Secondary | ICD-10-CM | POA: Diagnosis not present

## 2018-02-05 DIAGNOSIS — L97522 Non-pressure chronic ulcer of other part of left foot with fat layer exposed: Secondary | ICD-10-CM | POA: Diagnosis not present

## 2018-02-05 DIAGNOSIS — L97528 Non-pressure chronic ulcer of other part of left foot with other specified severity: Secondary | ICD-10-CM | POA: Diagnosis not present

## 2018-02-05 DIAGNOSIS — S90422D Blister (nonthermal), left great toe, subsequent encounter: Secondary | ICD-10-CM | POA: Diagnosis not present

## 2018-02-05 DIAGNOSIS — L03113 Cellulitis of right upper limb: Secondary | ICD-10-CM | POA: Diagnosis not present

## 2018-02-06 DIAGNOSIS — N2581 Secondary hyperparathyroidism of renal origin: Secondary | ICD-10-CM | POA: Diagnosis not present

## 2018-02-06 DIAGNOSIS — N186 End stage renal disease: Secondary | ICD-10-CM | POA: Diagnosis not present

## 2018-02-06 DIAGNOSIS — D509 Iron deficiency anemia, unspecified: Secondary | ICD-10-CM | POA: Diagnosis not present

## 2018-02-06 DIAGNOSIS — D631 Anemia in chronic kidney disease: Secondary | ICD-10-CM | POA: Diagnosis not present

## 2018-02-06 DIAGNOSIS — Z992 Dependence on renal dialysis: Secondary | ICD-10-CM | POA: Diagnosis not present

## 2018-02-08 DIAGNOSIS — Z992 Dependence on renal dialysis: Secondary | ICD-10-CM | POA: Diagnosis not present

## 2018-02-08 DIAGNOSIS — N186 End stage renal disease: Secondary | ICD-10-CM | POA: Diagnosis not present

## 2018-02-08 DIAGNOSIS — N2581 Secondary hyperparathyroidism of renal origin: Secondary | ICD-10-CM | POA: Diagnosis not present

## 2018-02-08 DIAGNOSIS — D509 Iron deficiency anemia, unspecified: Secondary | ICD-10-CM | POA: Diagnosis not present

## 2018-02-08 DIAGNOSIS — D631 Anemia in chronic kidney disease: Secondary | ICD-10-CM | POA: Diagnosis not present

## 2018-02-11 DIAGNOSIS — D631 Anemia in chronic kidney disease: Secondary | ICD-10-CM | POA: Diagnosis not present

## 2018-02-11 DIAGNOSIS — N2581 Secondary hyperparathyroidism of renal origin: Secondary | ICD-10-CM | POA: Diagnosis not present

## 2018-02-11 DIAGNOSIS — N186 End stage renal disease: Secondary | ICD-10-CM | POA: Diagnosis not present

## 2018-02-11 DIAGNOSIS — D509 Iron deficiency anemia, unspecified: Secondary | ICD-10-CM | POA: Diagnosis not present

## 2018-02-11 DIAGNOSIS — Z992 Dependence on renal dialysis: Secondary | ICD-10-CM | POA: Diagnosis not present

## 2018-02-12 DIAGNOSIS — E11621 Type 2 diabetes mellitus with foot ulcer: Secondary | ICD-10-CM | POA: Diagnosis not present

## 2018-02-12 DIAGNOSIS — L97522 Non-pressure chronic ulcer of other part of left foot with fat layer exposed: Secondary | ICD-10-CM | POA: Diagnosis not present

## 2018-02-12 DIAGNOSIS — S90422D Blister (nonthermal), left great toe, subsequent encounter: Secondary | ICD-10-CM | POA: Diagnosis not present

## 2018-02-12 DIAGNOSIS — L03113 Cellulitis of right upper limb: Secondary | ICD-10-CM | POA: Diagnosis not present

## 2018-02-12 DIAGNOSIS — L97521 Non-pressure chronic ulcer of other part of left foot limited to breakdown of skin: Secondary | ICD-10-CM | POA: Diagnosis not present

## 2018-02-12 DIAGNOSIS — L97528 Non-pressure chronic ulcer of other part of left foot with other specified severity: Secondary | ICD-10-CM | POA: Diagnosis not present

## 2018-02-13 DIAGNOSIS — Z992 Dependence on renal dialysis: Secondary | ICD-10-CM | POA: Diagnosis not present

## 2018-02-13 DIAGNOSIS — N186 End stage renal disease: Secondary | ICD-10-CM | POA: Diagnosis not present

## 2018-02-13 DIAGNOSIS — N2581 Secondary hyperparathyroidism of renal origin: Secondary | ICD-10-CM | POA: Diagnosis not present

## 2018-02-13 DIAGNOSIS — D631 Anemia in chronic kidney disease: Secondary | ICD-10-CM | POA: Diagnosis not present

## 2018-02-13 DIAGNOSIS — D509 Iron deficiency anemia, unspecified: Secondary | ICD-10-CM | POA: Diagnosis not present

## 2018-02-15 DIAGNOSIS — Z992 Dependence on renal dialysis: Secondary | ICD-10-CM | POA: Diagnosis not present

## 2018-02-15 DIAGNOSIS — N2581 Secondary hyperparathyroidism of renal origin: Secondary | ICD-10-CM | POA: Diagnosis not present

## 2018-02-15 DIAGNOSIS — N186 End stage renal disease: Secondary | ICD-10-CM | POA: Diagnosis not present

## 2018-02-15 DIAGNOSIS — D509 Iron deficiency anemia, unspecified: Secondary | ICD-10-CM | POA: Diagnosis not present

## 2018-02-15 DIAGNOSIS — D631 Anemia in chronic kidney disease: Secondary | ICD-10-CM | POA: Diagnosis not present

## 2018-02-18 DIAGNOSIS — Z992 Dependence on renal dialysis: Secondary | ICD-10-CM | POA: Diagnosis not present

## 2018-02-18 DIAGNOSIS — N186 End stage renal disease: Secondary | ICD-10-CM | POA: Diagnosis not present

## 2018-02-18 DIAGNOSIS — N2581 Secondary hyperparathyroidism of renal origin: Secondary | ICD-10-CM | POA: Diagnosis not present

## 2018-02-18 DIAGNOSIS — D509 Iron deficiency anemia, unspecified: Secondary | ICD-10-CM | POA: Diagnosis not present

## 2018-02-18 DIAGNOSIS — D631 Anemia in chronic kidney disease: Secondary | ICD-10-CM | POA: Diagnosis not present

## 2018-02-20 DIAGNOSIS — D509 Iron deficiency anemia, unspecified: Secondary | ICD-10-CM | POA: Diagnosis not present

## 2018-02-20 DIAGNOSIS — N2581 Secondary hyperparathyroidism of renal origin: Secondary | ICD-10-CM | POA: Diagnosis not present

## 2018-02-20 DIAGNOSIS — D631 Anemia in chronic kidney disease: Secondary | ICD-10-CM | POA: Diagnosis not present

## 2018-02-20 DIAGNOSIS — Z992 Dependence on renal dialysis: Secondary | ICD-10-CM | POA: Diagnosis not present

## 2018-02-20 DIAGNOSIS — N186 End stage renal disease: Secondary | ICD-10-CM | POA: Diagnosis not present

## 2018-02-22 DIAGNOSIS — D631 Anemia in chronic kidney disease: Secondary | ICD-10-CM | POA: Diagnosis not present

## 2018-02-22 DIAGNOSIS — D509 Iron deficiency anemia, unspecified: Secondary | ICD-10-CM | POA: Diagnosis not present

## 2018-02-22 DIAGNOSIS — Z992 Dependence on renal dialysis: Secondary | ICD-10-CM | POA: Diagnosis not present

## 2018-02-22 DIAGNOSIS — N2581 Secondary hyperparathyroidism of renal origin: Secondary | ICD-10-CM | POA: Diagnosis not present

## 2018-02-22 DIAGNOSIS — N186 End stage renal disease: Secondary | ICD-10-CM | POA: Diagnosis not present

## 2018-02-25 DIAGNOSIS — D509 Iron deficiency anemia, unspecified: Secondary | ICD-10-CM | POA: Diagnosis not present

## 2018-02-25 DIAGNOSIS — Z992 Dependence on renal dialysis: Secondary | ICD-10-CM | POA: Diagnosis not present

## 2018-02-25 DIAGNOSIS — N186 End stage renal disease: Secondary | ICD-10-CM | POA: Diagnosis not present

## 2018-02-25 DIAGNOSIS — N2581 Secondary hyperparathyroidism of renal origin: Secondary | ICD-10-CM | POA: Diagnosis not present

## 2018-03-01 DIAGNOSIS — N186 End stage renal disease: Secondary | ICD-10-CM | POA: Diagnosis not present

## 2018-03-01 DIAGNOSIS — D509 Iron deficiency anemia, unspecified: Secondary | ICD-10-CM | POA: Diagnosis not present

## 2018-03-01 DIAGNOSIS — Z992 Dependence on renal dialysis: Secondary | ICD-10-CM | POA: Diagnosis not present

## 2018-03-01 DIAGNOSIS — N2581 Secondary hyperparathyroidism of renal origin: Secondary | ICD-10-CM | POA: Diagnosis not present

## 2018-03-04 DIAGNOSIS — Z79899 Other long term (current) drug therapy: Secondary | ICD-10-CM | POA: Diagnosis not present

## 2018-03-04 DIAGNOSIS — S0990XA Unspecified injury of head, initial encounter: Secondary | ICD-10-CM | POA: Diagnosis not present

## 2018-03-04 DIAGNOSIS — E1129 Type 2 diabetes mellitus with other diabetic kidney complication: Secondary | ICD-10-CM | POA: Diagnosis not present

## 2018-03-04 DIAGNOSIS — Z66 Do not resuscitate: Secondary | ICD-10-CM | POA: Diagnosis present

## 2018-03-04 DIAGNOSIS — I959 Hypotension, unspecified: Secondary | ICD-10-CM | POA: Diagnosis not present

## 2018-03-04 DIAGNOSIS — L03115 Cellulitis of right lower limb: Secondary | ICD-10-CM | POA: Diagnosis present

## 2018-03-04 DIAGNOSIS — Z8673 Personal history of transient ischemic attack (TIA), and cerebral infarction without residual deficits: Secondary | ICD-10-CM | POA: Diagnosis not present

## 2018-03-04 DIAGNOSIS — S3992XA Unspecified injury of lower back, initial encounter: Secondary | ICD-10-CM | POA: Diagnosis not present

## 2018-03-04 DIAGNOSIS — R404 Transient alteration of awareness: Secondary | ICD-10-CM | POA: Diagnosis not present

## 2018-03-04 DIAGNOSIS — R609 Edema, unspecified: Secondary | ICD-10-CM | POA: Diagnosis not present

## 2018-03-04 DIAGNOSIS — E43 Unspecified severe protein-calorie malnutrition: Secondary | ICD-10-CM | POA: Diagnosis present

## 2018-03-04 DIAGNOSIS — N186 End stage renal disease: Secondary | ICD-10-CM | POA: Diagnosis present

## 2018-03-04 DIAGNOSIS — L03114 Cellulitis of left upper limb: Secondary | ICD-10-CM | POA: Diagnosis not present

## 2018-03-04 DIAGNOSIS — Z955 Presence of coronary angioplasty implant and graft: Secondary | ICD-10-CM | POA: Diagnosis not present

## 2018-03-04 DIAGNOSIS — Z7401 Bed confinement status: Secondary | ICD-10-CM | POA: Diagnosis not present

## 2018-03-04 DIAGNOSIS — I132 Hypertensive heart and chronic kidney disease with heart failure and with stage 5 chronic kidney disease, or end stage renal disease: Secondary | ICD-10-CM | POA: Diagnosis present

## 2018-03-04 DIAGNOSIS — S20229A Contusion of unspecified back wall of thorax, initial encounter: Secondary | ICD-10-CM | POA: Diagnosis not present

## 2018-03-04 DIAGNOSIS — L03319 Cellulitis of trunk, unspecified: Secondary | ICD-10-CM | POA: Diagnosis present

## 2018-03-04 DIAGNOSIS — F0150 Vascular dementia without behavioral disturbance: Secondary | ICD-10-CM | POA: Diagnosis present

## 2018-03-04 DIAGNOSIS — S3993XA Unspecified injury of pelvis, initial encounter: Secondary | ICD-10-CM | POA: Diagnosis not present

## 2018-03-04 DIAGNOSIS — L03116 Cellulitis of left lower limb: Secondary | ICD-10-CM | POA: Diagnosis present

## 2018-03-04 DIAGNOSIS — Z7952 Long term (current) use of systemic steroids: Secondary | ICD-10-CM | POA: Diagnosis not present

## 2018-03-04 DIAGNOSIS — S299XXA Unspecified injury of thorax, initial encounter: Secondary | ICD-10-CM | POA: Diagnosis not present

## 2018-03-04 DIAGNOSIS — E1122 Type 2 diabetes mellitus with diabetic chronic kidney disease: Secondary | ICD-10-CM | POA: Diagnosis present

## 2018-03-04 DIAGNOSIS — W1839XA Other fall on same level, initial encounter: Secondary | ICD-10-CM | POA: Diagnosis not present

## 2018-03-04 DIAGNOSIS — I5032 Chronic diastolic (congestive) heart failure: Secondary | ICD-10-CM | POA: Diagnosis present

## 2018-03-04 DIAGNOSIS — R51 Headache: Secondary | ICD-10-CM | POA: Diagnosis not present

## 2018-03-04 DIAGNOSIS — B958 Unspecified staphylococcus as the cause of diseases classified elsewhere: Secondary | ICD-10-CM | POA: Diagnosis present

## 2018-03-04 DIAGNOSIS — Z9181 History of falling: Secondary | ICD-10-CM | POA: Diagnosis not present

## 2018-03-04 DIAGNOSIS — Z992 Dependence on renal dialysis: Secondary | ICD-10-CM | POA: Diagnosis not present

## 2018-03-04 DIAGNOSIS — I482 Chronic atrial fibrillation, unspecified: Secondary | ICD-10-CM | POA: Diagnosis present

## 2018-03-04 DIAGNOSIS — Z87891 Personal history of nicotine dependence: Secondary | ICD-10-CM | POA: Diagnosis not present

## 2018-03-04 DIAGNOSIS — R410 Disorientation, unspecified: Secondary | ICD-10-CM | POA: Diagnosis not present

## 2018-03-04 DIAGNOSIS — L03113 Cellulitis of right upper limb: Secondary | ICD-10-CM | POA: Diagnosis present

## 2018-03-04 DIAGNOSIS — W19XXXA Unspecified fall, initial encounter: Secondary | ICD-10-CM | POA: Diagnosis not present

## 2018-03-04 DIAGNOSIS — R079 Chest pain, unspecified: Secondary | ICD-10-CM | POA: Diagnosis not present

## 2018-03-04 DIAGNOSIS — Z681 Body mass index (BMI) 19 or less, adult: Secondary | ICD-10-CM | POA: Diagnosis not present

## 2018-03-04 DIAGNOSIS — Z7982 Long term (current) use of aspirin: Secondary | ICD-10-CM | POA: Diagnosis not present

## 2018-03-04 DIAGNOSIS — I10 Essential (primary) hypertension: Secondary | ICD-10-CM | POA: Diagnosis not present

## 2018-03-04 DIAGNOSIS — R402411 Glasgow coma scale score 13-15, in the field [EMT or ambulance]: Secondary | ICD-10-CM | POA: Diagnosis not present

## 2018-03-04 DIAGNOSIS — L03818 Cellulitis of other sites: Secondary | ICD-10-CM | POA: Diagnosis not present

## 2018-03-04 DIAGNOSIS — Z794 Long term (current) use of insulin: Secondary | ICD-10-CM | POA: Diagnosis not present

## 2018-03-04 DIAGNOSIS — I2510 Atherosclerotic heart disease of native coronary artery without angina pectoris: Secondary | ICD-10-CM | POA: Diagnosis present

## 2018-03-09 DIAGNOSIS — E118 Type 2 diabetes mellitus with unspecified complications: Secondary | ICD-10-CM | POA: Diagnosis not present

## 2018-03-09 DIAGNOSIS — R634 Abnormal weight loss: Secondary | ICD-10-CM | POA: Diagnosis not present

## 2018-03-09 DIAGNOSIS — F0391 Unspecified dementia with behavioral disturbance: Secondary | ICD-10-CM | POA: Diagnosis not present

## 2018-03-09 DIAGNOSIS — Z66 Do not resuscitate: Secondary | ICD-10-CM | POA: Diagnosis not present

## 2018-03-09 DIAGNOSIS — M6281 Muscle weakness (generalized): Secondary | ICD-10-CM | POA: Diagnosis not present

## 2018-03-09 DIAGNOSIS — Z515 Encounter for palliative care: Secondary | ICD-10-CM | POA: Diagnosis not present

## 2018-03-09 DIAGNOSIS — I482 Chronic atrial fibrillation, unspecified: Secondary | ICD-10-CM | POA: Diagnosis not present

## 2018-03-09 DIAGNOSIS — I132 Hypertensive heart and chronic kidney disease with heart failure and with stage 5 chronic kidney disease, or end stage renal disease: Secondary | ICD-10-CM | POA: Diagnosis not present

## 2018-03-10 DIAGNOSIS — I132 Hypertensive heart and chronic kidney disease with heart failure and with stage 5 chronic kidney disease, or end stage renal disease: Secondary | ICD-10-CM | POA: Diagnosis not present

## 2018-03-10 DIAGNOSIS — M6281 Muscle weakness (generalized): Secondary | ICD-10-CM | POA: Diagnosis not present

## 2018-03-10 DIAGNOSIS — E118 Type 2 diabetes mellitus with unspecified complications: Secondary | ICD-10-CM | POA: Diagnosis not present

## 2018-03-10 DIAGNOSIS — I482 Chronic atrial fibrillation, unspecified: Secondary | ICD-10-CM | POA: Diagnosis not present

## 2018-03-10 DIAGNOSIS — F0391 Unspecified dementia with behavioral disturbance: Secondary | ICD-10-CM | POA: Diagnosis not present

## 2018-03-10 DIAGNOSIS — R634 Abnormal weight loss: Secondary | ICD-10-CM | POA: Diagnosis not present

## 2018-03-11 DIAGNOSIS — M6281 Muscle weakness (generalized): Secondary | ICD-10-CM | POA: Diagnosis not present

## 2018-03-11 DIAGNOSIS — R634 Abnormal weight loss: Secondary | ICD-10-CM | POA: Diagnosis not present

## 2018-03-11 DIAGNOSIS — I132 Hypertensive heart and chronic kidney disease with heart failure and with stage 5 chronic kidney disease, or end stage renal disease: Secondary | ICD-10-CM | POA: Diagnosis not present

## 2018-03-11 DIAGNOSIS — I482 Chronic atrial fibrillation, unspecified: Secondary | ICD-10-CM | POA: Diagnosis not present

## 2018-03-11 DIAGNOSIS — E118 Type 2 diabetes mellitus with unspecified complications: Secondary | ICD-10-CM | POA: Diagnosis not present

## 2018-03-11 DIAGNOSIS — F0391 Unspecified dementia with behavioral disturbance: Secondary | ICD-10-CM | POA: Diagnosis not present

## 2018-03-24 DEATH — deceased

## 2018-08-23 ENCOUNTER — Other Ambulatory Visit: Payer: Self-pay

## 2019-02-27 ENCOUNTER — Encounter: Payer: Self-pay | Admitting: Gastroenterology
